# Patient Record
Sex: Female | Born: 1940 | Race: White | Hispanic: No | Smoking: Never smoker
Health system: Southern US, Community
[De-identification: ages and names within clinical notes are randomized; demographics above are authoritative.]

## PROBLEM LIST (undated history)

## (undated) DIAGNOSIS — J189 Pneumonia, unspecified organism: Secondary | ICD-10-CM

## (undated) DIAGNOSIS — C801 Malignant (primary) neoplasm, unspecified: Secondary | ICD-10-CM

## (undated) DIAGNOSIS — E041 Nontoxic single thyroid nodule: Secondary | ICD-10-CM

## (undated) DIAGNOSIS — I509 Heart failure, unspecified: Secondary | ICD-10-CM

## (undated) DIAGNOSIS — M1990 Unspecified osteoarthritis, unspecified site: Secondary | ICD-10-CM

## (undated) DIAGNOSIS — K625 Hemorrhage of anus and rectum: Secondary | ICD-10-CM

## (undated) DIAGNOSIS — F419 Anxiety disorder, unspecified: Secondary | ICD-10-CM

## (undated) DIAGNOSIS — I2721 Secondary pulmonary arterial hypertension: Secondary | ICD-10-CM

## (undated) DIAGNOSIS — J449 Chronic obstructive pulmonary disease, unspecified: Secondary | ICD-10-CM

## (undated) DIAGNOSIS — G4730 Sleep apnea, unspecified: Secondary | ICD-10-CM

## (undated) DIAGNOSIS — D649 Anemia, unspecified: Secondary | ICD-10-CM

## (undated) HISTORY — DX: Malignant (primary) neoplasm, unspecified: C80.1

## (undated) HISTORY — DX: Anemia, unspecified: D64.9

## (undated) HISTORY — DX: Heart failure, unspecified: I50.9

## (undated) HISTORY — DX: Hemorrhage of anus and rectum: K62.5

## (undated) HISTORY — DX: Nontoxic single thyroid nodule: E04.1

## (undated) HISTORY — PX: TUBAL LIGATION: SHX77

## (undated) HISTORY — PX: BASAL CELL CARCINOMA EXCISION: SHX1214

## (undated) HISTORY — DX: Secondary pulmonary arterial hypertension: I27.21

---
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
## 1961-06-05 HISTORY — PX: TONSILLECTOMY: SUR1361

## 1990-06-05 HISTORY — PX: PAROTID GLAND TUMOR EXCISION: SHX5221

## 1996-06-05 HISTORY — PX: CATARACT EXTRACTION: SUR2

## 2002-06-05 HISTORY — PX: REPLACEMENT TOTAL KNEE: SUR1224

## 2005-05-16 ENCOUNTER — Ambulatory Visit: Payer: Self-pay | Admitting: Internal Medicine

## 2006-01-27 ENCOUNTER — Ambulatory Visit: Payer: Self-pay | Admitting: Internal Medicine

## 2006-05-04 ENCOUNTER — Encounter: Payer: Self-pay | Admitting: Internal Medicine

## 2006-05-05 ENCOUNTER — Encounter: Payer: Self-pay | Admitting: Internal Medicine

## 2006-08-22 ENCOUNTER — Ambulatory Visit: Payer: Self-pay | Admitting: Internal Medicine

## 2006-11-12 ENCOUNTER — Ambulatory Visit: Payer: Self-pay | Admitting: Gastroenterology

## 2007-06-06 DIAGNOSIS — I2721 Secondary pulmonary arterial hypertension: Secondary | ICD-10-CM

## 2007-06-06 DIAGNOSIS — I509 Heart failure, unspecified: Secondary | ICD-10-CM

## 2007-06-06 HISTORY — DX: Secondary pulmonary arterial hypertension: I27.21

## 2007-06-06 HISTORY — DX: Heart failure, unspecified: I50.9

## 2007-08-29 ENCOUNTER — Ambulatory Visit: Payer: Self-pay | Admitting: Internal Medicine

## 2007-09-09 ENCOUNTER — Ambulatory Visit: Payer: Self-pay | Admitting: Internal Medicine

## 2007-10-07 ENCOUNTER — Ambulatory Visit: Payer: Self-pay

## 2007-11-19 ENCOUNTER — Ambulatory Visit: Payer: Self-pay | Admitting: General Practice

## 2008-01-10 ENCOUNTER — Other Ambulatory Visit: Payer: Self-pay

## 2008-01-10 ENCOUNTER — Inpatient Hospital Stay: Payer: Self-pay | Admitting: Internal Medicine

## 2008-03-01 ENCOUNTER — Emergency Department: Payer: Self-pay | Admitting: Emergency Medicine

## 2008-03-01 ENCOUNTER — Other Ambulatory Visit: Payer: Self-pay

## 2008-04-05 ENCOUNTER — Ambulatory Visit: Payer: Self-pay | Admitting: Internal Medicine

## 2008-04-07 ENCOUNTER — Ambulatory Visit: Payer: Self-pay | Admitting: Internal Medicine

## 2008-05-05 ENCOUNTER — Ambulatory Visit: Payer: Self-pay | Admitting: Internal Medicine

## 2008-06-05 ENCOUNTER — Ambulatory Visit: Payer: Self-pay | Admitting: Internal Medicine

## 2008-07-06 ENCOUNTER — Ambulatory Visit: Payer: Self-pay | Admitting: Internal Medicine

## 2008-08-03 ENCOUNTER — Ambulatory Visit: Payer: Self-pay | Admitting: Internal Medicine

## 2008-09-03 ENCOUNTER — Ambulatory Visit: Payer: Self-pay | Admitting: Internal Medicine

## 2008-10-03 ENCOUNTER — Ambulatory Visit: Payer: Self-pay | Admitting: Internal Medicine

## 2008-10-05 ENCOUNTER — Ambulatory Visit: Payer: Self-pay | Admitting: Internal Medicine

## 2008-11-03 ENCOUNTER — Ambulatory Visit: Payer: Self-pay | Admitting: Internal Medicine

## 2008-11-17 ENCOUNTER — Ambulatory Visit: Payer: Self-pay | Admitting: Internal Medicine

## 2008-12-03 ENCOUNTER — Ambulatory Visit: Payer: Self-pay | Admitting: Internal Medicine

## 2009-01-03 ENCOUNTER — Ambulatory Visit: Payer: Self-pay | Admitting: Internal Medicine

## 2009-01-12 ENCOUNTER — Ambulatory Visit: Payer: Self-pay | Admitting: Internal Medicine

## 2009-02-03 ENCOUNTER — Ambulatory Visit: Payer: Self-pay | Admitting: Internal Medicine

## 2009-04-05 ENCOUNTER — Ambulatory Visit: Payer: Self-pay | Admitting: Internal Medicine

## 2009-04-05 ENCOUNTER — Ambulatory Visit: Payer: Self-pay | Admitting: Unknown Physician Specialty

## 2009-04-20 ENCOUNTER — Ambulatory Visit: Payer: Self-pay | Admitting: Internal Medicine

## 2009-04-26 ENCOUNTER — Ambulatory Visit: Payer: Self-pay | Admitting: Unknown Physician Specialty

## 2009-04-26 ENCOUNTER — Ambulatory Visit: Payer: Self-pay | Admitting: Internal Medicine

## 2009-05-05 ENCOUNTER — Ambulatory Visit: Payer: Self-pay | Admitting: Internal Medicine

## 2009-05-11 ENCOUNTER — Ambulatory Visit: Payer: Self-pay | Admitting: Internal Medicine

## 2009-06-18 ENCOUNTER — Emergency Department: Payer: Self-pay

## 2009-07-06 ENCOUNTER — Ambulatory Visit: Payer: Self-pay | Admitting: Internal Medicine

## 2009-07-27 ENCOUNTER — Ambulatory Visit: Payer: Self-pay | Admitting: Internal Medicine

## 2009-08-03 ENCOUNTER — Ambulatory Visit: Payer: Self-pay | Admitting: Internal Medicine

## 2009-10-03 ENCOUNTER — Ambulatory Visit: Payer: Self-pay | Admitting: Internal Medicine

## 2009-10-25 ENCOUNTER — Ambulatory Visit: Payer: Self-pay | Admitting: Internal Medicine

## 2009-11-03 ENCOUNTER — Ambulatory Visit: Payer: Self-pay | Admitting: Internal Medicine

## 2009-11-04 ENCOUNTER — Ambulatory Visit: Payer: Self-pay | Admitting: General Practice

## 2009-11-17 ENCOUNTER — Inpatient Hospital Stay: Payer: Self-pay | Admitting: General Practice

## 2009-11-23 ENCOUNTER — Encounter: Payer: Self-pay | Admitting: Internal Medicine

## 2009-12-03 ENCOUNTER — Encounter: Payer: Self-pay | Admitting: Internal Medicine

## 2009-12-04 ENCOUNTER — Emergency Department: Payer: Self-pay | Admitting: Internal Medicine

## 2010-01-04 ENCOUNTER — Ambulatory Visit: Payer: Self-pay | Admitting: General Practice

## 2010-01-13 ENCOUNTER — Other Ambulatory Visit: Payer: Self-pay | Admitting: Specialist

## 2010-01-14 ENCOUNTER — Ambulatory Visit: Payer: Self-pay | Admitting: General Practice

## 2010-01-17 ENCOUNTER — Inpatient Hospital Stay: Payer: Self-pay | Admitting: General Practice

## 2010-01-18 LAB — PATHOLOGY REPORT

## 2010-05-06 ENCOUNTER — Ambulatory Visit: Payer: Self-pay | Admitting: Internal Medicine

## 2010-06-05 ENCOUNTER — Ambulatory Visit: Payer: Self-pay | Admitting: Internal Medicine

## 2010-06-05 HISTORY — PX: CATARACT EXTRACTION: SUR2

## 2010-07-06 ENCOUNTER — Ambulatory Visit: Payer: Self-pay | Admitting: Internal Medicine

## 2010-08-04 ENCOUNTER — Ambulatory Visit: Payer: Self-pay | Admitting: Internal Medicine

## 2010-09-04 ENCOUNTER — Ambulatory Visit: Payer: Self-pay | Admitting: Internal Medicine

## 2010-10-04 ENCOUNTER — Ambulatory Visit: Payer: Self-pay | Admitting: Internal Medicine

## 2010-10-11 ENCOUNTER — Ambulatory Visit: Payer: Self-pay | Admitting: Internal Medicine

## 2010-11-04 ENCOUNTER — Ambulatory Visit: Payer: Self-pay | Admitting: Internal Medicine

## 2010-12-04 ENCOUNTER — Ambulatory Visit: Payer: Self-pay | Admitting: Internal Medicine

## 2010-12-19 ENCOUNTER — Inpatient Hospital Stay: Payer: Self-pay | Admitting: Internal Medicine

## 2011-01-02 ENCOUNTER — Ambulatory Visit: Payer: Self-pay | Admitting: Ophthalmology

## 2011-01-16 ENCOUNTER — Ambulatory Visit: Payer: Self-pay | Admitting: Ophthalmology

## 2011-02-03 ENCOUNTER — Ambulatory Visit: Payer: Self-pay | Admitting: Unknown Physician Specialty

## 2011-02-08 LAB — PATHOLOGY REPORT

## 2011-06-06 DIAGNOSIS — K625 Hemorrhage of anus and rectum: Secondary | ICD-10-CM

## 2011-06-06 HISTORY — PX: LEG AMPUTATION: SHX1105

## 2011-06-06 HISTORY — DX: Hemorrhage of anus and rectum: K62.5

## 2011-10-18 ENCOUNTER — Ambulatory Visit: Payer: Self-pay | Admitting: Internal Medicine

## 2011-11-01 DIAGNOSIS — I890 Lymphedema, not elsewhere classified: Secondary | ICD-10-CM | POA: Insufficient documentation

## 2011-11-28 ENCOUNTER — Encounter: Payer: Self-pay | Admitting: Cardiothoracic Surgery

## 2011-12-04 ENCOUNTER — Encounter: Payer: Self-pay | Admitting: Cardiothoracic Surgery

## 2012-06-05 DIAGNOSIS — Z923 Personal history of irradiation: Secondary | ICD-10-CM

## 2012-06-05 HISTORY — PX: BREAST MAMMOSITE: SHX5264

## 2012-06-05 HISTORY — PX: BREAST SURGERY: SHX581

## 2012-06-05 HISTORY — DX: Personal history of irradiation: Z92.3

## 2012-11-01 ENCOUNTER — Emergency Department: Payer: Self-pay | Admitting: Unknown Physician Specialty

## 2012-11-01 LAB — COMPREHENSIVE METABOLIC PANEL
Anion Gap: 5 — ABNORMAL LOW (ref 7–16)
BUN: 20 mg/dL — ABNORMAL HIGH (ref 7–18)
Bilirubin,Total: 0.3 mg/dL (ref 0.2–1.0)
Co2: 31 mmol/L (ref 21–32)
EGFR (African American): >60
Glucose: 87 mg/dL (ref 65–99)
Potassium: 3.8 mmol/L (ref 3.5–5.1)
SGOT(AST): 18 U/L (ref 15–37)
SGPT (ALT): 14 U/L (ref 12–78)

## 2012-11-01 LAB — CBC
HCT: 26.8 % — ABNORMAL LOW (ref 35.0–47.0)
MCHC: 34.1 g/dL (ref 32.0–36.0)
WBC: 7.2 10*3/uL (ref 3.6–11.0)

## 2012-11-01 LAB — URINALYSIS, COMPLETE
Bilirubin,UR: NEGATIVE
Blood: NEGATIVE
Leukocyte Esterase: NEGATIVE
Nitrite: NEGATIVE
Protein: NEGATIVE
RBC,UR: <1 /HPF (ref 0–5)
Squamous Epithelial: 1

## 2012-11-06 ENCOUNTER — Emergency Department: Payer: Self-pay | Admitting: Emergency Medicine

## 2012-11-06 LAB — COMPREHENSIVE METABOLIC PANEL
BUN: 25 mg/dL — ABNORMAL HIGH (ref 7–18)
Bilirubin,Total: 0.3 mg/dL (ref 0.2–1.0)
Calcium, Total: 8.9 mg/dL (ref 8.5–10.1)
Chloride: 105 mmol/L (ref 98–107)
Co2: 31 mmol/L (ref 21–32)
Creatinine: 0.97 mg/dL (ref 0.60–1.30)
EGFR (African American): >60
Potassium: 4 mmol/L (ref 3.5–5.1)
SGOT(AST): 29 U/L (ref 15–37)
Sodium: 139 mmol/L (ref 136–145)

## 2012-11-06 LAB — CK TOTAL AND CKMB (NOT AT ARMC)
CK, Total: 40 U/L (ref 21–215)
CK-MB: 0.8 ng/mL (ref 0.5–3.6)

## 2012-11-06 LAB — CBC
HCT: 29.6 % — ABNORMAL LOW (ref 35.0–47.0)
MCV: 83 fL (ref 80–100)
Platelet: 290 10*3/uL (ref 150–440)
RBC: 3.57 10*6/uL — ABNORMAL LOW (ref 3.80–5.20)
RDW: 16.3 % — ABNORMAL HIGH (ref 11.5–14.5)
WBC: 9.3 10*3/uL (ref 3.6–11.0)

## 2012-11-06 LAB — PRO B NATRIURETIC PEPTIDE: B-Type Natriuretic Peptide: 465 pg/mL — ABNORMAL HIGH (ref 0–125)

## 2012-12-24 ENCOUNTER — Ambulatory Visit: Payer: Self-pay | Admitting: Internal Medicine

## 2012-12-30 ENCOUNTER — Ambulatory Visit: Payer: Self-pay | Admitting: Internal Medicine

## 2013-01-08 ENCOUNTER — Ambulatory Visit (INDEPENDENT_AMBULATORY_CARE_PROVIDER_SITE_OTHER): Payer: Medicare Other | Admitting: General Surgery

## 2013-01-08 ENCOUNTER — Encounter: Payer: Self-pay | Admitting: General Surgery

## 2013-01-08 VITALS — BP 144/82 | HR 86 | Resp 18 | Ht 60.0 in | Wt 204.0 lb

## 2013-01-08 DIAGNOSIS — N63 Unspecified lump in unspecified breast: Secondary | ICD-10-CM

## 2013-01-08 DIAGNOSIS — R928 Other abnormal and inconclusive findings on diagnostic imaging of breast: Secondary | ICD-10-CM

## 2013-01-08 DIAGNOSIS — C50919 Malignant neoplasm of unspecified site of unspecified female breast: Secondary | ICD-10-CM | POA: Insufficient documentation
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
## 2013-01-08 NOTE — Patient Instructions (Addendum)

## 2013-01-09 ENCOUNTER — Encounter: Payer: Self-pay | Admitting: General Surgery

## 2013-01-09 DIAGNOSIS — R928 Other abnormal and inconclusive findings on diagnostic imaging of breast: Secondary | ICD-10-CM | POA: Insufficient documentation

## 2013-01-10 ENCOUNTER — Telehealth: Payer: Self-pay | Admitting: General Surgery

## 2013-01-10 DIAGNOSIS — C50912 Malignant neoplasm of unspecified site of left female breast: Secondary | ICD-10-CM

## 2013-01-10 NOTE — Telephone Encounter (Signed)
Patient notified that the biopsy of the left breast completed August 6th identified a small breast cancer.  Arrangements made for sit down review of options on Wednesday, August 13th.

## 2013-01-13 ENCOUNTER — Encounter: Payer: Self-pay | Admitting: General Surgery

## 2013-01-14 LAB — HER-2 / NEU, FISH
HER-2/CEP17 Ratio: 1.11
Number of Observers:: 2

## 2013-01-14 LAB — ER/PR,IMMUNOHISTOCHEM,PARAFFIN: Estrogen Receptor IHC: >90 %

## 2013-01-15 ENCOUNTER — Encounter: Payer: Self-pay | Admitting: General Surgery

## 2013-01-15 ENCOUNTER — Ambulatory Visit (INDEPENDENT_AMBULATORY_CARE_PROVIDER_SITE_OTHER): Payer: Medicare Other | Admitting: General Surgery

## 2013-01-15 VITALS — BP 130/80 | HR 82 | Resp 20 | Ht 60.0 in | Wt 204.0 lb

## 2013-01-15 DIAGNOSIS — C50919 Malignant neoplasm of unspecified site of unspecified female breast: Secondary | ICD-10-CM | POA: Insufficient documentation

## 2013-01-15 LAB — PATHOLOGY

## 2013-01-15 NOTE — Patient Instructions (Signed)
Patient to have labs drawn at [facility].
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
## 2013-01-15 NOTE — Progress Notes (Signed)
Patient ID: Melissa Mcdonald, female   DOB: [birth year], 72 y.o.   MRN: [id number]  Patient to have the following labs drawn at [facility]: CEA and CA 27-29.

## 2013-01-16 ENCOUNTER — Telehealth: Payer: Self-pay | Admitting: *Deleted

## 2013-01-16 NOTE — Telephone Encounter (Signed)
Message copied by Currie Paris on Thu Jan 16, 2013  3:54 PM ------      Message from: [place], [place]      Created: Thu Jan 16, 2013  9:11 AM         Please notify the patient that labs are fine.  She should let us know how she would like to proceed.       ----- Message -----         From: [facility] Lab Results In Interface         Sent: 01/16/2013   5:48 AM           To: [name], MD                   ------

## 2013-01-16 NOTE — Telephone Encounter (Signed)
Notified patient as instructed, patient pleased. Patient has decided to have a left lumpectomy. States that the 25th is not good day but otherwise any day is good.

## 2013-01-20 ENCOUNTER — Telehealth: Payer: Self-pay | Admitting: *Deleted

## 2013-01-20 ENCOUNTER — Other Ambulatory Visit: Payer: Self-pay | Admitting: General Surgery

## 2013-01-20 DIAGNOSIS — C50912 Malignant neoplasm of unspecified site of left female breast: Secondary | ICD-10-CM

## 2013-01-20 NOTE — Telephone Encounter (Signed)
Patient's surgery has been scheduled for 01-30-13 at [facility]. She is aware of all instructions and will call the office if she has further questions.

## 2013-01-23 ENCOUNTER — Ambulatory Visit: Payer: Self-pay | Admitting: General Surgery

## 2013-01-30 ENCOUNTER — Ambulatory Visit: Payer: Self-pay | Admitting: General Surgery

## 2013-01-30 DIAGNOSIS — C50419 Malignant neoplasm of upper-outer quadrant of unspecified female breast: Secondary | ICD-10-CM

## 2013-01-30 DIAGNOSIS — C801 Malignant (primary) neoplasm, unspecified: Secondary | ICD-10-CM

## 2013-01-30 HISTORY — DX: Malignant (primary) neoplasm, unspecified: C80.1

## 2013-01-30 HISTORY — PX: BREAST EXCISIONAL BIOPSY: SUR124

## 2013-01-31 ENCOUNTER — Encounter: Payer: Self-pay | Admitting: General Surgery

## 2013-02-05 ENCOUNTER — Ambulatory Visit (INDEPENDENT_AMBULATORY_CARE_PROVIDER_SITE_OTHER): Payer: Medicare Other | Admitting: General Surgery

## 2013-02-05 ENCOUNTER — Encounter: Payer: Self-pay | Admitting: General Surgery

## 2013-02-05 ENCOUNTER — Ambulatory Visit: Payer: Self-pay | Admitting: Radiation Oncology

## 2013-02-05 VITALS — BP 140/84 | HR 80 | Resp 20 | Ht 61.0 in | Wt 204.0 lb

## 2013-02-05 DIAGNOSIS — C50912 Malignant neoplasm of unspecified site of left female breast: Secondary | ICD-10-CM

## 2013-02-05 DIAGNOSIS — C50919 Malignant neoplasm of unspecified site of unspecified female breast: Secondary | ICD-10-CM
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
## 2013-02-05 NOTE — Patient Instructions (Addendum)
Patient to return in 2 weeks for a follow up.   This patient has been scheduled for an appointment with Dr. Rushie Chestnut at the [facility] for 02-06-13 at 2 pm. Patient's daughter is aware of date, time, and instructions.

## 2013-02-06 ENCOUNTER — Ambulatory Visit: Payer: Self-pay | Admitting: Radiation Oncology

## 2013-02-06 ENCOUNTER — Encounter: Payer: Self-pay | Admitting: General Surgery

## 2013-02-07 ENCOUNTER — Encounter: Payer: Self-pay | Admitting: General Surgery

## 2013-02-07 ENCOUNTER — Telehealth: Payer: Self-pay

## 2013-02-07 MED ORDER — CEFADROXIL 500 MG PO CAPS: 500.0000 mg | ORAL_CAPSULE | Freq: Two times a day (BID) | ORAL | Status: DC

## 2013-02-07 NOTE — Addendum Note (Signed)
Addended by: [name] on: 02/07/2013 10:25 AM   Modules accepted: Orders

## 2013-02-07 NOTE — Telephone Encounter (Signed)
Message copied by Sinda Du on Fri Feb 07, 2013  8:45 AM ------      Message from: [place], [place]      Created: Fri Feb 07, 2013  7:20 AM         Please notify the patient that the pathology report showed all margins clear and lymph nodes negative. Will review in detail at follow up appointment.       ----- Message -----         From: Jena Gauss, CMA         Sent: 02/06/2013  11:33 AM           To: [name], MD                   ------

## 2013-02-07 NOTE — Telephone Encounter (Signed)
Notified patient of results as instructed, patient pleased. Discussed follow-up appointments, patient agrees

## 2013-02-11 ENCOUNTER — Telehealth: Payer: Self-pay | Admitting: *Deleted

## 2013-02-11 NOTE — Telephone Encounter (Signed)
Mammosite schedule reviewed with the patient Placement  02-12-13  at [facility] at 10:00 Scan 02-14-13 Treat 02-17-13 to 02-21-13 Aware Toniann Fail has already called her for more details Aware of [facility] and directions reviewed. Aware no showers and to wear her bra while mammosite in place. Concerned about how she is going to get in the bed at night because she uses a board and slides, states she will think about it, will trouble shoot using recliner or asking family to come at night to help. Pt agrees.

## 2013-02-12 ENCOUNTER — Encounter: Payer: Self-pay | Admitting: General Surgery

## 2013-02-12 ENCOUNTER — Ambulatory Visit (INDEPENDENT_AMBULATORY_CARE_PROVIDER_SITE_OTHER): Payer: Medicare Other | Admitting: General Surgery

## 2013-02-12 VITALS — BP 164/96 | HR 78 | Resp 18 | Ht 60.0 in | Wt 204.0 lb

## 2013-02-12 DIAGNOSIS — C50919 Malignant neoplasm of unspecified site of unspecified female breast: Secondary | ICD-10-CM

## 2013-02-12 NOTE — Patient Instructions (Addendum)
Patient care kit given to patient.  Instructed no showers, sponge bath while mammosite in place, take antibiotic. Follow up with [facility] as arranged. Discussed wearing your bra for support at all times.  Follow up at [facility] as scheduled

## 2013-02-12 NOTE — Progress Notes (Signed)
Patient here today for left breast mammosite placement.   The patient has met with the radiation oncologist and is felt to be a candidate for partial breast radiation. The procedure was reviewed. She had some concerns due to mobility issues, but I think that these will be easily managed during her treatment.   Physical examination:  Area of telangectasia between nipple and wide excision site in the upper outer quadrant 3x5 cm of the left breast. No evidence of inflammation or infection.  The procedure was reviewed. She was amenable to proceed. 10 cc of 0.5% Xylocaine with 0.25% Marcaine with 1-200,000 of epinephrine was utilized well tolerated. ChloraPrep was applied to the skin. The seroma cavity was drained of clear serous fluid without odor. The 4-5 cm trial balloon was inflated showing a good buffer. The treatment balloon was placed and inflated with 40 cc of saline/Omnipaque. The final distance between the skin and the balloon was 1.78 cm. Bacitracin was applied to the exit site followed by dry gauze dressing. The procedure was very well tolerated.  The patient will present to the radiation oncology Center for stimulation on Friday, September 12 as previously scheduled.

## 2013-02-14 ENCOUNTER — Encounter: Payer: Self-pay | Admitting: General Surgery

## 2013-02-18 ENCOUNTER — Ambulatory Visit: Payer: Medicare Other | Admitting: General Surgery

## 2013-02-19 ENCOUNTER — Encounter: Payer: Self-pay | Admitting: General Surgery

## 2013-03-05 ENCOUNTER — Ambulatory Visit: Payer: Self-pay | Admitting: Radiation Oncology

## 2013-03-18 ENCOUNTER — Ambulatory Visit (INDEPENDENT_AMBULATORY_CARE_PROVIDER_SITE_OTHER): Payer: Medicare Other | Admitting: General Surgery

## 2013-03-18 ENCOUNTER — Encounter: Payer: Self-pay | Admitting: General Surgery

## 2013-03-18 VITALS — BP 142/76 | HR 76 | Resp 18 | Ht 61.0 in | Wt 201.0 lb

## 2013-03-18 DIAGNOSIS — C50919 Malignant neoplasm of unspecified site of unspecified female breast: Secondary | ICD-10-CM

## 2013-03-18 DIAGNOSIS — C50912 Malignant neoplasm of unspecified site of left female breast: Secondary | ICD-10-CM

## 2013-03-18 MED ORDER — LETROZOLE 2.5 MG PO TABS: 2.5000 mg | ORAL_TABLET | Freq: Every day | ORAL | Status: DC

## 2013-03-18 NOTE — Patient Instructions (Addendum)
Patient to return in March 2015 left mammogram. RX sent .
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
## 2013-03-19 ENCOUNTER — Encounter: Payer: Self-pay | Admitting: General Surgery

## 2013-08-19 ENCOUNTER — Ambulatory Visit: Payer: Self-pay | Admitting: General Surgery

## 2013-08-20 ENCOUNTER — Encounter: Payer: Self-pay | Admitting: General Surgery

## 2013-08-20 ENCOUNTER — Ambulatory Visit: Payer: Self-pay | Admitting: Radiation Oncology

## 2013-08-27 ENCOUNTER — Ambulatory Visit: Payer: Medicare Other | Admitting: General Surgery

## 2013-09-02 ENCOUNTER — Encounter: Payer: Self-pay | Admitting: General Surgery

## 2013-09-02 ENCOUNTER — Ambulatory Visit (INDEPENDENT_AMBULATORY_CARE_PROVIDER_SITE_OTHER): Payer: Medicare Other | Admitting: General Surgery

## 2013-09-02 VITALS — BP 146/78 | HR 76 | Resp 16 | Ht 61.0 in | Wt 201.0 lb

## 2013-09-02 DIAGNOSIS — C50919 Malignant neoplasm of unspecified site of unspecified female breast: Secondary | ICD-10-CM

## 2013-09-02 DIAGNOSIS — C50912 Malignant neoplasm of unspecified site of left female breast: Secondary | ICD-10-CM
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
## 2013-09-02 NOTE — Patient Instructions (Signed)
Patient to return four months bilateral diagnotic mammogram.

## 2013-09-03 ENCOUNTER — Ambulatory Visit: Payer: Self-pay | Admitting: Radiation Oncology

## 2013-10-06 DIAGNOSIS — M1990 Unspecified osteoarthritis, unspecified site: Secondary | ICD-10-CM | POA: Insufficient documentation

## 2013-11-25 DIAGNOSIS — I340 Nonrheumatic mitral (valve) insufficiency: Secondary | ICD-10-CM | POA: Insufficient documentation

## 2013-11-25 DIAGNOSIS — K5730 Diverticulosis of large intestine without perforation or abscess without bleeding: Secondary | ICD-10-CM | POA: Insufficient documentation

## 2013-11-25 DIAGNOSIS — I10 Essential (primary) hypertension: Secondary | ICD-10-CM | POA: Insufficient documentation

## 2013-11-25 DIAGNOSIS — Z96659 Presence of unspecified artificial knee joint: Secondary | ICD-10-CM | POA: Insufficient documentation

## 2013-11-25 DIAGNOSIS — I2789 Other specified pulmonary heart diseases: Secondary | ICD-10-CM | POA: Insufficient documentation

## 2013-11-25 DIAGNOSIS — G589 Mononeuropathy, unspecified: Secondary | ICD-10-CM | POA: Insufficient documentation

## 2013-11-25 DIAGNOSIS — G43909 Migraine, unspecified, not intractable, without status migrainosus: Secondary | ICD-10-CM | POA: Insufficient documentation

## 2013-12-02 ENCOUNTER — Observation Stay: Payer: Self-pay | Admitting: Student

## 2013-12-02 LAB — COMPREHENSIVE METABOLIC PANEL
ALT: 16 U/L (ref 12–78)
AST: 18 U/L (ref 15–37)
Albumin: 3.4 g/dL (ref 3.4–5.0)
Alkaline Phosphatase: 80 U/L
Anion Gap: 3 — ABNORMAL LOW (ref 7–16)
BUN: 19 mg/dL — ABNORMAL HIGH (ref 7–18)
Bilirubin,Total: 0.4 mg/dL (ref 0.2–1.0)
Calcium, Total: 9 mg/dL (ref 8.5–10.1)
Chloride: 102 mmol/L (ref 98–107)
Co2: 33 mmol/L — ABNORMAL HIGH (ref 21–32)
Creatinine: 0.98 mg/dL (ref 0.60–1.30)
EGFR (Non-African Amer.): 58 — ABNORMAL LOW
Glucose: 91 mg/dL (ref 65–99)
Osmolality: 278 (ref 275–301)
Potassium: 3.5 mmol/L (ref 3.5–5.1)
SODIUM: 138 mmol/L (ref 136–145)
Total Protein: 7.1 g/dL (ref 6.4–8.2)

## 2013-12-02 LAB — CBC WITH DIFFERENTIAL/PLATELET
BASOS ABS: 0.1 10*3/uL (ref 0.0–0.1)
Basophil %: 0.8 %
Eosinophil #: 0.7 10*3/uL (ref 0.0–0.7)
Eosinophil %: 9.2 %
HCT: 28.5 % — AB (ref 35.0–47.0)
HGB: 9 g/dL — ABNORMAL LOW (ref 12.0–16.0)
LYMPHS ABS: 1.5 10*3/uL (ref 1.0–3.6)
Lymphocyte %: 19.8 %
MCH: 25.4 pg — AB (ref 26.0–34.0)
MCHC: 31.6 g/dL — AB (ref 32.0–36.0)
MCV: 81 fL (ref 80–100)
MONO ABS: 0.6 x10 3/mm (ref 0.2–0.9)
MONOS PCT: 7.5 %
NEUTROS ABS: 4.9 10*3/uL (ref 1.4–6.5)
NEUTROS PCT: 62.7 %
Platelet: 271 10*3/uL (ref 150–440)
RBC: 3.55 10*6/uL — ABNORMAL LOW (ref 3.80–5.20)
RDW: 17 % — ABNORMAL HIGH (ref 11.5–14.5)
WBC: 7.7 10*3/uL (ref 3.6–11.0)

## 2013-12-02 LAB — SEDIMENTATION RATE: Erythrocyte Sed Rate: 66 mm/hr — ABNORMAL HIGH (ref 0–30)

## 2013-12-03 ENCOUNTER — Encounter: Payer: Self-pay | Admitting: Internal Medicine

## 2013-12-03 HISTORY — PX: JOINT REPLACEMENT: SHX530

## 2013-12-03 LAB — BASIC METABOLIC PANEL
ANION GAP: 6 — AB (ref 7–16)
BUN: 20 mg/dL — ABNORMAL HIGH (ref 7–18)
Calcium, Total: 8.7 mg/dL (ref 8.5–10.1)
Chloride: 102 mmol/L (ref 98–107)
Co2: 33 mmol/L — ABNORMAL HIGH (ref 21–32)
Creatinine: 1.03 mg/dL (ref 0.60–1.30)
EGFR (Non-African Amer.): 54 — ABNORMAL LOW
GLUCOSE: 109 mg/dL — AB (ref 65–99)
Osmolality: 284 (ref 275–301)
Potassium: 3.6 mmol/L (ref 3.5–5.1)
SODIUM: 141 mmol/L (ref 136–145)

## 2013-12-03 LAB — CBC WITH DIFFERENTIAL/PLATELET
Basophil #: 0.1 10*3/uL (ref 0.0–0.1)
Basophil %: 0.8 %
EOS PCT: 10.7 %
Eosinophil #: 0.8 10*3/uL — ABNORMAL HIGH (ref 0.0–0.7)
HCT: 26.4 % — AB (ref 35.0–47.0)
HGB: 8.3 g/dL — ABNORMAL LOW (ref 12.0–16.0)
LYMPHS PCT: 20.9 %
Lymphocyte #: 1.5 10*3/uL (ref 1.0–3.6)
MCH: 25.7 pg — AB (ref 26.0–34.0)
MCHC: 31.6 g/dL — ABNORMAL LOW (ref 32.0–36.0)
MCV: 81 fL (ref 80–100)
MONOS PCT: 7.5 %
Monocyte #: 0.5 x10 3/mm (ref 0.2–0.9)
NEUTROS ABS: 4.4 10*3/uL (ref 1.4–6.5)
Neutrophil %: 60.1 %
PLATELETS: 239 10*3/uL (ref 150–440)
RBC: 3.25 10*6/uL — AB (ref 3.80–5.20)
RDW: 16.4 % — ABNORMAL HIGH (ref 11.5–14.5)
WBC: 7.4 10*3/uL (ref 3.6–11.0)

## 2013-12-03 LAB — FERRITIN: Ferritin (ARMC): 19 ng/mL (ref 8–388)

## 2013-12-03 LAB — IRON AND TIBC
IRON SATURATION: 11 %
Iron Bind.Cap.(Total): 306 ug/dL (ref 250–450)
Iron: 35 ug/dL — ABNORMAL LOW (ref 50–170)
Unbound Iron-Bind.Cap.: 271 ug/dL

## 2013-12-03 LAB — HEMOGLOBIN: HGB: 8.7 g/dL — ABNORMAL LOW (ref 12.0–16.0)

## 2013-12-04 LAB — HEMATOCRIT: HCT: 26.2 % — ABNORMAL LOW (ref 35.0–47.0)

## 2013-12-05 DIAGNOSIS — D509 Iron deficiency anemia, unspecified: Secondary | ICD-10-CM | POA: Insufficient documentation

## 2013-12-05 LAB — CBC WITH DIFFERENTIAL/PLATELET
BASOS ABS: 0 10*3/uL (ref 0.0–0.1)
BASOS PCT: 0.6 %
EOS ABS: 0.1 10*3/uL (ref 0.0–0.7)
EOS PCT: 1.1 %
HCT: 25.3 % — ABNORMAL LOW (ref 35.0–47.0)
HGB: 8.4 g/dL — ABNORMAL LOW (ref 12.0–16.0)
LYMPHS ABS: 1.6 10*3/uL (ref 1.0–3.6)
Lymphocyte %: 19.5 %
MCH: 26.3 pg (ref 26.0–34.0)
MCHC: 33.3 g/dL (ref 32.0–36.0)
MCV: 79 fL — AB (ref 80–100)
Monocyte #: 0.5 x10 3/mm (ref 0.2–0.9)
Monocyte %: 6.7 %
NEUTROS PCT: 72.1 %
Neutrophil #: 5.8 10*3/uL (ref 1.4–6.5)
Platelet: 236 10*3/uL (ref 150–440)
RBC: 3.2 10*6/uL — AB (ref 3.80–5.20)
RDW: 16.7 % — AB (ref 11.5–14.5)
WBC: 8.1 10*3/uL (ref 3.6–11.0)

## 2013-12-05 LAB — HEMOGLOBIN: HGB: 9.2 g/dL — ABNORMAL LOW (ref 12.0–16.0)

## 2013-12-07 ENCOUNTER — Ambulatory Visit (HOSPITAL_COMMUNITY)
Admission: AD | Admit: 2013-12-07 | Discharge: 2013-12-07 | Disposition: A | Discharge status: Home / Self Care | Payer: Self-pay | Source: Other Acute Inpatient Hospital | Attending: Internal Medicine | Admitting: Internal Medicine

## 2013-12-07 DIAGNOSIS — S82899A Other fracture of unspecified lower leg, initial encounter for closed fracture: Secondary | ICD-10-CM | POA: Insufficient documentation

## 2013-12-07 DIAGNOSIS — X58XXXA Exposure to other specified factors, initial encounter: Secondary | ICD-10-CM | POA: Insufficient documentation

## 2013-12-19 DIAGNOSIS — Z9889 Other specified postprocedural states: Secondary | ICD-10-CM | POA: Insufficient documentation

## 2014-01-20 ENCOUNTER — Ambulatory Visit: Payer: Medicare Other | Admitting: General Surgery

## 2014-03-30 ENCOUNTER — Encounter: Payer: Self-pay | Admitting: General Surgery

## 2014-03-30 ENCOUNTER — Ambulatory Visit: Payer: Self-pay | Admitting: General Surgery

## 2014-04-01 ENCOUNTER — Ambulatory Visit (INDEPENDENT_AMBULATORY_CARE_PROVIDER_SITE_OTHER): Payer: Medicare Other | Admitting: General Surgery

## 2014-04-01 ENCOUNTER — Encounter: Payer: Self-pay | Admitting: General Surgery

## 2014-04-01 VITALS — BP 132/92 | HR 82 | Resp 16

## 2014-04-01 DIAGNOSIS — C50912 Malignant neoplasm of unspecified site of left female breast: Secondary | ICD-10-CM
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
## 2014-04-01 NOTE — Patient Instructions (Addendum)
The patient is aware to call back for any questions or concerns. Continue self breast exams. Call office for any new breast issues or concerns. Follow up in one year with bilateral diagnostic mammograms and office visit.

## 2014-04-03 ENCOUNTER — Encounter: Payer: Self-pay | Admitting: General Surgery

## 2014-04-06 ENCOUNTER — Encounter: Payer: Self-pay | Admitting: General Surgery

## 2014-08-28 ENCOUNTER — Ambulatory Visit
Admit: 2014-08-28 | Disposition: A | Discharge status: Home / Self Care | Payer: Self-pay | Attending: Radiation Oncology | Admitting: Radiation Oncology

## 2014-09-04 ENCOUNTER — Ambulatory Visit
Admit: 2014-09-04 | Disposition: A | Discharge status: Home / Self Care | Payer: Self-pay | Attending: Radiation Oncology | Admitting: Radiation Oncology

## 2014-09-04 ENCOUNTER — Ambulatory Visit
Admit: 2014-09-04 | Disposition: A | Discharge status: Home / Self Care | Payer: Self-pay | Attending: Hematology and Oncology | Admitting: Hematology and Oncology

## 2014-09-17 LAB — IRON AND TIBC
Iron Bind.Cap.(Total): 349 (ref 250–450)
Iron Saturation: 8
Iron: 28 ug/dL
Unbound Iron-Bind.Cap.: 320.7

## 2014-09-17 LAB — COMPREHENSIVE METABOLIC PANEL
Albumin: 3.7 g/dL
Alkaline Phosphatase: 72 U/L
Anion Gap: 7 (ref 7–16)
BUN: 20 mg/dL
Bilirubin,Total: 0.6 mg/dL
Calcium, Total: 8.9 mg/dL
Chloride: 102 mmol/L
Co2: 29 mmol/L
Creatinine: 0.84 mg/dL
EGFR (African American): >60
EGFR (Non-African Amer.): >60
Glucose: 92 mg/dL
Potassium: 3.9 mmol/L
SGOT(AST): 16 U/L
SGPT (ALT): 12 U/L — ABNORMAL LOW
Sodium: 138 mmol/L
Total Protein: 6.9 g/dL

## 2014-09-17 LAB — CBC CANCER CENTER
Bands: 2 %
Eosinophil: 1 %
HCT: 33.4 % — ABNORMAL LOW (ref 35.0–47.0)
HGB: 11 g/dL — ABNORMAL LOW (ref 12.0–16.0)
Lymphocytes: 27 %
MCH: 28.2 pg (ref 26.0–34.0)
MCHC: 33 g/dL (ref 32.0–36.0)
MCV: 85 fL (ref 80–100)
Monocytes: 7 %
Platelet: 229 x10 3/mm (ref 150–440)
RBC: 3.91 10*6/uL (ref 3.80–5.20)
RDW: 15.3 % — ABNORMAL HIGH (ref 11.5–14.5)
Segmented Neutrophils: 63 %
WBC: 8.2 x10 3/mm (ref 3.6–11.0)

## 2014-09-17 LAB — SEDIMENTATION RATE: Erythrocyte Sed Rate: 56 mm/h — ABNORMAL HIGH

## 2014-09-17 LAB — RETICULOCYTES
Absolute Retic Count: 0.0579 10*6/uL (ref 0.019–0.186)
Reticulocyte: 1.48 % (ref 0.4–3.1)

## 2014-09-17 LAB — FERRITIN: Ferritin (ARMC): 15 ng/mL

## 2014-09-25 NOTE — Op Note (Signed)
PATIENT NAME:  Melissa Mcdonald, Melissa Mcdonald MR#:  [id number] DATE OF BIRTH:  02-19-1941  DATE OF PROCEDURE:  01/30/2013  PREOPERATIVE DIAGNOSIS: Left breast cancer.   POSTOPERATIVE DIAGNOSIS: Left breast cancer.    OPERATIVE PROCEDURE: Wide local excision, mastoplasty, sentinel node biopsy.   SURGEON: Robert Bellow, M.D.  ANESTHESIA: General endotracheal under Dr. Carolin Sicks, Marcaine 0.5% with 1: 200,000 units epinephrine 30 mL local infiltration.   CLINICAL NOTE:  This 74 year old woman was noticed to have a small nodular area in the left breast. 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 Given options for management, the patient desired breast conservation. She underwent injection with technetium sulfur colloid prior to the procedure. She is brought to the operating this time for planned wide excision and sentinel node biopsy.   OPERATIVE NOTE: With the patient under adequate general anesthesia, a total of 4 mL of a dilute mixture of methylene blue diluted 1:2 with normal saline was injected in the subareolar plexus. Ultrasound was used to confirm the location of the biopsy site in the one o'clock position of the left breast after prep with ChloraPrep and draping. Attention was turned to the axilla. The gamma finder showed increased uptake in the lower-third of the axilla. A transverse incision was made after instillation of local anesthesia. The skin and generous layer of adipose tissue was divided. Hemostasis achieved with electrocautery and 3-0 Vicryl ties. Two hot, non-blue lymph nodes were identified. Frozen section report showed no evidence of  macro metastatic disease as reported by Delorse Lek, M.D. While frozen section report was pending, attention was turned to the breast. The lesion was noted to be within 1 cm of the skin and it was elected to excise a 2 x 7 cm crescent of skin overlying the mass to allow the possibility of partial breast radiation and found appropriate. The skin  was incised sharply after the instillation of local anesthetic. Hemostasis was with electrocautery. The tissue was excised down to, but not including the underlying fascia. Specimen was orientated and specimen radiograph confirmed the previously placed clip in the center of the specimen. Gross examination by Dr. Luana Shu suggested tumor on ink at the inferior margin. An additional 1 cm of adipose tissue that appeared grossly normal was excised, orientated and sent fresh for formal examination. The adipose layer was divided approximately 1.5 cm below the skin and mobilized to allow easy approximation. The wound was closed in multiple layers with 2-0 Vicryl figure-of-eight sutures. The skin was then closed with a running 4-0 Vicryl subcuticular suture. A figure-of-eight suture was used in the axillary wound at the deep fascial level and then a running 4-0 Vicryl subcuticular suture for the skin. Dermabond was applied. Due to the patient's generous body habitus, a fluff gauze dressing followed by the application of her own bra was completed.   The patient tolerated the procedure well and was taken to the recovery room in stable condition.     ____________________________ Robert Bellow, MD jwb:nts D: 01/30/2013 18:27:02 ET T: 01/31/2013 03:12:28 ET JOB#: [id number]  cc: Robert Bellow, MD, <Dictator> Adin Hector, MD Matina Rodier Amedeo Kinsman MD ELECTRONICALLY SIGNED 01/31/2013 22:25

## 2014-09-25 NOTE — Consult Note (Signed)
Reason for Visit: This 74 year old Female patient presents to the clinic for initial evaluation of  breast cancer .   Referred by Dr. Hervey Ard.  Diagnosis:  Chief Complaint/Diagnosis   74 year old female with stage IA (T1 C. 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 Hervey Ard underwent needle localization positive for invasive mammary carcinoma strongly ER/PR positive HER-2/neu not overexpressed. A mammogram showing abnormality was performed on December 30, 2012. She underwent a wide local excision and sentinel node biopsy. 1.7 cm grade 2 overall invasive mammary carcinoma was seen. Margins were clear 2.5 cm for invasive component 0.5 mm for DCIS component. There were 5 lymph nodes examined. One had a micrometastatic focus. She is done well postoperatively. She has significant comorbidities including wheelchair-bound oxygen dependency. 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 She also has the significant history for congestive heart failure and pulmonary arterial hypertension. She seen today and for opinion regarding radiation therapy.  Past Hx:    PULMONARY HTN:    Sleep Apnea:    HTN:    Osteoarthritis:    lymphedema:    Diverticulitis:    Anemia:    CHF:    Migraines:    removal of antibiotic spacer, left knee revision: 17-Jan-2010   benign lump removed from neck:    basilcell removed:    Cataract  Extraction:    Tonsillectomy:    Appendectomy:    Knee Replacement - Bilat:   Past, Family and Social History:  Past Medical History positive   Cardiovascular congestive heart failure; hypertension   Respiratory pulmonary hypertension sleep apnea   Gastrointestinal diverticulitis   Neurological/Psychiatric migraine   Past Surgical History appendectomy; basal cell carcinoma removed, cataract extraction, tonsillectomy, 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   Past Medical History Comments osteoarthritis, lymphedema, anemia   Family History positive   Family History Comments mother with stroke and hypertension, father with chronic heart failure and lung disease, sister with ovarian cancer   Social History noncontributory   Allergies:   Aleve: Itching, Swelling  Iron (Ferrous Sulfate): N/V/Diarrhea  [place] Season[place]: N/V/Diarrhea  Protonix: Other  Home Meds:  Home Medications: Medication Instructions Status  acetaminophen-HYDROcodone 325 mg-5 mg oral tablet 1 tab(s) orally every 4 to 6 hours, As needed, pain Active  Lutein 10 mg oral capsule 1 cap(s) orally once a day (in the morning) Active  calcium carbonate 600 mg oral tablet 1 tab(s) orally 2 times a day Active  citalopram 40 mg oral tablet 1 tab(s) orally once a day (in the morning) Active  Fish Oil 1000 mg oral capsule 1 cap(s) orally once a day at lunchtime Active  folic acid 0.4 mg oral tablet 1 tab(s) orally once a day (in the morning) Active  gabapentin 300 mg oral capsule 1 cap(s) orally 3 times a day Active  Glucosamine Chondroitin MSM Complex 1 tab(s) orally once a day (in the morning) Active  ibuprofen 200 mg oral tablet 2 tab(s) orally 3 times a day Active  potassium  chloride 20 mEq oral tablet, extended release 1 tab(s) orally once a day (in the morning) Active  Vitamin B12 500 mcg oral tablet 1 tab(s) orally once a day (in the morning) Active  Vitamin C 500 mg oral tablet 1 tab(s) orally  once a day (in the morning) Active  vitamin E 200 intl units oral capsule 1 cap(s) orally once a day (in the morning) Active  Vitamin D3 1000 intl units oral tablet 1 tab(s) orally once a day (in the morning) Active  Oxygen @ 2L / M continuously  Active  Norco 325 mg-5 mg oral tablet 1-2 tablets by mouth every 4 hours as needed for pain  Active   Review of Systems:  General negative   Performance Status (ECOG) 1   Skin negative   Breast see HPI   Ophthalmologic negative   ENMT negative   Respiratory and Thorax see HPI   Cardiovascular see HPI   Gastrointestinal negative   Genitourinary negative   Musculoskeletal negative   Neurological negative   Psychiatric negative   Hematology/Lymphatics negative   Endocrine negative   Allergic/Immunologic negative   Physical Exam:  General/Skin/HEENT:  Skin normal   Eyes normal   ENMT normal   Head and Neck normal   Additional PE well-developed obese female wheelchair dependent on nasal oxygen. She status post wide local excision left breast which is healing well. Sentinel node incision is also healing well. No dominant mass or nodularity is noted in either breast into position examined. Lungs are clear to A&P cardiac examination shows regular rate and rhythm. She does have significant 2+ edema in her lower extremities bilaterally.   Breasts/Resp/CV/GI/GU:  Respiratory and Thorax normal   Cardiovascular normal   Gastrointestinal normal   Genitourinary normal   MS/Neuro/Psych/Lymph:  Musculoskeletal normal   Neurological normal   Lymphatics normal   Other Results:  Radiology Results: [place]:    28-Jul-14 15:45, US Breast Left  US Breast Left   REASON FOR EXAM:    AV LT NODULAR DENSITY  COMMENTS:       PROCEDURE: [place]  - US BREAST LEFT  - Dec 30 2012  3:45PM     RESULT: Ultrasound left breast in region of mammographic concern reveals   an ill-defined 39m lesion at 7:00 and a ill-defined 9 mm lesion at 8:00    in the left breast. Intramammary lymph nodes also noted in this region.   The 2 ill-defined solid lesions could represent malignancies and surgical   evaluation suggested.    IMPRESSION:  2 small subcentimeter ill-defined lesions upper outer aspect   of the left breast . These are solid and irregular and could represent   malignancy. There is adjacent benign intramammary lymph node. Surgical   evaluation these 2 lesions suggested.  BI-RADS: Category 4 - Suspicious Abnormality - Biopsy Should Be Considered      Thank you for the oppurtunity to contribute to the care of your patient. .        Verified By: TOsa Craver M.D., MD  LabUnknown:    28-Jul-14 14:44, Digital Additional Views Lt Breast ([facility]  PACS Image     28-Jul-14 15:45, [place]  P[place]    28-Jul-14 14:44, Digital Additional Views Lt Breast ([facility])  Digital Additional Views Lt Breast (SCR)   REASON FOR EXAM:    AV LT NODULAR DENSITY  COMMENTS:       PROCEDURE: MAM - MAM DGTL ADD VW LT  SCR  - Dec 30 2012  2:44PM     RESULT: Additional views left breast reveal a persistent area of   ill-defined densities in the upper outer aspect of the left breast   adjacent to what most likely represents a benign intramammary lymph node.   Ultrasound obtained reveals 2 subcentimeter irregular solid lesions in   this region. Surgical evaluation of these 2 lesions is suggested as they   are worrisome for malignancy.    IMPRESSION:  2 ill-defined subcentimeter lesions in the upper outer   aspect left breast which are solid and irregular by ultrasound. Surgical   evaluation suggested of these 2 lesions suggested as they're suspicious     for malignant.    BI-RADS: Category 4 - Suspicious Abnormality - Biopsy Should Be Considered      Thank you for the oppurtunity to contribute to the care of your patient.     BREAST COMPOSITION: The breast composition is SCATTERED FIBROGLANDULAR   TISSUE  (glandular tissue is 25-50%)        Verified By: Osa Craver, M.D., MD   Relevent Results:   Relevant Scans and Labs mammogram and ultrasound reviewed   Assessment and Plan: Impression:   stage IA invasive mammary carcinoma the left breast status post wide local excision and sentinel node biopsy in 74 year old female with significant comorbidities Plan:   even though there is a micrometastatic focus in one of the sentinel lymph nodes believe she is still stage IA disease based on classic staging. I also believe he would be significantly difficult for patient transportation and positioning to treat this patient with standard whole breast radiation. I have recommended and discussed both whole breast radiation and accelerated partial breast radiation. Patient is leaning towards partial breast irradiation based on the convenience and access to care. Would plan on delivering 3400 cGy in 10 fractions at 340 cGy twice a day using high-dose rate remote afterloading with a radium 192. Risks and benefits of accelerated partial breast irradiation were discussed with the patient and she seems to comprehend my treatm plan well. I have contacted Dr. Dellis Filbert Byrnett's office for placement of the MammoSite catheter and will set up treatment planning shortly thereafter.patient will also be a candidate for aromatase inhibitor therapy after completion of radiation.  I would like to take this opportunity to thank you for allowing me to continue to participate in this patient's care.  CC Referral:  cc: Dr. Hervey Ard, Dr. Ramonita Lab   Electronic Signatures: Baruch Gouty, Roda Shutters (MD)  (Signed 04-Sep-14 14:58)  Authored: HPI, Diagnosis, Past Hx, PFSH, Allergies, Home Meds, ROS, Physical Exam, Other Results, Relevent Results, Encounter Assessment and Plan, CC Referring Physician   Last Updated: 04-Sep-14 14:58 by Armstead Peaks (MD)

## 2014-09-26 NOTE — H&P (Signed)
PATIENT NAME:  Melissa Mcdonald, CUMPTON MR#:  [id number] DATE OF BIRTH:  Jul 23, 1940  DATE OF ADMISSION:  12/02/2013  PRIMARY CARE PHYSICIAN: Dr. Ramonita Lab at [facility].   PRIMARY ORTHOPEDIST: Dr. Lyndel Pleasure at [facility].   REFERRING PHYSICIAN: Dr. Conni Slipper.   CHIEF COMPLAINT: Knee pain.   HISTORY OF PRESENT ILLNESS: The patient is a pleasant 74 year old female with a history of bilateral knee replacements.  Of note, she is wheelchair bound. She, of note, also has had a left  above-knee amputation for bone infection per patient and daughter, who is in the room. Of note, the patient also has had increased knee issues on the right knee and was told by Dr. Marry Guan, apparently, that she is unable to bear weight as she is unstable on that knee. She had gone to see Dr. Lyndel Pleasure at [facility], who said the same. She was in a wheelchair mopping. She is on oxygen because of her pulmonary hypertension and chronic respiratory failure, and she states that the wheel from the wheelchair got stuck in her oxygen hosing and that she pushed strongly. She heard a popping sensation in her right knee and felt immediate pain. Came into the hospital where she was found to have dislocated right knee arthroplasty. Here, she was seen by Dr. Rudene Christians who recommended her to follow what her [facility]. [facility] was called and they stated, apparently, that the patient should be placed on a larger immobilizer and discharged home for outpatient follow up with them. She has required increased pain medications, and she is unable to take care of herself at home as she lives by herself.  We were asked to bring her in for observation and pain control.   PAST MEDICAL HISTORY:  1.  Hypertension. 2.  Chronic anemia, saw Dr. Inez Pilgrim in the past. 3.  Obstructive sleep apnea on CPAP. 4.  History of chronic respiratory failure due to pulmonary hypertension per patient. 5.  History of migraines. 6.  Osteoarthritis. 7.  History  of lymphedema of the lower extremities, status post AKA on the left; diverticulosis. 8.  History of moderate to severe mitral regurgitation. 9.  Pulmonary hypertension and probable diastolic congestive heart failure.  10.  History of right breast cancer.   PAST SURGICAL HISTORY:  1.  Bilateral knee replacement.  2.  Left AKA. 3.  Appendectomy. 4.  Tonsillectomy. 5.   Lump resection for parathyroid gland. 6.   Right cataract surgery   ALLERGIES: ALEVE, IRON, PROTONIX, OLD BAY SEASONING.   OUTPATIENT MEDICATIONS:  1.  Acetaminophen/hydrocodone 325/5 mg 1 tablet every 6 hours as needed for pain. 2.  Calcium carbonate 600 mg 2 times a day. 3.  Citalopram 40 mg daily. 4.  Echinacea oral tablet 350 mg once a day. 5.  Fish oil 1000 mg once a day. 6.  Folic acid 1 mg daily. 7.  Furosemide 80 mg once a day. 8.  Gabapentin 300 mg 3 times a day. 9.  Glucosamine chondroitin 1 tablet once a day. 10.  Ibuprofen 200 mg 2 tabs 3 times a day. 11.  Letrozole 2.5 mg once daily. 12.  Lutein 10 mg once a day.  13.  Magnesium oxide 250 mg once a day. 14.  Potassium chloride 20 mEq extended-release once a day. 15.  Vitamin B12 500 mcg daily. 16.  Vitamin C 500 mg daily. 17.  Vitamin D 3000 units 2 tablets once a day. 18.  Vitamin D 200 international units once a day.  SOCIAL HISTORY:  Lives by herself.  She is wheelchair bound. Divorced, retired. No smoking tobacco, alcohol or drug use.   FAMILY HISTORY: Dad had heart disease and was an alcoholic. Mom had stroke. Two brothers with diabetes. Sister with ovarian cancer.   REVIEW OF SYSTEMS:  CONSTITUTIONAL: Weight is steady. No fevers, has chills perhaps once a month. EYES:  No blurry vision or double vision.  ENT: No tinnitus or hearing loss.  RESPIRATORY: Has chronic respiratory failure, oxygen dependent. CARDIOVASCULAR: No chest pain or orthopnea. No swelling in the legs prior.  GASTROINTESTINAL: No nausea, vomiting, diarrhea, bloody stools,  or dark stools.  GENITOURINARY: Denies dysuria, hematuria. HEMATOLOGIC: Has chronic anemia.  SKIN: No rashes.  MUSCULOSKELETAL: Denies knee pain on the right. PSYCHOLOGIC: Denies anxiety or insomnia.  NEUROLOGIC: No focal weakness or numbness.   PHYSICAL EXAMINATION:  VITAL SIGNS: Temperature on arrival 98.1, pulse rate 57, respiratory rate 20, blood pressure 167/88, oxygen saturation 99% on room air, but actually, she was supposed to be on oxygen.  GENERAL:  Patient is an obese female lying in bed in no obvious distress.  HEENT: Normocephalic, atraumatic. Pupils are equal and reactive. Anicteric sclerae. Moist mucous membranes.  NECK: Supple. No thyroid tenderness. No cervical lymphadenopathy.  CARDIOVASCULAR: S1, S2 regular. No significant murmurs.  LUNGS: Clear anteriorly without wheezing or rhonchi.  ABDOMEN: Soft, nontender, nondistended. Positive bowel sounds in all quadrants.  EXTREMITIES: Left AKA and right lower extremities and immobilizer.  NEUROLOGIC: Moves upper extremities independently.  Face is symmetrical. Tongue protrudes midline.  PSYCHIATRIC: Awake, alert, oriented x 3. Pleasant and cooperative.  LABORATORIES AND IMAGING: Femoral x-ray on the right shows the above dislocated right knee arthroplasties discussed. Position and alignment are difficult to assess due to nonstandard projection and rotation.  Polyethylene spacer loose posterior to the distal femur, marked ostial lysis of the distal femur. Differential consideration includes chronic indolent infection or particles disease. Tib-fib on the right. Distal tibia and fibula appears intact. Please refer to the right femoral radiograph for description of complicated right knee arthroplasty.   White count is 7.7, ESR 60 segments, hemoglobin 9, and platelets 271,000. LFTs within normal limits. BUN 19, creatinine 0.98, sodium 138, potassium 3.5.   ASSESSMENT AND PLAN: We have a 74 year old obese, wheelchair dependent female  with chronic respiratory failure on chronic oxygen with right knee complicated dislocation, who we will be admitting for observation and pain management as she was unable to take care of herself due to above. At this point, Dr. Rudene Christians has evaluated the x-rays, and the recommendation was with [facility] transfer, but they have recommended discharge home with an immobilizer and they will see her as an outpatient. It looks like, at this point, she does have an appointment on Thursday at 2:00 per patient who just got a phone call for the above appointments. Will be admitting her for pain control. We will start her on morphine p.r.n., continue her Percocet, obtain a social work consult. The pain, at this time, appears to be controlled with the IV morphine. I would continue her Lasix, oxygen, and the CPAP. Blood pressure is a little up, but I suspect that is secondary to pain. She has chronic anemia, which appears to be at baseline. I would start her on heparin for deep vein thrombosis prophylaxis. The patient is FULL CODE.  TOTAL TIME SPENT: About 40 minutes.     ____________________________ Vivien Presto, MD sa:ts D: 12/02/2013 13:12:41 ET T: 12/02/2013 13:39:53 ET JOB#: [id number]  cc:  Vivien Presto, MD, <Dictator> Adin Hector, MD Vivien Presto MD ELECTRONICALLY SIGNED 12/03/2013 14:10

## 2014-09-26 NOTE — Discharge Summary (Signed)
PATIENT NAME:  Melissa Mcdonald, Melissa Mcdonald MR#:  [id number] DATE OF BIRTH:  21-May-1941  DATE OF ADMISSION:  12/03/2013 DATE OF TRANSFER:  12/07/2013  HISTORY OF PRESENT ILLNESS: The patient is a 74 year old white lady with a history of bilateral knee replacements. She is wheelchair bound as she has had a previous left AKA. The patient was mopping the floor from her wheelchair and felt something pop in her right knee. X-ray showed dislocation of her appliance. She was seen by Dr. Rudene Christians who felt that she should follow-up with her [facility] orthopedics; however, the patient was requiring increased pain medication and was unable to take care of herself at home. She did live alone. She was admitted, therefore, for further observation and pain control.   PAST MEDICAL HISTORY: Notable for: 1.  Hypertension.  2.  Chronic iron deficiency anemia. 3.  Obstructive sleep apnea on CPAP.  4.  History of pulmonary hypertension. 5.  History of migraines.  6.  Osteoarthritis.  7.  History of lymphedema of the lower extremities.  8.  Diverticulosis.  9.  History of severe mitral regurgitation.  10.  History of diastolic congestive heart failure. 11.  History of right breast cancer.   PAST SURGICAL HISTORY: Included: 1.  Bilateral knee replacements. 2.  Left AKA. 3.  Appendectomy.  4.  Tonsillectomy.  5.  Right cataract surgery.  6.  Lump resection of her parathyroid gland.   ALLERGIES: IRON ORALLY, ALEVE, PROTONIX, AND OLD BAY SEASONING.   MEDICATIONS:  At the time of admission: Vicodin 325/5 mg 1 tablet every 6 hours as needed, calcium carbonate 600 mg b.i.d., citalopram 40 mg daily, fish oil 5277 mg daily, folic acid 1 mg daily, furosemide 80 mg daily, gabapentin 300 mg t.i.d., glucosamine/chondroitin 1 tablet daily, ibuprofen 200 mg 2 tablets t.i.d., letrozole 2.5 mg daily, lutein 10 mg daily, Mag-Ox 250 mg daily, potassium chloride 20 mEq once daily, vitamin B12 500 mcg daily, vitamin C 500 mg daily, vitamin D 3000  units 2 tablets daily and Echinacea oral tablets 350 mg daily.   ADMISSION PHYSICAL EXAMINATION:  VITAL SIGNS:  As described by the admitting physician, revealed a temperature 98.1, a pulse rate 57, respiratory rate of 20, blood pressure 167/80, and a pulse oximetry of 99% on room air.  GENERAL: The patient was obese and lying in bed, but in no acute distress. CARDIOVASCULAR: Despite her cardiac history, she had no significant murmurs on exam.  LUNGS: Clear.  ABDOMEN: Soft and nontender.  EXTREMITIES: She was status post left AKA. The right leg was in an immobilizer.  NEUROLOGIC: Felt to be physiological.  PSYCHIATRIC: Grossly normal.   LABORATORY DATA:  The patient's CBC showed hemoglobin of 9 with a hematocrit of 28.5. White count was 7700. Platelet count was 271,000. Admission comprehensive metabolic panel was notable for a BUN of 19 with a creatinine of 0.98. CO2 was 33. Estimated GFR was 58. The remainder of the panel was unremarkable. Sedimentation rate on admission was 66. C-reactive protein was 12.9.   HOSPITAL COURSE: The patient was admitted to the regular medical floor where she was treated for her pain. She was seen in consultation by Dr. Inez Pilgrim because of her anemia. She did drop her hematocrit initially during her hospitalization, but did receive an iron transfusion. At the time of transfer, her hemoglobin was up to 9.2. Arrangements were made by the patient's PCP Dr. Caryl Comes to have the patient transferred to the service of Dr. Elder Negus on Sunday, July 5 for  proposed surgery on Monday July 6. At the time of transfer, the patient was felt to be medically stable.   TRANSFER DIAGNOSIS:  Dislocation of right knee appliance.   ____________________________ Hewitt Blade. Sarina Ser, MD jbw:ts D: 12/06/2013 18:36:45 ET T: 12/06/2013 19:34:20 ET JOB#: [id number]  cc: Jenny Reichmann B. Sarina Ser, MD, <Dictator> Lottie Mussel III MD ELECTRONICALLY SIGNED 12/11/2013 9:15

## 2014-09-26 NOTE — Discharge Summary (Signed)
PATIENT NAME:  Melissa Mcdonald, Melissa Mcdonald MR#:  [id number] DATE OF BIRTH:  1940-06-10  DATE OF ADMISSION:  12/03/2013 DATE OF TRANSFER:  12/07/2013  HISTORY OF PRESENT ILLNESS: The patient is a 74 year old white lady with a history of bilateral knee replacements. She is wheelchair bound as she has had a previous left AKA. The patient was mopping the floor from her wheelchair and felt something pop in her right knee. X-ray showed dislocation of her appliance. She was seen by Dr. Rudene Christians who felt that she should follow-up with her [facility] orthopedics; however, the patient was requiring increased pain medication and was unable to take care of herself at home. She did live alone. She was admitted, therefore, for further observation and pain control.   PAST MEDICAL HISTORY: Notable for: 1.  Hypertension.  2.  Chronic iron deficiency anemia. 3.  Obstructive sleep apnea on CPAP.  4.  History of pulmonary hypertension. 5.  History of migraines.  6.  Osteoarthritis.  7.  History of lymphedema of the lower extremities.  8.  Diverticulosis.  9.  History of severe mitral regurgitation.  10.  History of diastolic congestive heart failure. 11.  History of right breast cancer.   PAST SURGICAL HISTORY: Included: 1.  Bilateral knee replacements. 2.  Left AKA. 3.  Appendectomy.  4.  Tonsillectomy.  5.  Right cataract surgery.  6.  Lump resection of her parathyroid gland.   ALLERGIES: IRON ORALLY, ALEVE, PROTONIX, AND OLD BAY SEASONING.   MEDICATIONS:  At the time of admission: Vicodin 325/5 mg 1 tablet every 6 hours as needed, calcium carbonate 600 mg b.i.d., citalopram 40 mg daily, fish oil 5093 mg daily, folic acid 1 mg daily, furosemide 80 mg daily, gabapentin 300 mg t.i.d., glucosamine/chondroitin 1 tablet daily, ibuprofen 200 mg 2 tablets t.i.d., letrozole 2.5 mg daily, lutein 10 mg daily, Mag-Ox 250 mg daily, potassium chloride 20 mEq once daily, vitamin B12 500 mcg daily, vitamin C 500 mg daily, vitamin D 3000  units 2 tablets daily and Echinacea oral tablets 350 mg daily.   ADMISSION PHYSICAL EXAMINATION:  VITAL SIGNS:  As described by the admitting physician, revealed a temperature 98.1, a pulse rate 57, respiratory rate of 20, blood pressure 167/80, and a pulse oximetry of 99% on room air.  GENERAL: The patient was obese and lying in bed, but in no acute distress. CARDIOVASCULAR: Despite her cardiac history, she had no significant murmurs on exam.  LUNGS: Clear.  ABDOMEN: Soft and nontender.  EXTREMITIES: She was status post left AKA. The right leg was in an immobilizer.  NEUROLOGIC: Felt to be physiological.  PSYCHIATRIC: Grossly normal.   LABORATORY DATA:  The patient's CBC showed hemoglobin of 9 with a hematocrit of 28.5. White count was 7700. Platelet count was 271,000. Admission comprehensive metabolic panel was notable for a BUN of 19 with a creatinine of 0.98. CO2 was 33. Estimated GFR was 58. The remainder of the panel was unremarkable. Sedimentation rate on admission was 66. C-reactive protein was 12.9.   HOSPITAL COURSE: The patient was admitted to the regular medical floor where she was treated for her pain. She was seen in consultation by Dr. Inez Pilgrim because of her anemia. She did drop her hematocrit initially during her hospitalization, but did receive an iron transfusion. At the time of transfer, her hemoglobin was up to 9.2. Arrangements were made by the patient's PCP Dr. Caryl Comes to have the patient transferred to the service of Dr. Elder Negus on Sunday, July 5 for  proposed surgery on Monday July 6. At the time of transfer, the patient was felt to be medically stable.   TRANSFER DIAGNOSIS:  Dislocation of right knee appliance.   ____________________________ Hewitt Blade. Sarina Ser, MD jbw:ts D: 12/06/2013 18:36:45 ET T: 12/06/2013 19:34:20 ET JOB#: [id number]  cc: Jenny Reichmann B. Sarina Ser, MD, <Dictator> Lottie Mussel III MD ELECTRONICALLY SIGNED 12/11/2013 9:15

## 2014-09-26 NOTE — Consult Note (Signed)
Brief Consult Note: Diagnosis: periprosthetic fracture, kne dislocation.   Patient was seen by consultant.   Comments: Discussed with Dr Marry Guan, he recommended transfer to [facility] as there is nothing other than above knee amputation that can be done here for her.  Electronic Signatures: Laurene Footman (MD)  (Signed 30-Jun-15 16:08)  Authored: Brief Consult Note   Last Updated: 30-Jun-15 16:08 by Laurene Footman (MD)

## 2014-09-26 NOTE — Consult Note (Signed)
Brief Consult Note: Diagnosis: DISLOCATED KNEE, CHRONIC IRON DEFICIENCY.   Patient was seen by consultant.   Discussed with Attending MD.   Comments: PATIENT SEEN ON JULY 1, AGAIN TODAY.  DISCUSSED WITH DR Caryl Comes AND DR HOOTEN TODAY, HX CHRONIC IDA, PRIOR IRON REPLACEMENT, PRIOR LOCAL FLARE RX TO IV VENOFER. ALSO EARLY STAGE BREAST CANCER ON AROMATASE INHIBITOR. NO SUSPICION OF BREAST CANCER INVOLVEMENT. HGB SLIGHTLY BELOW PRIOR BASELINE. ASKED TO CONSIDER IV IRON. DISCUSSED WITH PATIENT. POTENTIAL RISK OF VEIN IRRITATION OR ALLEGIC OR SYSTEMIC RX, INCLUDING ALSO BUT NOT LIMITED TO ABDO PAIN, CRAMPING, BACK OR EXT PAIN, HEADACHE NAUSEA , VOMITING, AND MORE RARE BUT POSSIBLE ANAPHYLAXIS. BENEFIT OF BOOSTING HGB  WITH EARLIEST EFFECT IN A FEW DAY, SIGNIFICANT EFFECT WOULD TAKE AT LEAST A WEEK, POSSIBLE REDUCING TRANSFUSION REQUIREMENT IN PERI/POST OP TIME, POSSIBLE IMPROVE STRENGTH, PERFORMANCE, FACILITATE REHAB.Marland KitchenPATIENT WANTS IRON INFUSION.  WILL GIVE FERAHEME WITH PRE MEDS.   RE BREAST CANCER, PATIENT FOLLOWS WITH DR Bary Castilla AND DR Caryl Comes. RECOMMEND YEARLY BONE DENSITY DUE TO EFFECT OF AROMATASE INHIBITOR. IN THE FUTURE PATIENT CAN ALSO FRETURN TO CANCER CENTER PRN IF SHE WANTS TO F/U, AND GIVE MAINTAINANCE FERAHEME.... DICTATED NOTE TO FOLLOW.  Electronic Signatures: Dallas Schimke (MD)  (Signed 02-Jul-15 18:24)  Authored: Brief Consult Note   Last Updated: 02-Jul-15 18:24 by Dallas Schimke (MD)

## 2015-01-12 ENCOUNTER — Other Ambulatory Visit: Payer: Self-pay

## 2015-01-12 DIAGNOSIS — C50912 Malignant neoplasm of unspecified site of left female breast: Secondary | ICD-10-CM

## 2015-03-16 ENCOUNTER — Other Ambulatory Visit: Payer: Self-pay

## 2015-03-16 DIAGNOSIS — C50912 Malignant neoplasm of unspecified site of left female breast: Secondary | ICD-10-CM

## 2015-03-19 ENCOUNTER — Inpatient Hospital Stay: Payer: Medicare Other

## 2015-03-19 ENCOUNTER — Inpatient Hospital Stay: Payer: Medicare Other | Attending: Hematology and Oncology | Admitting: Hematology and Oncology

## 2015-03-19 VITALS — BP 172/85 | HR 64 | Temp 96.9°F | Resp 18

## 2015-03-19 DIAGNOSIS — C50912 Malignant neoplasm of unspecified site of left female breast: Secondary | ICD-10-CM | POA: Insufficient documentation

## 2015-03-19 DIAGNOSIS — Z170 Estrogen receptor positive status [ER+]: Secondary | ICD-10-CM | POA: Diagnosis not present

## 2015-03-19 DIAGNOSIS — I272 Other secondary pulmonary hypertension: Secondary | ICD-10-CM

## 2015-03-19 DIAGNOSIS — Z923 Personal history of irradiation: Secondary | ICD-10-CM | POA: Insufficient documentation

## 2015-03-19 DIAGNOSIS — Z79811 Long term (current) use of aromatase inhibitors: Secondary | ICD-10-CM

## 2015-03-19 DIAGNOSIS — Z8041 Family history of malignant neoplasm of ovary: Secondary | ICD-10-CM

## 2015-03-19 DIAGNOSIS — Z8719 Personal history of other diseases of the digestive system: Secondary | ICD-10-CM

## 2015-03-19 DIAGNOSIS — D509 Iron deficiency anemia, unspecified: Secondary | ICD-10-CM | POA: Diagnosis not present

## 2015-03-19 DIAGNOSIS — Z79899 Other long term (current) drug therapy: Secondary | ICD-10-CM | POA: Diagnosis not present

## 2015-03-19 DIAGNOSIS — I509 Heart failure, unspecified: Secondary | ICD-10-CM | POA: Diagnosis not present

## 2015-03-19 DIAGNOSIS — Z89612 Acquired absence of left leg above knee: Secondary | ICD-10-CM | POA: Insufficient documentation

## 2015-03-19 LAB — COMPREHENSIVE METABOLIC PANEL
ALT: 20 U/L (ref 14–54)
AST: 20 U/L (ref 15–41)
Albumin: 3.6 g/dL (ref 3.5–5.0)
Alkaline Phosphatase: 73 U/L (ref 38–126)
Anion gap: 5 (ref 5–15)
BUN: 19 mg/dL (ref 6–20)
CO2: 29 mmol/L (ref 22–32)
Calcium: 8.5 mg/dL — ABNORMAL LOW (ref 8.9–10.3)
Chloride: 102 mmol/L (ref 101–111)
Creatinine, Ser: 0.96 mg/dL (ref 0.44–1.00)
GFR calc Af Amer: >60 mL/min (ref >60)
GFR calc non Af Amer: 57 mL/min — ABNORMAL LOW (ref >60)
Glucose, Bld: 129 mg/dL — ABNORMAL HIGH (ref 65–99)
Potassium: 3.5 mmol/L (ref 3.5–5.1)
Sodium: 136 mmol/L (ref 135–145)
Total Bilirubin: 0.4 mg/dL (ref 0.3–1.2)
Total Protein: 6.5 g/dL (ref 6.5–8.1)

## 2015-03-19 LAB — IRON AND TIBC
Iron: 56 ug/dL (ref 28–170)
Saturation Ratios: 18 % (ref 10.4–31.8)
TIBC: 321 ug/dL (ref 250–450)
UIBC: 265 ug/dL

## 2015-03-19 LAB — RETICULOCYTES
RBC.: 3.97 MIL/uL (ref 3.80–5.20)
Retic Count, Absolute: 67.5 10*3/uL (ref 19.0–183.0)
Retic Ct Pct: 1.7 % (ref 0.4–3.1)

## 2015-03-19 LAB — SEDIMENTATION RATE: Sed Rate: 56 mm/hr — ABNORMAL HIGH (ref 0–30)

## 2015-03-19 LAB — CBC WITH DIFFERENTIAL/PLATELET
Basophils Absolute: 0.1 10*3/uL (ref 0–0.1)
Basophils Relative: 1 %
Eosinophils Absolute: 0.1 10*3/uL (ref 0–0.7)
Eosinophils Relative: 2 %
HCT: 35.1 % (ref 35.0–47.0)
Hemoglobin: 11.6 g/dL — ABNORMAL LOW (ref 12.0–16.0)
Lymphocytes Relative: 23 %
Lymphs Abs: 1.5 10*3/uL (ref 1.0–3.6)
MCH: 29.1 pg (ref 26.0–34.0)
MCHC: 33 g/dL (ref 32.0–36.0)
MCV: 88.3 fL (ref 80.0–100.0)
Monocytes Absolute: 0.5 10*3/uL (ref 0.2–0.9)
Monocytes Relative: 8 %
Neutro Abs: 4.5 10*3/uL (ref 1.4–6.5)
Neutrophils Relative %: 66 %
Platelets: 209 10*3/uL (ref 150–440)
RBC: 3.97 MIL/uL (ref 3.80–5.20)
RDW: 15 % — ABNORMAL HIGH (ref 11.5–14.5)
WBC: 6.7 10*3/uL (ref 3.6–11.0)

## 2015-03-19 LAB — FERRITIN: Ferritin: 15 ng/mL (ref 11–307)

## 2015-03-19 MED ORDER — ARIPIPRAZOLE 5 MG PO TABS: 5.0000 mg | ORAL_TABLET | Freq: Every day | ORAL | Status: DC

## 2015-03-19 MED ORDER — HYDROCODONE-ACETAMINOPHEN 5-325 MG PO TABS: 2.0000 | ORAL_TABLET | Freq: Four times a day (QID) | ORAL | Status: DC | PRN

## 2015-03-20 LAB — CANCER ANTIGEN 27.29: CA 27.29: 30.4 U/mL (ref 0.0–38.6)

## 2015-04-01 ENCOUNTER — Ambulatory Visit
Admission: RE | Admit: 2015-04-01 | Discharge: 2015-04-01 | Disposition: A | Discharge status: Home / Self Care | Payer: Medicare Other | Source: Ambulatory Visit | Attending: General Surgery | Admitting: General Surgery

## 2015-04-01 ENCOUNTER — Other Ambulatory Visit: Payer: Self-pay | Admitting: General Surgery

## 2015-04-01 ENCOUNTER — Ambulatory Visit
Admission: RE | Admit: 2015-04-01 | Discharge: 2015-04-01 | Disposition: A | Discharge status: Home / Self Care | Payer: Medicare Other | Source: Ambulatory Visit | Attending: Hematology and Oncology | Admitting: Hematology and Oncology

## 2015-04-01 DIAGNOSIS — Z853 Personal history of malignant neoplasm of breast: Secondary | ICD-10-CM | POA: Insufficient documentation

## 2015-04-01 DIAGNOSIS — C50912 Malignant neoplasm of unspecified site of left female breast: Secondary | ICD-10-CM

## 2015-04-13 ENCOUNTER — Ambulatory Visit: Payer: Medicaid Other | Admitting: General Surgery

## 2015-04-21 ENCOUNTER — Encounter: Payer: Self-pay | Admitting: General Surgery

## 2015-04-21 ENCOUNTER — Ambulatory Visit (INDEPENDENT_AMBULATORY_CARE_PROVIDER_SITE_OTHER): Payer: Medicare Other | Admitting: General Surgery

## 2015-04-21 VITALS — BP 134/72 | HR 72 | Resp 14 | Ht 61.0 in | Wt 242.0 lb

## 2015-04-21 DIAGNOSIS — C50912 Malignant neoplasm of unspecified site of left female breast: Secondary | ICD-10-CM | POA: Diagnosis not present
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
## 2015-04-21 NOTE — Patient Instructions (Signed)
The patient has been asked to return to the office in one year with a bilateral diagnostic mammogram. 

## 2015-06-16 ENCOUNTER — Other Ambulatory Visit: Payer: Self-pay

## 2015-06-16 DIAGNOSIS — C50912 Malignant neoplasm of unspecified site of left female breast: Secondary | ICD-10-CM

## 2015-06-16 DIAGNOSIS — D509 Iron deficiency anemia, unspecified: Secondary | ICD-10-CM

## 2015-06-18 ENCOUNTER — Other Ambulatory Visit: Payer: Self-pay

## 2015-06-18 ENCOUNTER — Inpatient Hospital Stay (HOSPITAL_BASED_OUTPATIENT_CLINIC_OR_DEPARTMENT_OTHER): Payer: Medicare Other | Admitting: Hematology and Oncology

## 2015-06-18 ENCOUNTER — Inpatient Hospital Stay: Payer: Medicare Other | Attending: Hematology and Oncology

## 2015-06-18 VITALS — BP 147/81 | HR 64 | Temp 98.6°F | Resp 18 | Ht 61.0 in

## 2015-06-18 DIAGNOSIS — C50912 Malignant neoplasm of unspecified site of left female breast: Secondary | ICD-10-CM

## 2015-06-18 DIAGNOSIS — Z85828 Personal history of other malignant neoplasm of skin: Secondary | ICD-10-CM | POA: Insufficient documentation

## 2015-06-18 DIAGNOSIS — Z79811 Long term (current) use of aromatase inhibitors: Secondary | ICD-10-CM

## 2015-06-18 DIAGNOSIS — Z89612 Acquired absence of left leg above knee: Secondary | ICD-10-CM | POA: Diagnosis not present

## 2015-06-18 DIAGNOSIS — I272 Other secondary pulmonary hypertension: Secondary | ICD-10-CM

## 2015-06-18 DIAGNOSIS — Z8719 Personal history of other diseases of the digestive system: Secondary | ICD-10-CM | POA: Diagnosis not present

## 2015-06-18 DIAGNOSIS — Z170 Estrogen receptor positive status [ER+]: Secondary | ICD-10-CM | POA: Diagnosis not present

## 2015-06-18 DIAGNOSIS — D509 Iron deficiency anemia, unspecified: Secondary | ICD-10-CM | POA: Diagnosis not present

## 2015-06-18 DIAGNOSIS — Z79899 Other long term (current) drug therapy: Secondary | ICD-10-CM

## 2015-06-18 DIAGNOSIS — Z8041 Family history of malignant neoplasm of ovary: Secondary | ICD-10-CM | POA: Insufficient documentation

## 2015-06-18 DIAGNOSIS — I509 Heart failure, unspecified: Secondary | ICD-10-CM | POA: Diagnosis not present

## 2015-06-18 LAB — CBC WITH DIFFERENTIAL/PLATELET
Basophils Absolute: 0 10*3/uL (ref 0–0.1)
Basophils Relative: 1 %
Eosinophils Absolute: 0.2 10*3/uL (ref 0–0.7)
Eosinophils Relative: 2 %
HCT: 33 % — ABNORMAL LOW (ref 35.0–47.0)
Hemoglobin: 10.9 g/dL — ABNORMAL LOW (ref 12.0–16.0)
Lymphocytes Relative: 24 %
Lymphs Abs: 1.9 10*3/uL (ref 1.0–3.6)
MCH: 29.2 pg (ref 26.0–34.0)
MCHC: 33.1 g/dL (ref 32.0–36.0)
MCV: 88.3 fL (ref 80.0–100.0)
Monocytes Absolute: 0.6 10*3/uL (ref 0.2–0.9)
Monocytes Relative: 8 %
Neutro Abs: 5.1 10*3/uL (ref 1.4–6.5)
Neutrophils Relative %: 65 %
Platelets: 212 10*3/uL (ref 150–440)
RBC: 3.73 MIL/uL — ABNORMAL LOW (ref 3.80–5.20)
RDW: 15.3 % — ABNORMAL HIGH (ref 11.5–14.5)
WBC: 7.8 10*3/uL (ref 3.6–11.0)

## 2015-06-18 LAB — IRON AND TIBC
Iron: 33 ug/dL (ref 28–170)
Saturation Ratios: 11 % (ref 10.4–31.8)
TIBC: 305 ug/dL (ref 250–450)
UIBC: 272 ug/dL

## 2015-06-18 LAB — FERRITIN: Ferritin: 17 ng/mL (ref 11–307)
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
## 2015-07-13 ENCOUNTER — Encounter: Payer: Self-pay | Admitting: Hematology and Oncology

## 2015-09-16 ENCOUNTER — Inpatient Hospital Stay: Payer: Medicare Other | Attending: Hematology and Oncology

## 2015-09-16 ENCOUNTER — Inpatient Hospital Stay (HOSPITAL_BASED_OUTPATIENT_CLINIC_OR_DEPARTMENT_OTHER): Payer: Medicare Other | Admitting: Hematology and Oncology

## 2015-09-16 VITALS — BP 129/80 | HR 58 | Temp 96.0°F | Resp 18

## 2015-09-16 DIAGNOSIS — D509 Iron deficiency anemia, unspecified: Secondary | ICD-10-CM

## 2015-09-16 DIAGNOSIS — C50412 Malignant neoplasm of upper-outer quadrant of left female breast: Secondary | ICD-10-CM | POA: Diagnosis not present

## 2015-09-16 DIAGNOSIS — I509 Heart failure, unspecified: Secondary | ICD-10-CM | POA: Diagnosis not present

## 2015-09-16 DIAGNOSIS — Z85828 Personal history of other malignant neoplasm of skin: Secondary | ICD-10-CM | POA: Insufficient documentation

## 2015-09-16 DIAGNOSIS — Z170 Estrogen receptor positive status [ER+]: Secondary | ICD-10-CM | POA: Diagnosis not present

## 2015-09-16 DIAGNOSIS — C50912 Malignant neoplasm of unspecified site of left female breast: Secondary | ICD-10-CM

## 2015-09-16 DIAGNOSIS — I272 Other secondary pulmonary hypertension: Secondary | ICD-10-CM | POA: Diagnosis not present

## 2015-09-16 DIAGNOSIS — Z79811 Long term (current) use of aromatase inhibitors: Secondary | ICD-10-CM | POA: Diagnosis not present

## 2015-09-16 DIAGNOSIS — Z79899 Other long term (current) drug therapy: Secondary | ICD-10-CM | POA: Insufficient documentation

## 2015-09-16 LAB — COMPREHENSIVE METABOLIC PANEL
ALT: 22 U/L (ref 14–54)
AST: 18 U/L (ref 15–41)
Albumin: 3.5 g/dL (ref 3.5–5.0)
Alkaline Phosphatase: 62 U/L (ref 38–126)
Anion gap: 4 — ABNORMAL LOW (ref 5–15)
BUN: 25 mg/dL — ABNORMAL HIGH (ref 6–20)
CO2: 31 mmol/L (ref 22–32)
Calcium: 8.6 mg/dL — ABNORMAL LOW (ref 8.9–10.3)
Chloride: 101 mmol/L (ref 101–111)
Creatinine, Ser: 1.04 mg/dL — ABNORMAL HIGH (ref 0.44–1.00)
GFR calc Af Amer: 60 mL/min — ABNORMAL LOW (ref >60)
GFR calc non Af Amer: 52 mL/min — ABNORMAL LOW (ref >60)
Glucose, Bld: 131 mg/dL — ABNORMAL HIGH (ref 65–99)
Potassium: 3.8 mmol/L (ref 3.5–5.1)
Sodium: 136 mmol/L (ref 135–145)
Total Bilirubin: 0.5 mg/dL (ref 0.3–1.2)
Total Protein: 6.5 g/dL (ref 6.5–8.1)

## 2015-09-16 LAB — CBC WITH DIFFERENTIAL/PLATELET
Basophils Absolute: 0.1 10*3/uL (ref 0–0.1)
Basophils Relative: 1 %
Eosinophils Absolute: 0.2 10*3/uL (ref 0–0.7)
Eosinophils Relative: 3 %
HCT: 33.2 % — ABNORMAL LOW (ref 35.0–47.0)
Hemoglobin: 11.3 g/dL — ABNORMAL LOW (ref 12.0–16.0)
Lymphocytes Relative: 24 %
Lymphs Abs: 1.7 10*3/uL (ref 1.0–3.6)
MCH: 29.9 pg (ref 26.0–34.0)
MCHC: 34.2 g/dL (ref 32.0–36.0)
MCV: 87.6 fL (ref 80.0–100.0)
Monocytes Absolute: 0.6 10*3/uL (ref 0.2–0.9)
Monocytes Relative: 8 %
Neutro Abs: 4.5 10*3/uL (ref 1.4–6.5)
Neutrophils Relative %: 64 %
Platelets: 232 10*3/uL (ref 150–440)
RBC: 3.79 MIL/uL — ABNORMAL LOW (ref 3.80–5.20)
RDW: 16.6 % — ABNORMAL HIGH (ref 11.5–14.5)
WBC: 7 10*3/uL (ref 3.6–11.0)

## 2015-09-16 LAB — FOLATE: Folate: 45 ng/mL (ref >5.9)

## 2015-09-16 LAB — FERRITIN: Ferritin: 21 ng/mL (ref 11–307)

## 2015-09-16 LAB — VITAMIN B12: Vitamin B-12: 613 pg/mL (ref 180–914)

## 2015-09-16 NOTE — Progress Notes (Signed)
States is feeling well. Offers no complaints. 
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
## 2015-09-17 LAB — CANCER ANTIGEN 27.29: CA 27.29: 44.9 U/mL — ABNORMAL HIGH (ref 0.0–38.6)

## 2015-09-19 ENCOUNTER — Encounter: Payer: Self-pay | Admitting: Hematology and Oncology

## 2015-09-20 ENCOUNTER — Telehealth: Payer: Self-pay

## 2015-09-20 NOTE — Telephone Encounter (Signed)
Called Pt per MD to report Ca 27.29 elevated slightly.  Pt coming in on 5/15 for a redraw.  No other concerns noted, pt verbalized an understanding

## 2015-10-18 ENCOUNTER — Inpatient Hospital Stay: Payer: Medicare Other | Attending: Hematology and Oncology

## 2015-10-18 DIAGNOSIS — Z170 Estrogen receptor positive status [ER+]: Secondary | ICD-10-CM | POA: Diagnosis not present

## 2015-10-18 DIAGNOSIS — C50912 Malignant neoplasm of unspecified site of left female breast: Secondary | ICD-10-CM | POA: Diagnosis present

## 2015-10-19 LAB — CANCER ANTIGEN 27.29: CA 27.29: 47.1 U/mL — ABNORMAL HIGH (ref 0.0–38.6)

## 2015-10-21 ENCOUNTER — Telehealth: Payer: Self-pay

## 2015-10-21 ENCOUNTER — Other Ambulatory Visit: Payer: Self-pay | Admitting: *Deleted

## 2015-10-21 DIAGNOSIS — C50912 Malignant neoplasm of unspecified site of left female breast: Secondary | ICD-10-CM

## 2015-10-21 DIAGNOSIS — R978 Other abnormal tumor markers: Secondary | ICD-10-CM

## 2015-10-21 NOTE — Telephone Encounter (Signed)
Called pt per MD.  CA 27.29 still elevated per MD she would like to know if she is in agreement with having a PET scan done.  Pt agrees. I informed pt scheduling would call to set up appt. No other concerns noted.

## 2015-10-28 ENCOUNTER — Ambulatory Visit: Payer: Medicare Other

## 2015-11-03 ENCOUNTER — Encounter
Admission: RE | Admit: 2015-11-03 | Discharge: 2015-11-03 | Disposition: A | Discharge status: Home / Self Care | Payer: Medicare Other | Source: Ambulatory Visit | Attending: Hematology and Oncology | Admitting: Hematology and Oncology

## 2015-11-03 DIAGNOSIS — K118 Other diseases of salivary glands: Secondary | ICD-10-CM | POA: Insufficient documentation

## 2015-11-03 DIAGNOSIS — C50912 Malignant neoplasm of unspecified site of left female breast: Secondary | ICD-10-CM | POA: Insufficient documentation

## 2015-11-03 DIAGNOSIS — R948 Abnormal results of function studies of other organs and systems: Secondary | ICD-10-CM | POA: Insufficient documentation

## 2015-11-03 DIAGNOSIS — R978 Other abnormal tumor markers: Secondary | ICD-10-CM | POA: Diagnosis present

## 2015-11-03 LAB — GLUCOSE, CAPILLARY: Glucose-Capillary: 97 mg/dL (ref 65–99)

## 2015-11-03 MED ORDER — FLUDEOXYGLUCOSE F - 18 (FDG) INJECTION
12.8400 | Freq: Once | INTRAVENOUS | Status: AC | PRN
  Administered 2015-11-03: 12.84 via INTRAVENOUS

## 2015-11-08 ENCOUNTER — Inpatient Hospital Stay: Payer: Medicare Other | Attending: Hematology and Oncology | Admitting: Hematology and Oncology

## 2015-11-08 ENCOUNTER — Telehealth: Payer: Self-pay | Admitting: *Deleted

## 2015-11-08 VITALS — BP 120/66 | HR 61 | Temp 97.7°F | Resp 18 | Wt 250.0 lb

## 2015-11-08 DIAGNOSIS — D509 Iron deficiency anemia, unspecified: Secondary | ICD-10-CM | POA: Diagnosis not present

## 2015-11-08 DIAGNOSIS — R948 Abnormal results of function studies of other organs and systems: Secondary | ICD-10-CM

## 2015-11-08 DIAGNOSIS — Z170 Estrogen receptor positive status [ER+]: Secondary | ICD-10-CM | POA: Diagnosis not present

## 2015-11-08 DIAGNOSIS — I272 Other secondary pulmonary hypertension: Secondary | ICD-10-CM | POA: Insufficient documentation

## 2015-11-08 DIAGNOSIS — Z79899 Other long term (current) drug therapy: Secondary | ICD-10-CM | POA: Insufficient documentation

## 2015-11-08 DIAGNOSIS — R220 Localized swelling, mass and lump, head: Secondary | ICD-10-CM | POA: Insufficient documentation

## 2015-11-08 DIAGNOSIS — Z89612 Acquired absence of left leg above knee: Secondary | ICD-10-CM | POA: Diagnosis not present

## 2015-11-08 DIAGNOSIS — R490 Dysphonia: Secondary | ICD-10-CM | POA: Diagnosis not present

## 2015-11-08 DIAGNOSIS — Z85828 Personal history of other malignant neoplasm of skin: Secondary | ICD-10-CM | POA: Diagnosis not present

## 2015-11-08 DIAGNOSIS — R590 Localized enlarged lymph nodes: Secondary | ICD-10-CM

## 2015-11-08 DIAGNOSIS — C50412 Malignant neoplasm of upper-outer quadrant of left female breast: Secondary | ICD-10-CM | POA: Diagnosis not present

## 2015-11-08 DIAGNOSIS — E041 Nontoxic single thyroid nodule: Secondary | ICD-10-CM | POA: Insufficient documentation

## 2015-11-08 DIAGNOSIS — Z8041 Family history of malignant neoplasm of ovary: Secondary | ICD-10-CM | POA: Insufficient documentation

## 2015-11-08 DIAGNOSIS — I509 Heart failure, unspecified: Secondary | ICD-10-CM | POA: Insufficient documentation

## 2015-11-08 DIAGNOSIS — Z79811 Long term (current) use of aromatase inhibitors: Secondary | ICD-10-CM | POA: Diagnosis not present

## 2015-11-08 DIAGNOSIS — C50912 Malignant neoplasm of unspecified site of left female breast: Secondary | ICD-10-CM

## 2015-11-08 DIAGNOSIS — Z8719 Personal history of other diseases of the digestive system: Secondary | ICD-10-CM | POA: Diagnosis not present

## 2015-11-08 DIAGNOSIS — K118 Other diseases of salivary glands: Secondary | ICD-10-CM

## 2015-11-08 NOTE — Telephone Encounter (Signed)
Called daughter [name] and told her that pt can have u/s on both parotid and a thyroid then proceed with bx of parotid.  The patient will [facility] 6[facility] 10 am arrival and 10:30 u/s and 11 am bx.  She will need to be NPO after midnight. She also needs blood work and I wil lcal [place] to see if they can collect it at [facility] and work out transportation per daughter she has to be transported by [facility]. I will cal lin am
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
## 2015-11-08 NOTE — Progress Notes (Signed)
States is feeling well today. Anxious about receiving results.

## 2015-11-09 ENCOUNTER — Encounter: Payer: Self-pay | Admitting: Hematology and Oncology

## 2015-11-11 ENCOUNTER — Telehealth: Payer: Self-pay

## 2015-11-11 NOTE — Telephone Encounter (Signed)
[facility] and spoke with Whitney about labs needed for pt to be able to have biopsy tomorrow.  Per [employer] she could not see them drawn today.  She was going to look in book and call me back.  At 1510 I called facility back and was placed on hold for 20 minutes no answer.  Hung up called back.  Spoke with Loree Fee again she stated she had to place the labs STAT because they were not done. She hopes they will be in before 5pm.

## 2015-11-12 ENCOUNTER — Ambulatory Visit
Admission: RE | Admit: 2015-11-12 | Discharge: 2015-11-12 | Disposition: A | Discharge status: Home / Self Care | Payer: Medicare Other | Source: Ambulatory Visit | Attending: Hematology and Oncology | Admitting: Hematology and Oncology

## 2015-11-12 DIAGNOSIS — K8020 Calculus of gallbladder without cholecystitis without obstruction: Secondary | ICD-10-CM | POA: Insufficient documentation

## 2015-11-12 DIAGNOSIS — C50912 Malignant neoplasm of unspecified site of left female breast: Secondary | ICD-10-CM | POA: Insufficient documentation

## 2015-11-12 DIAGNOSIS — E042 Nontoxic multinodular goiter: Secondary | ICD-10-CM | POA: Diagnosis not present

## 2015-11-12 DIAGNOSIS — R220 Localized swelling, mass and lump, head: Secondary | ICD-10-CM | POA: Diagnosis not present

## 2015-11-12 DIAGNOSIS — K4090 Unilateral inguinal hernia, without obstruction or gangrene, not specified as recurrent: Secondary | ICD-10-CM | POA: Diagnosis not present

## 2015-11-12 DIAGNOSIS — E041 Nontoxic single thyroid nodule: Secondary | ICD-10-CM | POA: Insufficient documentation

## 2015-11-12 MED ORDER — SODIUM CHLORIDE 0.9 % IV SOLN
INTRAVENOUS | Status: DC
  Administered 2015-11-12: 11:00:00 via INTRAVENOUS

## 2015-11-12 MED ORDER — FENTANYL CITRATE (PF) 100 MCG/2ML IJ SOLN
INTRAMUSCULAR | Status: AC | PRN
  Administered 2015-11-12: 50 ug via INTRAVENOUS

## 2015-11-12 MED ORDER — MIDAZOLAM HCL 2 MG/2ML IJ SOLN
INTRAMUSCULAR | Status: AC | PRN
  Administered 2015-11-12: 1 mg via INTRAVENOUS

## 2015-11-12 NOTE — Procedures (Signed)
L parotid mass Bx 18 g times two No comp/EBL

## 2015-11-12 NOTE — Discharge Instructions (Signed)
Needle Biopsy, Care After °Refer to this sheet in the next few weeks. These instructions provide you with information about caring for yourself after your procedure. Your health care provider may also give you more specific instructions. Your treatment has been planned according to current medical practices, but problems sometimes occur. Call your health care provider if you have any problems or questions after your procedure. °WHAT TO EXPECT AFTER THE PROCEDURE °After your procedure, it is common to have soreness, bruising, or mild pain at the biopsy site. This should go away in a few days. °HOME CARE INSTRUCTIONS °· Rest as directed by your health care provider. °· Take medicines only as directed by your health care provider. °· There are many different ways to close and cover the biopsy site, including stitches (sutures), skin glue, and adhesive strips. Follow your health care provider's instructions about: °¨ Biopsy site care. °¨ Bandage (dressing) changes and removal. °¨ Biopsy site closure removal. °· Check your biopsy site every day for signs of infection. Watch for: °¨ Redness, swelling, or pain. °¨ Fluid, blood, or pus. °SEEK MEDICAL CARE IF: °· You have a fever. °· You have redness, swelling, or pain at the biopsy site that lasts longer than a few days. °· You have fluid, blood, or pus coming from the biopsy site. °· You feel nauseous. °· You vomit. °SEEK IMMEDIATE MEDICAL CARE IF: °· You have shortness of breath. °· You have trouble breathing. °· You have chest pain.   °· You feel dizzy or you faint. °· You have bleeding that does not stop with pressure or a bandage. °· You cough up blood. °· You have pain in your abdomen. °  °This information is not intended to replace advice given to you by your health care provider. Make sure you discuss any questions you have with your health care provider. °  °Document Released: 10/06/2014 Document Reviewed: 10/06/2014 °Elsevier Interactive Patient Education ©2016  Elsevier Inc. ° °
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
## 2015-11-15 LAB — SURGICAL PATHOLOGY

## 2015-11-22 ENCOUNTER — Inpatient Hospital Stay (HOSPITAL_BASED_OUTPATIENT_CLINIC_OR_DEPARTMENT_OTHER): Payer: Medicare Other | Admitting: Hematology and Oncology

## 2015-11-22 ENCOUNTER — Encounter: Payer: Self-pay | Admitting: Hematology and Oncology

## 2015-11-22 VITALS — BP 114/71 | HR 72 | Temp 97.4°F | Resp 16 | Ht 60.0 in | Wt 254.0 lb

## 2015-11-22 DIAGNOSIS — D509 Iron deficiency anemia, unspecified: Secondary | ICD-10-CM

## 2015-11-22 DIAGNOSIS — Z8041 Family history of malignant neoplasm of ovary: Secondary | ICD-10-CM

## 2015-11-22 DIAGNOSIS — R220 Localized swelling, mass and lump, head: Secondary | ICD-10-CM

## 2015-11-22 DIAGNOSIS — Z79811 Long term (current) use of aromatase inhibitors: Secondary | ICD-10-CM | POA: Diagnosis not present

## 2015-11-22 DIAGNOSIS — C50412 Malignant neoplasm of upper-outer quadrant of left female breast: Secondary | ICD-10-CM | POA: Diagnosis not present

## 2015-11-22 DIAGNOSIS — I272 Other secondary pulmonary hypertension: Secondary | ICD-10-CM

## 2015-11-22 DIAGNOSIS — Z89612 Acquired absence of left leg above knee: Secondary | ICD-10-CM

## 2015-11-22 DIAGNOSIS — Z170 Estrogen receptor positive status [ER+]: Secondary | ICD-10-CM

## 2015-11-22 DIAGNOSIS — Z85828 Personal history of other malignant neoplasm of skin: Secondary | ICD-10-CM

## 2015-11-22 DIAGNOSIS — Z79899 Other long term (current) drug therapy: Secondary | ICD-10-CM

## 2015-11-22 DIAGNOSIS — R490 Dysphonia: Secondary | ICD-10-CM

## 2015-11-22 DIAGNOSIS — I509 Heart failure, unspecified: Secondary | ICD-10-CM

## 2015-11-22 DIAGNOSIS — Z8719 Personal history of other diseases of the digestive system: Secondary | ICD-10-CM

## 2015-11-22 DIAGNOSIS — K118 Other diseases of salivary glands: Secondary | ICD-10-CM

## 2015-11-22 DIAGNOSIS — E041 Nontoxic single thyroid nodule: Secondary | ICD-10-CM
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
## 2015-11-22 NOTE — Progress Notes (Signed)
Pt reports having a cough that sometimes she coughs up a small amount of yellow to clear thick mucous. SOB with cough. Reports she has a upper respiratory related to cold rooms at [facility].

## 2015-11-24 ENCOUNTER — Other Ambulatory Visit: Payer: Self-pay | Admitting: *Deleted

## 2015-11-24 DIAGNOSIS — R9389 Abnormal findings on diagnostic imaging of other specified body structures: Secondary | ICD-10-CM

## 2015-11-24 DIAGNOSIS — C50912 Malignant neoplasm of unspecified site of left female breast: Secondary | ICD-10-CM

## 2015-11-25 ENCOUNTER — Telehealth: Payer: Self-pay | Admitting: *Deleted

## 2015-11-25 NOTE — Telephone Encounter (Signed)
I called 6/21 at 5:15 and let Melissa Mcdonald , the pt's daughter to let her know that the date of bx 6[facility] for proc. At 11 am and daughter was agreeable with he date.  She wanted to know if she was going to get sedation or not and will she have a big cut.  i spoke to [place] in specials and she states with thyroid bx. She will get local numbing to the area and small fine gauged needle to get sample.  It has hardly any bleeding. Simple procedure and she will not see the spot that they stick.  Called daughter back and went over it with her and she will let her mom know.  I also made her an appt to come back to cancer center on 7/7 at 10:30.    I then called [facility] and spoke to [place] and gave her the info of bx date 6/30 10:30 to be at [facility] and she does not have to be NPO either.  Then her f/u appt will be 7/7 at 10:30 at cancer center.

## 2015-11-30 ENCOUNTER — Telehealth: Payer: Self-pay | Admitting: *Deleted

## 2015-11-30 NOTE — Telephone Encounter (Signed)
Called to report that she is scheduled for a thyroid bx Friday, but wanted you to be aware that she has pneumonia and is on abx

## 2015-11-30 NOTE — Telephone Encounter (Signed)
  Can we reschedule her thyroid biopsy until she is feeling better?  M

## 2015-12-03 ENCOUNTER — Ambulatory Visit
Admission: RE | Admit: 2015-12-03 | Discharge: 2015-12-03 | Disposition: A | Discharge status: Home / Self Care | Payer: Medicare Other | Source: Ambulatory Visit | Attending: Hematology and Oncology | Admitting: Hematology and Oncology

## 2015-12-03 DIAGNOSIS — E041 Nontoxic single thyroid nodule: Secondary | ICD-10-CM

## 2015-12-03 DIAGNOSIS — R946 Abnormal results of thyroid function studies: Secondary | ICD-10-CM | POA: Insufficient documentation

## 2015-12-03 DIAGNOSIS — C50912 Malignant neoplasm of unspecified site of left female breast: Secondary | ICD-10-CM | POA: Insufficient documentation

## 2015-12-03 DIAGNOSIS — R9389 Abnormal findings on diagnostic imaging of other specified body structures: Secondary | ICD-10-CM

## 2015-12-03 HISTORY — DX: Anxiety disorder, unspecified: F41.9

## 2015-12-03 HISTORY — DX: Pneumonia, unspecified organism: J18.9

## 2015-12-03 HISTORY — DX: Unspecified osteoarthritis, unspecified site: M19.90

## 2015-12-03 HISTORY — DX: Sleep apnea, unspecified: G47.30

## 2015-12-03 HISTORY — DX: Chronic obstructive pulmonary disease, unspecified: J44.9

## 2015-12-03 HISTORY — DX: Nontoxic single thyroid nodule: E04.1

## 2015-12-10 ENCOUNTER — Inpatient Hospital Stay: Payer: Medicare Other | Attending: Hematology and Oncology | Admitting: Hematology and Oncology

## 2015-12-10 ENCOUNTER — Ambulatory Visit: Payer: Medicare Other

## 2015-12-10 ENCOUNTER — Encounter: Payer: Self-pay | Admitting: Hematology and Oncology

## 2015-12-10 VITALS — BP 138/82 | HR 65 | Temp 98.6°F | Ht 60.0 in | Wt 250.0 lb

## 2015-12-10 DIAGNOSIS — J449 Chronic obstructive pulmonary disease, unspecified: Secondary | ICD-10-CM | POA: Diagnosis not present

## 2015-12-10 DIAGNOSIS — E041 Nontoxic single thyroid nodule: Secondary | ICD-10-CM | POA: Insufficient documentation

## 2015-12-10 DIAGNOSIS — Z170 Estrogen receptor positive status [ER+]: Secondary | ICD-10-CM | POA: Diagnosis not present

## 2015-12-10 DIAGNOSIS — Z923 Personal history of irradiation: Secondary | ICD-10-CM | POA: Diagnosis not present

## 2015-12-10 DIAGNOSIS — G4730 Sleep apnea, unspecified: Secondary | ICD-10-CM | POA: Diagnosis not present

## 2015-12-10 DIAGNOSIS — D509 Iron deficiency anemia, unspecified: Secondary | ICD-10-CM | POA: Diagnosis not present

## 2015-12-10 DIAGNOSIS — C50912 Malignant neoplasm of unspecified site of left female breast: Secondary | ICD-10-CM

## 2015-12-10 DIAGNOSIS — M1990 Unspecified osteoarthritis, unspecified site: Secondary | ICD-10-CM

## 2015-12-10 DIAGNOSIS — Z89612 Acquired absence of left leg above knee: Secondary | ICD-10-CM | POA: Diagnosis not present

## 2015-12-10 DIAGNOSIS — Z8041 Family history of malignant neoplasm of ovary: Secondary | ICD-10-CM | POA: Diagnosis not present

## 2015-12-10 DIAGNOSIS — K118 Other diseases of salivary glands: Secondary | ICD-10-CM

## 2015-12-10 DIAGNOSIS — Z79811 Long term (current) use of aromatase inhibitors: Secondary | ICD-10-CM | POA: Insufficient documentation

## 2015-12-10 DIAGNOSIS — C50412 Malignant neoplasm of upper-outer quadrant of left female breast: Secondary | ICD-10-CM | POA: Diagnosis not present

## 2015-12-10 DIAGNOSIS — I509 Heart failure, unspecified: Secondary | ICD-10-CM | POA: Diagnosis not present

## 2015-12-10 DIAGNOSIS — Z79899 Other long term (current) drug therapy: Secondary | ICD-10-CM | POA: Diagnosis not present

## 2015-12-10 DIAGNOSIS — R221 Localized swelling, mass and lump, neck: Secondary | ICD-10-CM | POA: Insufficient documentation

## 2015-12-10 DIAGNOSIS — I272 Other secondary pulmonary hypertension: Secondary | ICD-10-CM | POA: Insufficient documentation

## 2015-12-10 DIAGNOSIS — F419 Anxiety disorder, unspecified: Secondary | ICD-10-CM | POA: Insufficient documentation

## 2015-12-10 LAB — CBC WITH DIFFERENTIAL/PLATELET
Basophils Absolute: 0 10*3/uL (ref 0–0.1)
Basophils Relative: 1 %
Eosinophils Absolute: 0.2 10*3/uL (ref 0–0.7)
Eosinophils Relative: 2 %
HCT: 34.7 % — ABNORMAL LOW (ref 35.0–47.0)
Hemoglobin: 11.8 g/dL — ABNORMAL LOW (ref 12.0–16.0)
Lymphocytes Relative: 22 %
Lymphs Abs: 1.8 10*3/uL (ref 1.0–3.6)
MCH: 30.9 pg (ref 26.0–34.0)
MCHC: 34.1 g/dL (ref 32.0–36.0)
MCV: 90.7 fL (ref 80.0–100.0)
Monocytes Absolute: 0.7 10*3/uL (ref 0.2–0.9)
Monocytes Relative: 9 %
Neutro Abs: 5.5 10*3/uL (ref 1.4–6.5)
Neutrophils Relative %: 66 %
Platelets: 208 10*3/uL (ref 150–440)
RBC: 3.83 MIL/uL (ref 3.80–5.20)
RDW: 15.4 % — ABNORMAL HIGH (ref 11.5–14.5)
WBC: 8.2 10*3/uL (ref 3.6–11.0)

## 2015-12-10 LAB — IRON AND TIBC
Iron: 49 ug/dL (ref 28–170)
Saturation Ratios: 18 % (ref 10.4–31.8)
TIBC: 276 ug/dL (ref 250–450)
UIBC: 227 ug/dL

## 2015-12-10 LAB — FERRITIN: Ferritin: 41 ng/mL (ref 11–307)

## 2015-12-10 NOTE — Progress Notes (Signed)
Patient here for biopsy results. Patient was diagnosed with pneumonia approx. 10 days ago she has completed her abt.
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
## 2015-12-16 ENCOUNTER — Ambulatory Visit: Payer: Medicare Other

## 2015-12-16 ENCOUNTER — Ambulatory Visit: Payer: Medicare Other | Admitting: Hematology and Oncology

## 2015-12-16 ENCOUNTER — Other Ambulatory Visit: Payer: Medicare Other

## 2015-12-31 ENCOUNTER — Encounter: Payer: Self-pay | Admitting: Interventional Radiology

## 2015-12-31 LAB — CYTOLOGY - NON PAP

## 2016-01-03 ENCOUNTER — Other Ambulatory Visit: Payer: Self-pay | Admitting: Otolaryngology

## 2016-01-03 DIAGNOSIS — J3800 Paralysis of vocal cords and larynx, unspecified: Secondary | ICD-10-CM

## 2016-01-07 ENCOUNTER — Ambulatory Visit: Admission: RE | Admit: 2016-01-07 | Payer: Medicare Other | Source: Ambulatory Visit

## 2016-01-07 ENCOUNTER — Ambulatory Visit
Admission: RE | Admit: 2016-01-07 | Discharge: 2016-01-07 | Disposition: A | Discharge status: Home / Self Care | Payer: Medicare Other | Source: Ambulatory Visit | Attending: Otolaryngology | Admitting: Otolaryngology

## 2016-01-07 DIAGNOSIS — J3800 Paralysis of vocal cords and larynx, unspecified: Secondary | ICD-10-CM | POA: Diagnosis not present

## 2016-01-07 DIAGNOSIS — E079 Disorder of thyroid, unspecified: Secondary | ICD-10-CM | POA: Diagnosis present

## 2016-01-07 LAB — POCT I-STAT CREATININE: CREATININE: 1 mg/dL (ref 0.44–1.00)

## 2016-01-07 MED ORDER — IOPAMIDOL (ISOVUE-300) INJECTION 61%
75.0000 mL | Freq: Once | INTRAVENOUS | Status: AC | PRN
  Administered 2016-01-07: 75 mL via INTRAVENOUS

## 2016-01-17 ENCOUNTER — Other Ambulatory Visit: Payer: Self-pay | Admitting: Cardiology

## 2016-01-17 DIAGNOSIS — R0789 Other chest pain: Secondary | ICD-10-CM

## 2016-01-19 ENCOUNTER — Ambulatory Visit
Admission: RE | Admit: 2016-01-19 | Discharge: 2016-01-19 | Disposition: A | Discharge status: Home / Self Care | Payer: Medicare Other | Source: Ambulatory Visit | Attending: Cardiology | Admitting: Cardiology

## 2016-01-19 DIAGNOSIS — R0789 Other chest pain: Secondary | ICD-10-CM | POA: Insufficient documentation

## 2016-01-19 MED ORDER — REGADENOSON 0.4 MG/5ML IV SOLN
0.4000 mg | Freq: Once | INTRAVENOUS | Status: AC
  Administered 2016-01-19: 0.4 mg via INTRAVENOUS

## 2016-01-19 MED ORDER — TECHNETIUM TC 99M TETROFOSMIN IV KIT
13.6700 | PACK | Freq: Once | INTRAVENOUS | Status: AC | PRN
  Administered 2016-01-19: 13.67 via INTRAVENOUS

## 2016-01-19 MED ORDER — TECHNETIUM TC 99M TETROFOSMIN IV KIT
30.0000 | PACK | Freq: Once | INTRAVENOUS | Status: AC | PRN
  Administered 2016-01-19: 29.69 via INTRAVENOUS

## 2016-01-21 LAB — NM MYOCAR MULTI W/SPECT W/WALL MOTION / EF
CHL CUP NUCLEAR SRS: 3
CHL CUP STRESS STAGE 1 SPEED: 0 mph
CHL CUP STRESS STAGE 2 GRADE: 0 %
CHL CUP STRESS STAGE 2 HR: 66 {beats}/min
CHL CUP STRESS STAGE 2 SPEED: 0 mph
CHL CUP STRESS STAGE 3 GRADE: 0 %
CHL CUP STRESS STAGE 4 HR: 87 {beats}/min
CHL CUP STRESS STAGE 4 SPEED: 0 mph
CHL CUP STRESS STAGE 5 DBP: 60 mmHg
CHL CUP STRESS STAGE 5 GRADE: 0 %
CSEPEDS: 0 s
CSEPHR: 61 %
CSEPPMHR: 60 %
Estimated workload: 1 METS
Exercise duration (min): 1 min
LVDIAVOL: 108 mL (ref 46–106)
LVSYSVOL: 41 mL
MPHR: 146 {beats}/min
NUC STRESS TID: 0.94
Peak HR: 89 {beats}/min
Rest HR: 67 {beats}/min
SDS: 4
SSS: 5
Stage 1 Grade: 0 %
Stage 1 HR: 66 {beats}/min
Stage 3 HR: 89 {beats}/min
Stage 3 Speed: 0 mph
Stage 4 Grade: 0 %
Stage 5 HR: 83 {beats}/min
Stage 5 SBP: 165 mmHg
Stage 5 Speed: 0 mph

## 2016-02-02 ENCOUNTER — Other Ambulatory Visit: Payer: Self-pay | Admitting: General Surgery

## 2016-02-02 DIAGNOSIS — C50912 Malignant neoplasm of unspecified site of left female breast: Secondary | ICD-10-CM

## 2016-02-16 ENCOUNTER — Encounter: Payer: Self-pay | Admitting: *Deleted

## 2016-03-17 ENCOUNTER — Inpatient Hospital Stay (HOSPITAL_BASED_OUTPATIENT_CLINIC_OR_DEPARTMENT_OTHER): Payer: Medicare Other | Admitting: Hematology and Oncology

## 2016-03-17 ENCOUNTER — Other Ambulatory Visit: Payer: Self-pay | Admitting: *Deleted

## 2016-03-17 ENCOUNTER — Inpatient Hospital Stay: Payer: Medicare Other | Attending: Hematology and Oncology

## 2016-03-17 VITALS — BP 152/69 | HR 61 | Temp 97.2°F | Resp 18 | Wt 246.0 lb

## 2016-03-17 DIAGNOSIS — I509 Heart failure, unspecified: Secondary | ICD-10-CM

## 2016-03-17 DIAGNOSIS — E041 Nontoxic single thyroid nodule: Secondary | ICD-10-CM

## 2016-03-17 DIAGNOSIS — Z89512 Acquired absence of left leg below knee: Secondary | ICD-10-CM | POA: Diagnosis not present

## 2016-03-17 DIAGNOSIS — D509 Iron deficiency anemia, unspecified: Secondary | ICD-10-CM | POA: Diagnosis not present

## 2016-03-17 DIAGNOSIS — Z170 Estrogen receptor positive status [ER+]: Secondary | ICD-10-CM

## 2016-03-17 DIAGNOSIS — F419 Anxiety disorder, unspecified: Secondary | ICD-10-CM | POA: Diagnosis not present

## 2016-03-17 DIAGNOSIS — I2721 Secondary pulmonary arterial hypertension: Secondary | ICD-10-CM

## 2016-03-17 DIAGNOSIS — Z8701 Personal history of pneumonia (recurrent): Secondary | ICD-10-CM

## 2016-03-17 DIAGNOSIS — K921 Melena: Secondary | ICD-10-CM | POA: Diagnosis not present

## 2016-03-17 DIAGNOSIS — C50412 Malignant neoplasm of upper-outer quadrant of left female breast: Secondary | ICD-10-CM | POA: Diagnosis not present

## 2016-03-17 DIAGNOSIS — Z8041 Family history of malignant neoplasm of ovary: Secondary | ICD-10-CM | POA: Insufficient documentation

## 2016-03-17 DIAGNOSIS — Z79811 Long term (current) use of aromatase inhibitors: Secondary | ICD-10-CM

## 2016-03-17 DIAGNOSIS — Z79899 Other long term (current) drug therapy: Secondary | ICD-10-CM | POA: Insufficient documentation

## 2016-03-17 DIAGNOSIS — J449 Chronic obstructive pulmonary disease, unspecified: Secondary | ICD-10-CM | POA: Diagnosis not present

## 2016-03-17 DIAGNOSIS — Z9012 Acquired absence of left breast and nipple: Secondary | ICD-10-CM | POA: Diagnosis not present

## 2016-03-17 DIAGNOSIS — Z923 Personal history of irradiation: Secondary | ICD-10-CM | POA: Insufficient documentation

## 2016-03-17 DIAGNOSIS — G4730 Sleep apnea, unspecified: Secondary | ICD-10-CM | POA: Insufficient documentation

## 2016-03-17 DIAGNOSIS — C50912 Malignant neoplasm of unspecified site of left female breast: Secondary | ICD-10-CM

## 2016-03-17 LAB — COMPREHENSIVE METABOLIC PANEL
ALT: 18 U/L (ref 14–54)
AST: 19 U/L (ref 15–41)
Albumin: 3.7 g/dL (ref 3.5–5.0)
Alkaline Phosphatase: 57 U/L (ref 38–126)
Anion gap: 9 (ref 5–15)
BUN: 22 mg/dL — ABNORMAL HIGH (ref 6–20)
CO2: 26 mmol/L (ref 22–32)
Calcium: 9.1 mg/dL (ref 8.9–10.3)
Chloride: 101 mmol/L (ref 101–111)
Creatinine, Ser: 0.89 mg/dL (ref 0.44–1.00)
GFR calc Af Amer: >60 mL/min (ref >60)
GFR calc non Af Amer: >60 mL/min (ref >60)
Glucose, Bld: 169 mg/dL — ABNORMAL HIGH (ref 65–99)
Potassium: 4 mmol/L (ref 3.5–5.1)
Sodium: 136 mmol/L (ref 135–145)
Total Bilirubin: 0.6 mg/dL (ref 0.3–1.2)
Total Protein: 6.4 g/dL — ABNORMAL LOW (ref 6.5–8.1)

## 2016-03-17 LAB — CBC WITH DIFFERENTIAL/PLATELET
Basophils Absolute: 0.1 10*3/uL (ref 0–0.1)
Basophils Relative: 1 %
Eosinophils Absolute: 0.2 10*3/uL (ref 0–0.7)
Eosinophils Relative: 2 %
HCT: 35.8 % (ref 35.0–47.0)
Hemoglobin: 12.3 g/dL (ref 12.0–16.0)
Lymphocytes Relative: 18 %
Lymphs Abs: 1.4 10*3/uL (ref 1.0–3.6)
MCH: 31.8 pg (ref 26.0–34.0)
MCHC: 34.3 g/dL (ref 32.0–36.0)
MCV: 92.8 fL (ref 80.0–100.0)
Monocytes Absolute: 0.4 10*3/uL (ref 0.2–0.9)
Monocytes Relative: 5 %
Neutro Abs: 5.8 10*3/uL (ref 1.4–6.5)
Neutrophils Relative %: 74 %
Platelets: 203 10*3/uL (ref 150–440)
RBC: 3.86 MIL/uL (ref 3.80–5.20)
RDW: 13.9 % (ref 11.5–14.5)
WBC: 7.8 10*3/uL (ref 3.6–11.0)
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
## 2016-03-18 ENCOUNTER — Encounter: Payer: Self-pay | Admitting: Hematology and Oncology

## 2016-03-18 LAB — CANCER ANTIGEN 27.29: CA 27.29: 35.2 U/mL (ref 0.0–38.6)

## 2016-03-20 DIAGNOSIS — Z515 Encounter for palliative care: Secondary | ICD-10-CM | POA: Diagnosis not present

## 2016-03-20 DIAGNOSIS — I509 Heart failure, unspecified: Secondary | ICD-10-CM | POA: Diagnosis not present

## 2016-03-20 DIAGNOSIS — Z170 Estrogen receptor positive status [ER+]: Secondary | ICD-10-CM | POA: Diagnosis not present

## 2016-03-20 DIAGNOSIS — D0590 Unspecified type of carcinoma in situ of unspecified breast: Secondary | ICD-10-CM | POA: Diagnosis not present

## 2016-03-20 DIAGNOSIS — Z79811 Long term (current) use of aromatase inhibitors: Secondary | ICD-10-CM | POA: Diagnosis not present

## 2016-03-20 DIAGNOSIS — Z66 Do not resuscitate: Secondary | ICD-10-CM | POA: Diagnosis not present

## 2016-04-03 ENCOUNTER — Other Ambulatory Visit: Payer: Self-pay | Admitting: General Surgery

## 2016-04-03 ENCOUNTER — Ambulatory Visit
Admission: RE | Admit: 2016-04-03 | Discharge: 2016-04-03 | Disposition: A | Discharge status: Home / Self Care | Payer: Medicare Other | Source: Ambulatory Visit | Attending: General Surgery | Admitting: General Surgery

## 2016-04-03 DIAGNOSIS — C50912 Malignant neoplasm of unspecified site of left female breast: Secondary | ICD-10-CM | POA: Diagnosis not present

## 2016-04-04 ENCOUNTER — Encounter: Payer: Self-pay | Admitting: *Deleted

## 2016-04-06 ENCOUNTER — Other Ambulatory Visit: Payer: Medicare Other

## 2016-04-06 ENCOUNTER — Ambulatory Visit: Payer: Medicare Other

## 2016-04-11 ENCOUNTER — Encounter: Payer: Self-pay | Admitting: General Surgery

## 2016-04-11 ENCOUNTER — Ambulatory Visit (INDEPENDENT_AMBULATORY_CARE_PROVIDER_SITE_OTHER): Payer: Medicare Other | Admitting: General Surgery

## 2016-04-11 VITALS — BP 148/70 | HR 70 | Resp 16 | Ht 60.0 in | Wt 245.0 lb

## 2016-04-11 DIAGNOSIS — C50412 Malignant neoplasm of upper-outer quadrant of left female breast: Secondary | ICD-10-CM | POA: Diagnosis not present

## 2016-04-11 DIAGNOSIS — Z170 Estrogen receptor positive status [ER+]: Secondary | ICD-10-CM | POA: Diagnosis not present
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
## 2016-04-11 NOTE — Patient Instructions (Signed)
Patient will be asked to return to the office in one year with a bilateral diagnotic  mammogram.
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
## 2016-07-18 ENCOUNTER — Inpatient Hospital Stay (HOSPITAL_BASED_OUTPATIENT_CLINIC_OR_DEPARTMENT_OTHER): Payer: Medicare Other | Admitting: Hematology and Oncology

## 2016-07-18 ENCOUNTER — Inpatient Hospital Stay: Payer: Medicare Other | Attending: Hematology and Oncology

## 2016-07-18 VITALS — BP 123/73 | HR 64 | Temp 96.2°F

## 2016-07-18 DIAGNOSIS — Z9012 Acquired absence of left breast and nipple: Secondary | ICD-10-CM | POA: Diagnosis not present

## 2016-07-18 DIAGNOSIS — F419 Anxiety disorder, unspecified: Secondary | ICD-10-CM

## 2016-07-18 DIAGNOSIS — M129 Arthropathy, unspecified: Secondary | ICD-10-CM | POA: Diagnosis not present

## 2016-07-18 DIAGNOSIS — Z79899 Other long term (current) drug therapy: Secondary | ICD-10-CM | POA: Diagnosis not present

## 2016-07-18 DIAGNOSIS — Z9049 Acquired absence of other specified parts of digestive tract: Secondary | ICD-10-CM

## 2016-07-18 DIAGNOSIS — G4730 Sleep apnea, unspecified: Secondary | ICD-10-CM | POA: Diagnosis not present

## 2016-07-18 DIAGNOSIS — R948 Abnormal results of function studies of other organs and systems: Secondary | ICD-10-CM

## 2016-07-18 DIAGNOSIS — D509 Iron deficiency anemia, unspecified: Secondary | ICD-10-CM | POA: Insufficient documentation

## 2016-07-18 DIAGNOSIS — J449 Chronic obstructive pulmonary disease, unspecified: Secondary | ICD-10-CM

## 2016-07-18 DIAGNOSIS — Z85828 Personal history of other malignant neoplasm of skin: Secondary | ICD-10-CM | POA: Diagnosis not present

## 2016-07-18 DIAGNOSIS — Z8041 Family history of malignant neoplasm of ovary: Secondary | ICD-10-CM | POA: Diagnosis not present

## 2016-07-18 DIAGNOSIS — E041 Nontoxic single thyroid nodule: Secondary | ICD-10-CM | POA: Insufficient documentation

## 2016-07-18 DIAGNOSIS — Z79811 Long term (current) use of aromatase inhibitors: Secondary | ICD-10-CM | POA: Diagnosis not present

## 2016-07-18 DIAGNOSIS — Z89612 Acquired absence of left leg above knee: Secondary | ICD-10-CM | POA: Insufficient documentation

## 2016-07-18 DIAGNOSIS — C50912 Malignant neoplasm of unspecified site of left female breast: Secondary | ICD-10-CM | POA: Insufficient documentation

## 2016-07-18 DIAGNOSIS — Z170 Estrogen receptor positive status [ER+]: Secondary | ICD-10-CM

## 2016-07-18 DIAGNOSIS — Z8701 Personal history of pneumonia (recurrent): Secondary | ICD-10-CM | POA: Insufficient documentation

## 2016-07-18 DIAGNOSIS — I509 Heart failure, unspecified: Secondary | ICD-10-CM | POA: Insufficient documentation

## 2016-07-18 DIAGNOSIS — I2721 Secondary pulmonary arterial hypertension: Secondary | ICD-10-CM | POA: Diagnosis not present

## 2016-07-18 LAB — COMPREHENSIVE METABOLIC PANEL
ALT: 21 U/L (ref 14–54)
AST: 20 U/L (ref 15–41)
Albumin: 3.6 g/dL (ref 3.5–5.0)
Alkaline Phosphatase: 53 U/L (ref 38–126)
Anion gap: 7 (ref 5–15)
BUN: 20 mg/dL (ref 6–20)
CO2: 31 mmol/L (ref 22–32)
Calcium: 9.1 mg/dL (ref 8.9–10.3)
Chloride: 97 mmol/L — ABNORMAL LOW (ref 101–111)
Creatinine, Ser: 0.9 mg/dL (ref 0.44–1.00)
GFR calc Af Amer: >60 mL/min (ref >60)
GFR calc non Af Amer: >60 mL/min (ref >60)
Glucose, Bld: 181 mg/dL — ABNORMAL HIGH (ref 65–99)
Potassium: 3.5 mmol/L (ref 3.5–5.1)
Sodium: 135 mmol/L (ref 135–145)
Total Bilirubin: 0.6 mg/dL (ref 0.3–1.2)
Total Protein: 6.8 g/dL (ref 6.5–8.1)

## 2016-07-18 LAB — CBC WITH DIFFERENTIAL/PLATELET
Basophils Absolute: 0.1 10*3/uL (ref 0–0.1)
Basophils Relative: 1 %
Eosinophils Absolute: 0.1 10*3/uL (ref 0–0.7)
Eosinophils Relative: 2 %
HCT: 36.6 % (ref 35.0–47.0)
Hemoglobin: 12.6 g/dL (ref 12.0–16.0)
Lymphocytes Relative: 16 %
Lymphs Abs: 1.4 10*3/uL (ref 1.0–3.6)
MCH: 31.9 pg (ref 26.0–34.0)
MCHC: 34.4 g/dL (ref 32.0–36.0)
MCV: 92.8 fL (ref 80.0–100.0)
Monocytes Absolute: 0.7 10*3/uL (ref 0.2–0.9)
Monocytes Relative: 8 %
Neutro Abs: 6.2 10*3/uL (ref 1.4–6.5)
Neutrophils Relative %: 73 %
Platelets: 220 10*3/uL (ref 150–440)
RBC: 3.94 MIL/uL (ref 3.80–5.20)
RDW: 14.3 % (ref 11.5–14.5)
WBC: 8.5 10*3/uL (ref 3.6–11.0)

## 2016-07-18 LAB — FERRITIN: Ferritin: 67 ng/mL (ref 11–307)
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
## 2016-07-18 NOTE — Progress Notes (Signed)
Patient offers no concerns today. 

## 2016-07-19 LAB — CANCER ANTIGEN 27.29: CA 27.29: 28.8 U/mL (ref 0.0–38.6)

## 2016-08-08 ENCOUNTER — Encounter: Payer: Self-pay | Admitting: Hematology and Oncology

## 2016-11-16 ENCOUNTER — Other Ambulatory Visit: Payer: Medicare Other

## 2016-11-16 ENCOUNTER — Ambulatory Visit: Payer: Medicare Other | Admitting: Hematology and Oncology

## 2016-12-15 ENCOUNTER — Encounter: Payer: Self-pay | Admitting: Hematology and Oncology

## 2016-12-15 ENCOUNTER — Inpatient Hospital Stay: Payer: Medicare Other | Attending: Hematology and Oncology | Admitting: Hematology and Oncology

## 2016-12-15 ENCOUNTER — Inpatient Hospital Stay: Payer: Medicare Other

## 2016-12-15 VITALS — BP 138/78 | HR 65 | Temp 98.6°F | Resp 18

## 2016-12-15 DIAGNOSIS — Z8041 Family history of malignant neoplasm of ovary: Secondary | ICD-10-CM

## 2016-12-15 DIAGNOSIS — J449 Chronic obstructive pulmonary disease, unspecified: Secondary | ICD-10-CM

## 2016-12-15 DIAGNOSIS — Z79811 Long term (current) use of aromatase inhibitors: Secondary | ICD-10-CM | POA: Insufficient documentation

## 2016-12-15 DIAGNOSIS — C50912 Malignant neoplasm of unspecified site of left female breast: Secondary | ICD-10-CM

## 2016-12-15 DIAGNOSIS — Z8701 Personal history of pneumonia (recurrent): Secondary | ICD-10-CM

## 2016-12-15 DIAGNOSIS — I509 Heart failure, unspecified: Secondary | ICD-10-CM | POA: Diagnosis not present

## 2016-12-15 DIAGNOSIS — Z170 Estrogen receptor positive status [ER+]: Secondary | ICD-10-CM | POA: Diagnosis not present

## 2016-12-15 DIAGNOSIS — I2721 Secondary pulmonary arterial hypertension: Secondary | ICD-10-CM

## 2016-12-15 DIAGNOSIS — E042 Nontoxic multinodular goiter: Secondary | ICD-10-CM | POA: Diagnosis not present

## 2016-12-15 DIAGNOSIS — Z79899 Other long term (current) drug therapy: Secondary | ICD-10-CM | POA: Diagnosis not present

## 2016-12-15 DIAGNOSIS — D509 Iron deficiency anemia, unspecified: Secondary | ICD-10-CM

## 2016-12-15 DIAGNOSIS — R198 Other specified symptoms and signs involving the digestive system and abdomen: Secondary | ICD-10-CM

## 2016-12-15 DIAGNOSIS — Z85828 Personal history of other malignant neoplasm of skin: Secondary | ICD-10-CM | POA: Diagnosis not present

## 2016-12-15 DIAGNOSIS — F419 Anxiety disorder, unspecified: Secondary | ICD-10-CM

## 2016-12-15 DIAGNOSIS — G4730 Sleep apnea, unspecified: Secondary | ICD-10-CM | POA: Diagnosis not present

## 2016-12-15 LAB — CBC WITH DIFFERENTIAL/PLATELET
Basophils Absolute: 0.1 10*3/uL (ref 0–0.1)
Basophils Relative: 1 %
Eosinophils Absolute: 0.2 10*3/uL (ref 0–0.7)
Eosinophils Relative: 2 %
HCT: 36.2 % (ref 35.0–47.0)
Hemoglobin: 12.7 g/dL (ref 12.0–16.0)
Lymphocytes Relative: 21 %
Lymphs Abs: 1.9 10*3/uL (ref 1.0–3.6)
MCH: 32.3 pg (ref 26.0–34.0)
MCHC: 35 g/dL (ref 32.0–36.0)
MCV: 92.1 fL (ref 80.0–100.0)
Monocytes Absolute: 0.7 10*3/uL (ref 0.2–0.9)
Monocytes Relative: 7 %
Neutro Abs: 6.3 10*3/uL (ref 1.4–6.5)
Neutrophils Relative %: 69 %
Platelets: 215 10*3/uL (ref 150–440)
RBC: 3.93 MIL/uL (ref 3.80–5.20)
RDW: 13.7 % (ref 11.5–14.5)
WBC: 9 10*3/uL (ref 3.6–11.0)

## 2016-12-15 LAB — COMPREHENSIVE METABOLIC PANEL
ALT: 29 U/L (ref 14–54)
AST: 27 U/L (ref 15–41)
Albumin: 3.6 g/dL (ref 3.5–5.0)
Alkaline Phosphatase: 61 U/L (ref 38–126)
Anion gap: 8 (ref 5–15)
BUN: 16 mg/dL (ref 6–20)
CO2: 32 mmol/L (ref 22–32)
Calcium: 8.9 mg/dL (ref 8.9–10.3)
Chloride: 98 mmol/L — ABNORMAL LOW (ref 101–111)
Creatinine, Ser: 0.97 mg/dL (ref 0.44–1.00)
GFR calc Af Amer: >60 mL/min (ref >60)
GFR calc non Af Amer: 56 mL/min — ABNORMAL LOW (ref >60)
Glucose, Bld: 203 mg/dL — ABNORMAL HIGH (ref 65–99)
Potassium: 3.7 mmol/L (ref 3.5–5.1)
Sodium: 138 mmol/L (ref 135–145)
Total Bilirubin: 0.6 mg/dL (ref 0.3–1.2)
Total Protein: 6.5 g/dL (ref 6.5–8.1)

## 2016-12-15 LAB — FERRITIN: Ferritin: 73 ng/mL (ref 11–307)

## 2016-12-15 NOTE — Progress Notes (Signed)
Patient offers no complaints today. 
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
## 2016-12-16 LAB — CANCER ANTIGEN 27.29: CA 27.29: 29.3 U/mL (ref 0.0–38.6)

## 2016-12-18 ENCOUNTER — Telehealth: Payer: Self-pay | Admitting: *Deleted

## 2016-12-18 ENCOUNTER — Ambulatory Visit
Admission: RE | Admit: 2016-12-18 | Discharge: 2016-12-18 | Disposition: A | Discharge status: Home / Self Care | Payer: Medicare Other | Source: Ambulatory Visit | Attending: Hematology and Oncology | Admitting: Hematology and Oncology

## 2016-12-18 DIAGNOSIS — K8020 Calculus of gallbladder without cholecystitis without obstruction: Secondary | ICD-10-CM | POA: Diagnosis not present

## 2016-12-18 DIAGNOSIS — R1012 Left upper quadrant pain: Secondary | ICD-10-CM | POA: Insufficient documentation

## 2016-12-18 DIAGNOSIS — R198 Other specified symptoms and signs involving the digestive system and abdomen: Secondary | ICD-10-CM

## 2016-12-18 NOTE — Telephone Encounter (Signed)
Called patient's daughter [name] to inform her that patient's CA27.29 is normal.

## 2016-12-18 NOTE — Telephone Encounter (Signed)
-----   Message from Lequita Asal, MD sent at 12/16/2016  6:17 AM EDT ----- Regarding: Please call patient  CA27.29 is normal.  ----- Message ----- From: Interface, Lab In [facility] Sent: 12/15/2016   2:51 PM To: Lequita Asal, MD

## 2016-12-25 ENCOUNTER — Ambulatory Visit: Payer: Medicare Other | Admitting: Hematology and Oncology

## 2017-02-22 ENCOUNTER — Other Ambulatory Visit: Payer: Self-pay

## 2017-04-04 ENCOUNTER — Ambulatory Visit
Admission: RE | Admit: 2017-04-04 | Discharge: 2017-04-04 | Disposition: A | Discharge status: Home / Self Care | Payer: Medicare Other | Source: Ambulatory Visit | Attending: Hematology and Oncology | Admitting: Hematology and Oncology

## 2017-04-04 DIAGNOSIS — Z9889 Other specified postprocedural states: Secondary | ICD-10-CM | POA: Diagnosis not present

## 2017-04-04 DIAGNOSIS — Z170 Estrogen receptor positive status [ER+]: Secondary | ICD-10-CM | POA: Diagnosis not present

## 2017-04-04 DIAGNOSIS — C50912 Malignant neoplasm of unspecified site of left female breast: Secondary | ICD-10-CM | POA: Diagnosis not present

## 2017-04-10 ENCOUNTER — Ambulatory Visit (INDEPENDENT_AMBULATORY_CARE_PROVIDER_SITE_OTHER): Payer: Medicare Other | Admitting: General Surgery

## 2017-04-10 ENCOUNTER — Encounter: Payer: Self-pay | Admitting: General Surgery

## 2017-04-10 VITALS — HR 81 | Resp 16 | Ht 60.0 in | Wt 253.0 lb

## 2017-04-10 DIAGNOSIS — C50412 Malignant neoplasm of upper-outer quadrant of left female breast: Secondary | ICD-10-CM

## 2017-04-10 DIAGNOSIS — Z170 Estrogen receptor positive status [ER+]: Secondary | ICD-10-CM | POA: Diagnosis not present
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
## 2017-04-10 NOTE — Patient Instructions (Signed)
The patient has been asked to return to the office in one year with a bilateral diagnostic mammogram. 

## 2017-04-11 ENCOUNTER — Encounter: Payer: Self-pay | Admitting: General Surgery

## 2017-05-24 ENCOUNTER — Other Ambulatory Visit: Payer: Self-pay | Admitting: Family Medicine

## 2017-05-24 ENCOUNTER — Other Ambulatory Visit: Payer: Self-pay | Admitting: Specialist

## 2017-05-24 DIAGNOSIS — L98499 Non-pressure chronic ulcer of skin of other sites with unspecified severity: Secondary | ICD-10-CM

## 2017-06-06 ENCOUNTER — Ambulatory Visit
Admission: RE | Admit: 2017-06-06 | Discharge: 2017-06-06 | Disposition: A | Discharge status: Home / Self Care | Payer: Medicare Other | Source: Ambulatory Visit | Attending: Family Medicine | Admitting: Family Medicine

## 2017-06-06 DIAGNOSIS — K118 Other diseases of salivary glands: Secondary | ICD-10-CM | POA: Insufficient documentation

## 2017-06-06 DIAGNOSIS — R220 Localized swelling, mass and lump, head: Secondary | ICD-10-CM | POA: Diagnosis present

## 2017-06-06 DIAGNOSIS — C50912 Malignant neoplasm of unspecified site of left female breast: Secondary | ICD-10-CM | POA: Diagnosis not present

## 2017-06-06 DIAGNOSIS — L98499 Non-pressure chronic ulcer of skin of other sites with unspecified severity: Secondary | ICD-10-CM

## 2017-06-08 ENCOUNTER — Inpatient Hospital Stay: Payer: Medicare Other

## 2017-06-08 ENCOUNTER — Inpatient Hospital Stay: Payer: Medicare Other | Attending: Hematology and Oncology | Admitting: Hematology and Oncology

## 2017-06-08 VITALS — BP 115/80 | HR 74 | Temp 98.1°F | Resp 20

## 2017-06-08 DIAGNOSIS — F419 Anxiety disorder, unspecified: Secondary | ICD-10-CM | POA: Diagnosis not present

## 2017-06-08 DIAGNOSIS — D509 Iron deficiency anemia, unspecified: Secondary | ICD-10-CM | POA: Diagnosis not present

## 2017-06-08 DIAGNOSIS — G4730 Sleep apnea, unspecified: Secondary | ICD-10-CM | POA: Diagnosis not present

## 2017-06-08 DIAGNOSIS — Z923 Personal history of irradiation: Secondary | ICD-10-CM | POA: Insufficient documentation

## 2017-06-08 DIAGNOSIS — Z87891 Personal history of nicotine dependence: Secondary | ICD-10-CM | POA: Diagnosis not present

## 2017-06-08 DIAGNOSIS — E041 Nontoxic single thyroid nodule: Secondary | ICD-10-CM | POA: Diagnosis not present

## 2017-06-08 DIAGNOSIS — R220 Localized swelling, mass and lump, head: Secondary | ICD-10-CM | POA: Diagnosis not present

## 2017-06-08 DIAGNOSIS — Z170 Estrogen receptor positive status [ER+]: Secondary | ICD-10-CM | POA: Diagnosis not present

## 2017-06-08 DIAGNOSIS — C50912 Malignant neoplasm of unspecified site of left female breast: Secondary | ICD-10-CM

## 2017-06-08 DIAGNOSIS — Z85828 Personal history of other malignant neoplasm of skin: Secondary | ICD-10-CM | POA: Diagnosis not present

## 2017-06-08 DIAGNOSIS — I509 Heart failure, unspecified: Secondary | ICD-10-CM | POA: Diagnosis not present

## 2017-06-08 DIAGNOSIS — Z79811 Long term (current) use of aromatase inhibitors: Secondary | ICD-10-CM | POA: Diagnosis not present

## 2017-06-08 DIAGNOSIS — M129 Arthropathy, unspecified: Secondary | ICD-10-CM | POA: Diagnosis not present

## 2017-06-08 DIAGNOSIS — Z8041 Family history of malignant neoplasm of ovary: Secondary | ICD-10-CM | POA: Insufficient documentation

## 2017-06-08 DIAGNOSIS — Z79899 Other long term (current) drug therapy: Secondary | ICD-10-CM | POA: Insufficient documentation

## 2017-06-08 DIAGNOSIS — R930 Abnormal findings on diagnostic imaging of skull and head, not elsewhere classified: Secondary | ICD-10-CM

## 2017-06-08 DIAGNOSIS — J449 Chronic obstructive pulmonary disease, unspecified: Secondary | ICD-10-CM | POA: Diagnosis not present

## 2017-06-08 DIAGNOSIS — K8020 Calculus of gallbladder without cholecystitis without obstruction: Secondary | ICD-10-CM | POA: Diagnosis not present

## 2017-06-08 DIAGNOSIS — I2721 Secondary pulmonary arterial hypertension: Secondary | ICD-10-CM | POA: Diagnosis not present

## 2017-06-08 DIAGNOSIS — C50412 Malignant neoplasm of upper-outer quadrant of left female breast: Secondary | ICD-10-CM

## 2017-06-08 LAB — CBC WITH DIFFERENTIAL/PLATELET
BASOS ABS: 0.1 10*3/uL (ref 0–0.1)
Basophils Relative: 1 %
EOS ABS: 0.1 10*3/uL (ref 0–0.7)
Eosinophils Relative: 1 %
HCT: 40.3 % (ref 35.0–47.0)
HEMOGLOBIN: 13.5 g/dL (ref 12.0–16.0)
LYMPHS ABS: 1.9 10*3/uL (ref 1.0–3.6)
Lymphocytes Relative: 16 %
MCH: 31.7 pg (ref 26.0–34.0)
MCHC: 33.4 g/dL (ref 32.0–36.0)
MCV: 94.9 fL (ref 80.0–100.0)
Monocytes Absolute: 0.8 10*3/uL (ref 0.2–0.9)
Monocytes Relative: 7 %
NEUTROS PCT: 75 %
Neutro Abs: 8.8 10*3/uL — ABNORMAL HIGH (ref 1.4–6.5)
Platelets: 207 10*3/uL (ref 150–440)
RBC: 4.25 MIL/uL (ref 3.80–5.20)
RDW: 14.1 % (ref 11.5–14.5)
WBC: 11.7 10*3/uL — AB (ref 3.6–11.0)

## 2017-06-08 LAB — COMPREHENSIVE METABOLIC PANEL
ALT: 19 U/L (ref 14–54)
AST: 20 U/L (ref 15–41)
Albumin: 3.6 g/dL (ref 3.5–5.0)
Alkaline Phosphatase: 65 U/L (ref 38–126)
Anion gap: 12 (ref 5–15)
BILIRUBIN TOTAL: 0.8 mg/dL (ref 0.3–1.2)
BUN: 19 mg/dL (ref 6–20)
CHLORIDE: 92 mmol/L — AB (ref 101–111)
CO2: 33 mmol/L — ABNORMAL HIGH (ref 22–32)
CREATININE: 1.09 mg/dL — AB (ref 0.44–1.00)
Calcium: 8.9 mg/dL (ref 8.9–10.3)
GFR, EST AFRICAN AMERICAN: 56 mL/min — AB (ref >60)
GFR, EST NON AFRICAN AMERICAN: 48 mL/min — AB (ref >60)
Glucose, Bld: 264 mg/dL — ABNORMAL HIGH (ref 65–99)
Potassium: 3.2 mmol/L — ABNORMAL LOW (ref 3.5–5.1)
Sodium: 137 mmol/L (ref 135–145)
TOTAL PROTEIN: 6.5 g/dL (ref 6.5–8.1)

## 2017-06-08 LAB — LACTATE DEHYDROGENASE: LDH: 124 U/L (ref 98–192)

## 2017-06-08 NOTE — Progress Notes (Signed)
Patient states she has just gotten over bronchitis.  She is here today because of a knot on her head that is believed to be cancer.  Patient presents today with her daughter.  Resident of [facility].
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
## 2017-06-09 LAB — CEA: CEA1: 4.5 ng/mL (ref 0.0–4.7)

## 2017-06-09 LAB — CANCER ANTIGEN 27.29: CAN 27.29: 39.4 U/mL — AB (ref 0.0–38.6)

## 2017-06-11 ENCOUNTER — Ambulatory Visit
Admission: RE | Admit: 2017-06-11 | Discharge: 2017-06-11 | Disposition: A | Discharge status: Home / Self Care | Payer: Medicare Other | Source: Ambulatory Visit | Attending: Urgent Care | Admitting: Urgent Care

## 2017-06-11 DIAGNOSIS — C50912 Malignant neoplasm of unspecified site of left female breast: Secondary | ICD-10-CM | POA: Diagnosis not present

## 2017-06-11 DIAGNOSIS — R930 Abnormal findings on diagnostic imaging of skull and head, not elsewhere classified: Secondary | ICD-10-CM | POA: Diagnosis present

## 2017-06-11 DIAGNOSIS — R221 Localized swelling, mass and lump, neck: Secondary | ICD-10-CM | POA: Insufficient documentation

## 2017-06-11 DIAGNOSIS — Z170 Estrogen receptor positive status [ER+]: Secondary | ICD-10-CM | POA: Insufficient documentation

## 2017-06-11 DIAGNOSIS — C7949 Secondary malignant neoplasm of other parts of nervous system: Secondary | ICD-10-CM | POA: Diagnosis not present

## 2017-06-11 LAB — PROTEIN ELECTROPHORESIS, SERUM
A/G RATIO SPE: 1.1 (ref 0.7–1.7)
Albumin ELP: 3.2 g/dL (ref 2.9–4.4)
Alpha-1-Globulin: 0.2 g/dL (ref 0.0–0.4)
Alpha-2-Globulin: 0.9 g/dL (ref 0.4–1.0)
BETA GLOBULIN: 1 g/dL (ref 0.7–1.3)
GAMMA GLOBULIN: 0.9 g/dL (ref 0.4–1.8)
Globulin, Total: 3 g/dL (ref 2.2–3.9)
Total Protein ELP: 6.2 g/dL (ref 6.0–8.5)

## 2017-06-11 LAB — KAPPA/LAMBDA LIGHT CHAINS
KAPPA, LAMDA LIGHT CHAIN RATIO: 0.75 (ref 0.26–1.65)
Kappa free light chain: 19.2 mg/L (ref 3.3–19.4)
Lambda free light chains: 25.5 mg/L (ref 5.7–26.3)

## 2017-06-11 MED ORDER — GADOBENATE DIMEGLUMINE 529 MG/ML IV SOLN
20.0000 mL | Freq: Once | INTRAVENOUS | Status: AC | PRN
  Administered 2017-06-11: 20 mL via INTRAVENOUS

## 2017-06-12 ENCOUNTER — Encounter: Payer: Self-pay | Admitting: Hematology and Oncology

## 2017-06-12 ENCOUNTER — Other Ambulatory Visit: Payer: Self-pay | Admitting: Hematology and Oncology

## 2017-06-12 ENCOUNTER — Telehealth: Payer: Self-pay | Admitting: Urgent Care

## 2017-06-12 ENCOUNTER — Other Ambulatory Visit: Payer: Self-pay | Admitting: Urgent Care

## 2017-06-12 ENCOUNTER — Telehealth: Payer: Self-pay | Admitting: *Deleted

## 2017-06-12 DIAGNOSIS — C50912 Malignant neoplasm of unspecified site of left female breast: Secondary | ICD-10-CM

## 2017-06-12 DIAGNOSIS — R930 Abnormal findings on diagnostic imaging of skull and head, not elsewhere classified: Secondary | ICD-10-CM

## 2017-06-12 DIAGNOSIS — Z170 Estrogen receptor positive status [ER+]: Principal | ICD-10-CM

## 2017-06-12 NOTE — Telephone Encounter (Signed)
Called patients daughter, [name] to inform her that MD recommends proceeding with PET scan.  That has been scheduled for Thursday, January 8 @ 10:30 am.  Patient will need to arrive @ 10:00 am.  Once the PET is completed we can get her an appointment to come back to discuss results.

## 2017-06-12 NOTE — Telephone Encounter (Signed)
Returned call to facility NP to discuss results from most recent MRI. NP aware that patient has discrete mass (4x2 cm) in the left temporalis muscle, in addition to dural thickness, both of much are thought to be tumoral. Patient is scheduled for a PET scan tomorrow. NP wishing to start patient on Metformin due to a HgbA1c > 9. She was asking for clearance. Given that the patient is undergoing CT and PET  Imaging, with a planned biopsy, I asked that she hold off until next week to start the Metformin. After receiving contrast media, Metformin is generally held for 48 hours anyway. Plans are to start the Metformin next week. Reviewed recent labs done here. Pointed out the slight degree of HYPOkalemia (3.2) and that patient was prescribed 3 days of Potassium replacement; acknowledged. She confirmed that labs would be drawn next week as per my orders. Advised NP to return call with any further questions, concerns, or discussions pertaining to the care of Melissa Mcdonald.

## 2017-06-14 ENCOUNTER — Encounter: Payer: Self-pay | Admitting: Urgent Care

## 2017-06-14 ENCOUNTER — Telehealth: Payer: Self-pay | Admitting: Urgent Care

## 2017-06-14 ENCOUNTER — Ambulatory Visit
Admission: RE | Admit: 2017-06-14 | Discharge: 2017-06-14 | Disposition: A | Discharge status: Home / Self Care | Payer: Medicare Other | Source: Ambulatory Visit | Attending: Urgent Care | Admitting: Urgent Care

## 2017-06-14 DIAGNOSIS — Z01812 Encounter for preprocedural laboratory examination: Secondary | ICD-10-CM | POA: Diagnosis present

## 2017-06-14 DIAGNOSIS — R930 Abnormal findings on diagnostic imaging of skull and head, not elsewhere classified: Secondary | ICD-10-CM | POA: Insufficient documentation

## 2017-06-14 DIAGNOSIS — Z170 Estrogen receptor positive status [ER+]: Secondary | ICD-10-CM | POA: Diagnosis present

## 2017-06-14 DIAGNOSIS — C50912 Malignant neoplasm of unspecified site of left female breast: Secondary | ICD-10-CM | POA: Diagnosis not present

## 2017-06-14 LAB — GLUCOSE, CAPILLARY
Glucose-Capillary: 273 mg/dL — ABNORMAL HIGH (ref 65–99)
Glucose-Capillary: 262 mg/dL — ABNORMAL HIGH (ref 65–99)

## 2017-06-14 NOTE — Telephone Encounter (Signed)
Call placed to Neldon Labella, NP with [employer] who covers patient at [facility] to discuss plan of care. NP made aware of the fact that PET scan had to be cancelled today due to issues with her blood sugar. NP aware, as patient has already spoke to her. Plan is to start patient on basal insulin (Lantus) insulin coverage starting tonight. She has elected to hold off on Metformin at this time. We will give the patient a few days for the medication to take effect. Plans are to repeat the scan on 06/18/2017 at 0830. I will call and speak with the patient's daughter to make her aware.

## 2017-06-14 NOTE — Telephone Encounter (Signed)
Call placed to patient's daughter [name]) to discuss plan of care following tumor board today. I discussed with her that ENT feels as if patient is not a surgical candidate at this point. The consensus from the group was to proceed with the PET scan ans discussed, followed by a core biopsy.   I advised [name] that I had spoken with Neldon Labella, NP and advised her of what was going on. Patient had already spoken with her prior to my call and they had decided to start basal insulin therapy. I will plan on checking in on patient over the weekend to assess how the Lantus is doing with regards to controlling her blood sugars. If CBGs are controlled, we will plan on rescheduling the PET scan for Monday morning at 0830. Daughter verbalizes understanding and consent to proceed with plan of care as dicussed with this NP.   Daughter very appreciative of communication from her oncology team. She notes she she and her mother both are feeling better knowing that they have someone in their corner looking out for their best interest. I encouraged [name] to return a call to the clinic with any questions or concerns.   Spoke with [facility] staff to make them aware of patient's appointment for the PET scan is now Monday 06/18/2017 at 0830. They are aware of the new insuline orders placed by [employer].  She needs to be NPO after midnight.  I also stressed the fact that patient would need to have a CBG < 200 on Monday morning.

## 2017-06-14 NOTE — Progress Notes (Signed)
Met with patient's daughter today to discuss plan of care. Patient had a recent MRI that was concerning for metastatic disease. Patient was subsequently scheduled for a PET scan to further assess. Patient arrived today for the ordered PET scan. Despite FSBS checks x 2, the PET scan could not be performed due to the fact that the patient was HYPERglycemic. Of note, patient was diagnosed with T2DM earlier this week. PCP anticipates starting patient on oral antidiabetic (Metformin) on Monday of next week. Delay was to allow for patient to get through all of the contrasted imaging required to diagnose and stage her disease.   Daughter notes that she has a fear of the unknown. I reviewed the MRI with her and discussed the plans going forward, which unfortunately includes the PET scan that was unable to be completed today. The PET scan requires an injection of a radioactive sugar, which will in turn, cause further elevation in patient's blood sugar levels. I reviewed with the daughter that we would work with her PCP in order to get things taken care of in the most expeditious manner possible. Daughter mentioned several times that she was concerned about the prognosis. I advised her that we are unsure of the severity and staging of the disease at this point. I encouraged her allow [place] to work with her and her mother in order to determine the best course of treatment. We discussed the next steps:  1. Discuss elevated CBG with PCP. Patient may need SSI coverage in order to be able to get the scan done. She cannot have insulin 4 hours prior to the scan. I will communicate with Neldon Labella, NP at patient's SNF and advise her of the needs for glycemic control prior to the PET scan. I will ask that NP proceed with starting the patient on the Metformin that we previously discussed. Additionally, I will see if we can perhaps have a low dose SSI scale put into place.  2. Once CBGs normalize, we can reschedule the PET  scan. I have personally spoken with Judson Roch in radiology. I was advised that they would get this patient worked back in within 1-2 business days after her CBGs are under control.   3.  Patient to be discussed at tumor board later today. Advised daughter that patient would be presented to a multidisciplinary group in order to determine the best ways to approach staging and managing her mother's disease. I will plan on following up with daughter later today to discuss the consensus from the tumor board meeting.   Patient's daughter very appreciate of the time taken to meet with her today. She expresses that she feels better knowing that things are being worked on. She was advised that there are several things going on in the background, and that her oncology team was diligently working to make things happen for her mother. Update provided to Dr. Mike Gip regarding the plan of care for this patient.   Honor Loh, MSN, APRN, FNP-C, CEN Oncology/Hematology Nurse Practitioner  [facility] 06/14/17, 12:53 PM

## 2017-06-18 ENCOUNTER — Encounter
Admission: RE | Admit: 2017-06-18 | Discharge: 2017-06-18 | Disposition: A | Discharge status: Home / Self Care | Payer: Medicare Other | Source: Ambulatory Visit | Attending: Urgent Care | Admitting: Urgent Care

## 2017-06-18 ENCOUNTER — Ambulatory Visit: Payer: Medicare Other | Admitting: Hematology and Oncology

## 2017-06-18 DIAGNOSIS — R930 Abnormal findings on diagnostic imaging of skull and head, not elsewhere classified: Secondary | ICD-10-CM | POA: Diagnosis present

## 2017-06-18 DIAGNOSIS — K118 Other diseases of salivary glands: Secondary | ICD-10-CM | POA: Diagnosis not present

## 2017-06-18 DIAGNOSIS — Z170 Estrogen receptor positive status [ER+]: Secondary | ICD-10-CM | POA: Diagnosis present

## 2017-06-18 DIAGNOSIS — C50912 Malignant neoplasm of unspecified site of left female breast: Secondary | ICD-10-CM | POA: Insufficient documentation

## 2017-06-18 LAB — GLUCOSE, CAPILLARY: GLUCOSE-CAPILLARY: 231 mg/dL — AB (ref 65–99)

## 2017-06-20 ENCOUNTER — Telehealth: Payer: Self-pay | Admitting: Urgent Care

## 2017-06-20 ENCOUNTER — Telehealth: Payer: Self-pay | Admitting: *Deleted

## 2017-06-20 NOTE — Telephone Encounter (Addendum)
I called daughter, she upset that patient is not getting what she needs from either [place] or the SNF. She report that none of the staff has an order in regards to the CBG needing to be <200 and that she had to be the one to tell her mother that she has cancer. She states she is the one who gave her the banana bread because she had not eaten and she only ate a little bit of it. She told staff not to put her on the Lucianne Lei if her Glu was more than 200, but they did so anyway. Can you please call MS Ebony Hail, NP and the daughter to clear up any miscommunication. She is concerned of what will happen if her mothers sugars are not able to get below the 200 mark.

## 2017-06-20 NOTE — Telephone Encounter (Signed)
The SNF is supposed to call us here at the clinic when her CBGs have been controlled for 48 hours. I called over the weekend and was told that her sugars were "much better" and "fine"; no specific reading were communicated. The SNF staff is aware that the patient's CBG has to be < 200. They started her on basal insulin coverage. I am assuming that some SSI coverage was also added, as they "gave her a shot" 40 minutes prior to her coming over for the scan per report from radiology. Of note, the facility was made aware that the patient cannot have insulin 4 hours prior to the scan. It is up to the facility and the covering provider Neldon Labella, NP) to get her under better glycemic control prior to me even attempting to schedule the exam again.

## 2017-06-20 NOTE — Telephone Encounter (Signed)
Reports that patient unable to get her PET scan due to high GLU and is asking what is to be done next. Please call her back [phone]

## 2017-06-20 NOTE — Telephone Encounter (Signed)
Call placed to patient's daughter to review with her the conversation that I had with Barnetta Chapel (ADON). I advised daughter that the plan is to touch base with the facility on Friday. If sugars are good, I will proceed with rescheduling the PET scan. Additionally, I will plan on sending over a written order for the CBG to be checked just before patient is transported. If CBG is not < 200, the patient will need to be rescheduled. Daughter very appreciative of communication for her oncology care team.

## 2017-06-20 NOTE — Telephone Encounter (Signed)
Returned a call to patient's daughter [name]) to discuss plan of care. Daughter upset because PET scan could not be done for the second time on Monday. She notes that she has expressed her concerns to SNF staff. Daughter reports that basal insulin has been changed to "fast acting" insulin. Despite SSI, CBGs have been running 260 -380. Dr. Tamala Julian is involved at the facility. Daughter notes that her mother has a multitude of questions. She (daughter) is upset because she had to tell her that she had cancer. Daughter reminded that we discussed the probability of malignancy during her office visit. I reviewed the plan. If the MRI was positive, we were going to proceed with the PET scan and biopsy. Daughter notes that the facility advised her that there were missing orders in the patient's chart pertaining to glycemic control needs. I advised her that all of this information had been discussed with Neldon Labella, NP. I ensured the patient's daughter that we would get her CBGs controlled and get her through the ordered PET imaging.   Will attempt to call facility and discuss concerns with DON and/or unit manager. After speaking with facility, I will determine what information and/or orders are needed in order to get this patient taken care of. I will plan on sending a detailed plan of care to the facility in order to ensure that care measures are met so that we can proceed with PET imaging and subsequent biopsy.   I provided a great deal of reassurance to the patient's daughter. She and her mother are having a hard time with the fear of the unknown. I advised her that I would follow up with her on the plans going forward. Daughter was very appreciative of the follow up communication from the cancer center. I asked her to allow me time to get to the bottom of her concerns. Despite her overall level of frustration, daughter verbalized understanding and agreement.

## 2017-06-20 NOTE — Telephone Encounter (Signed)
Call placed to facility. I spoke with Kathyrn Sheriff (ADON). I reviewed the daughter's concerns with her. She has previously met with the daughter this week and was aware of everything that I discussed with her. We discussed patient's current medications. Patient is on 10 units of Novolog TID with meals and 30 Lantus 30 units qhs. Patient's CBGs running "still over the 200 that you all want". ADON confirmed that she had instructions from the PET department. Patient was placed on the Novolog on Monday of this week. I will plan on touching base with SNF on Friday of this week to determine whether or not CBGs are under control. If patient has achieved better glycemic control, I will proceed with rescheduling her PET scan. My main contact person at [facility] with be Kathyrn Sheriff (ADON).

## 2017-06-20 NOTE — Telephone Encounter (Signed)
Call placed to the facility to discuss care concerns and plan of care. I asked to speak with the DON, however she was not available. I asked to speak with the unit manager, however she was not available either. LMOM asking for a return call to the cancer center to discuss patient.

## 2017-06-22 ENCOUNTER — Telehealth: Payer: Self-pay | Admitting: Urgent Care

## 2017-06-22 ENCOUNTER — Telehealth: Payer: Self-pay | Admitting: *Deleted

## 2017-06-22 NOTE — Telephone Encounter (Signed)
Communication sent to SNF with instructions for patient's reschedule PET scan. Instructions sent are as follows:  1.  PET scan rescheduled for 06/29/2017. Patient needs to be here by 7:30am.  2.  Please ensure that patient follows previous prep instructions. She has to be NPO after midnight.  3.  Please DO NOT give patient ANY FORM of insulin after 3:00am on 06/29/2017. She cannot have the procedure if she has had insulin within 4 hours of her PET scan.  4.  PLEASE check and document pre-prandial FSBS on patient prior to her being transported to [facility] for her PET scan. As previously communicated, it is imperative that her pre-prandial FSBS be < 200 or radiology WILL NOT do her scan.   Fax confirmation received and placed in medical records box to be scanned into EHR.

## 2017-06-22 NOTE — Telephone Encounter (Signed)
Called patients daughter Mrs.Truddie Crumble and made her aware of her mother's PET scan that was Rescheduled for 06/29/17 @ 7:30am She is aware of Location, date and time. Also a reminder letter will be sent out.

## 2017-06-22 NOTE — Telephone Encounter (Signed)
Call placed to [facility] to discuss patient's blood sugars. Per [employer] nurse, patient's blood sugars have been running 170-202 for the last 48 hours. I will try to reschedule her PET. Once date and time received, I will send facility additional orders to clarify procedure and requirements prior to scan.

## 2017-06-29 ENCOUNTER — Other Ambulatory Visit: Payer: Self-pay | Admitting: Urgent Care

## 2017-06-29 ENCOUNTER — Encounter
Admission: RE | Admit: 2017-06-29 | Discharge: 2017-06-29 | Disposition: A | Discharge status: Home / Self Care | Payer: Medicare Other | Source: Ambulatory Visit | Attending: Urgent Care | Admitting: Urgent Care

## 2017-06-29 DIAGNOSIS — C50912 Malignant neoplasm of unspecified site of left female breast: Secondary | ICD-10-CM | POA: Insufficient documentation

## 2017-06-29 DIAGNOSIS — Z170 Estrogen receptor positive status [ER+]: Secondary | ICD-10-CM | POA: Insufficient documentation

## 2017-06-29 DIAGNOSIS — R590 Localized enlarged lymph nodes: Secondary | ICD-10-CM

## 2017-06-29 DIAGNOSIS — R930 Abnormal findings on diagnostic imaging of skull and head, not elsewhere classified: Secondary | ICD-10-CM | POA: Diagnosis present

## 2017-06-29 DIAGNOSIS — C50012 Malignant neoplasm of nipple and areola, left female breast: Secondary | ICD-10-CM

## 2017-06-29 LAB — GLUCOSE, CAPILLARY: GLUCOSE-CAPILLARY: 93 mg/dL (ref 65–99)

## 2017-06-29 MED ORDER — FLUDEOXYGLUCOSE F - 18 (FDG) INJECTION
12.4400 | Freq: Once | INTRAVENOUS | Status: AC | PRN
  Administered 2017-06-29: 12.44 via INTRAVENOUS

## 2017-07-02 ENCOUNTER — Telehealth: Payer: Self-pay | Admitting: *Deleted

## 2017-07-02 NOTE — Telephone Encounter (Signed)
Called and Spoke with patients daughter. Made her aware of location, date and time of her  Mother's US Guided Biopsy. Also a letter was mailed out

## 2017-07-02 NOTE — Telephone Encounter (Addendum)
Melissa Mcdonald called asking for results of PET, she has no follow up appointment at this time and is scheduled for [place] Bx 07/09/17. Please return her call  [phone]  IMPRESSION: 1. 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 2. Increasing number of multiple hypermetabolic lymph nodes in the left neck, consistent with metastatic disease. 3. Known left temporal scalp and calvarial lesion is hypermetabolic, also consistent with metastatic disease. 4. If not recently performed, biopsy of the temporal scalp lesion or cervical adenopathy should be considered to differentiate metastatic breast from thyroid cancer. 5. No other suspicious findings in the chest, abdomen or pelvis. Hepatic steatosis and cholelithiasis noted.   Electronically Signed   By: Richardean Sale M.D.   On: 06/29/2017 10:38

## 2017-07-02 NOTE — Telephone Encounter (Signed)
Call returned to patient's daughter to discuss results from patient's recent PET scan. Discussed the findings of multiple hypermetabolic lymph nodes in the LEFT neck. Unsure if this represents a metastatic breast or thyroid cancer, or if we are dealing with a whole new primary all together. Multiple questions fielded by this NP. Daughter encouraged to return a call to the cancer center should she come up with any questions in the coming days.   Going forward, daughter was advised that patient is scheduled for a cervical lymph node biopsy on 07/09/2017 at 11:00 am. Patient will need to arrive at the medical mall by 10am. I will send this information over to SNF to ensure that they are aware of the appointment and time.

## 2017-07-05 ENCOUNTER — Other Ambulatory Visit: Payer: Self-pay | Admitting: Radiology

## 2017-07-09 ENCOUNTER — Ambulatory Visit
Admission: RE | Admit: 2017-07-09 | Discharge: 2017-07-09 | Disposition: A | Discharge status: Home / Self Care | Payer: Medicare Other | Source: Ambulatory Visit | Attending: Urgent Care | Admitting: Urgent Care

## 2017-07-09 DIAGNOSIS — F419 Anxiety disorder, unspecified: Secondary | ICD-10-CM | POA: Insufficient documentation

## 2017-07-09 DIAGNOSIS — R590 Localized enlarged lymph nodes: Secondary | ICD-10-CM

## 2017-07-09 DIAGNOSIS — J449 Chronic obstructive pulmonary disease, unspecified: Secondary | ICD-10-CM | POA: Diagnosis not present

## 2017-07-09 DIAGNOSIS — Z9841 Cataract extraction status, right eye: Secondary | ICD-10-CM | POA: Diagnosis not present

## 2017-07-09 DIAGNOSIS — Z853 Personal history of malignant neoplasm of breast: Secondary | ICD-10-CM | POA: Insufficient documentation

## 2017-07-09 DIAGNOSIS — Z886 Allergy status to analgesic agent status: Secondary | ICD-10-CM | POA: Insufficient documentation

## 2017-07-09 DIAGNOSIS — Z823 Family history of stroke: Secondary | ICD-10-CM | POA: Diagnosis not present

## 2017-07-09 DIAGNOSIS — Z8249 Family history of ischemic heart disease and other diseases of the circulatory system: Secondary | ICD-10-CM | POA: Diagnosis not present

## 2017-07-09 DIAGNOSIS — C8511 Unspecified B-cell lymphoma, lymph nodes of head, face, and neck: Secondary | ICD-10-CM | POA: Diagnosis not present

## 2017-07-09 DIAGNOSIS — Z836 Family history of other diseases of the respiratory system: Secondary | ICD-10-CM | POA: Diagnosis not present

## 2017-07-09 DIAGNOSIS — I509 Heart failure, unspecified: Secondary | ICD-10-CM | POA: Diagnosis not present

## 2017-07-09 DIAGNOSIS — Z7951 Long term (current) use of inhaled steroids: Secondary | ICD-10-CM | POA: Diagnosis not present

## 2017-07-09 DIAGNOSIS — D34 Benign neoplasm of thyroid gland: Secondary | ICD-10-CM | POA: Insufficient documentation

## 2017-07-09 DIAGNOSIS — Z9842 Cataract extraction status, left eye: Secondary | ICD-10-CM | POA: Diagnosis not present

## 2017-07-09 DIAGNOSIS — I2721 Secondary pulmonary arterial hypertension: Secondary | ICD-10-CM | POA: Diagnosis not present

## 2017-07-09 DIAGNOSIS — Z79899 Other long term (current) drug therapy: Secondary | ICD-10-CM | POA: Insufficient documentation

## 2017-07-09 DIAGNOSIS — G4730 Sleep apnea, unspecified: Secondary | ICD-10-CM | POA: Insufficient documentation

## 2017-07-09 DIAGNOSIS — Z888 Allergy status to other drugs, medicaments and biological substances status: Secondary | ICD-10-CM | POA: Diagnosis not present

## 2017-07-09 DIAGNOSIS — Z8041 Family history of malignant neoplasm of ovary: Secondary | ICD-10-CM | POA: Diagnosis not present

## 2017-07-09 DIAGNOSIS — Z85828 Personal history of other malignant neoplasm of skin: Secondary | ICD-10-CM | POA: Diagnosis not present

## 2017-07-09 DIAGNOSIS — Z170 Estrogen receptor positive status [ER+]: Secondary | ICD-10-CM

## 2017-07-09 DIAGNOSIS — C50012 Malignant neoplasm of nipple and areola, left female breast: Secondary | ICD-10-CM

## 2017-07-09 LAB — CBC
HCT: 37.8 % (ref 35.0–47.0)
Hemoglobin: 13.2 g/dL (ref 12.0–16.0)
MCH: 32.6 pg (ref 26.0–34.0)
MCHC: 34.8 g/dL (ref 32.0–36.0)
MCV: 93.7 fL (ref 80.0–100.0)
PLATELETS: 213 10*3/uL (ref 150–440)
RBC: 4.04 MIL/uL (ref 3.80–5.20)
RDW: 13.9 % (ref 11.5–14.5)
WBC: 7.8 10*3/uL (ref 3.6–11.0)

## 2017-07-09 LAB — APTT: aPTT: 31 seconds (ref 24–36)

## 2017-07-09 LAB — GLUCOSE, CAPILLARY: Glucose-Capillary: 126 mg/dL — ABNORMAL HIGH (ref 65–99)

## 2017-07-09 LAB — PROTIME-INR
INR: 0.98
PROTHROMBIN TIME: 12.9 s (ref 11.4–15.2)

## 2017-07-09 MED ORDER — MIDAZOLAM HCL 2 MG/2ML IJ SOLN
INTRAMUSCULAR | Status: AC | PRN
  Administered 2017-07-09: 1 mg via INTRAVENOUS

## 2017-07-09 MED ORDER — FENTANYL CITRATE (PF) 100 MCG/2ML IJ SOLN
INTRAMUSCULAR | Status: AC
  Filled 2017-07-09: qty 4

## 2017-07-09 MED ORDER — MIDAZOLAM HCL 5 MG/5ML IJ SOLN
INTRAMUSCULAR | Status: AC
  Filled 2017-07-09: qty 5

## 2017-07-09 MED ORDER — FENTANYL CITRATE (PF) 100 MCG/2ML IJ SOLN
INTRAMUSCULAR | Status: AC | PRN
  Administered 2017-07-09: 25 ug via INTRAVENOUS

## 2017-07-09 MED ORDER — SODIUM CHLORIDE 0.9 % IV SOLN
INTRAVENOUS | Status: DC
  Administered 2017-07-09: 10:00:00 via INTRAVENOUS
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
## 2017-07-09 NOTE — Procedures (Signed)
Interventional Radiology Procedure Note  Procedure: US guided core biopsy of left cervical lymph nodes  Complications: None  Estimated Blood Loss: < 10 mL  Findings: Two adjacent lymph nodes in left neck sampled with 18 G core needle device. Cores submitted in formalin (2) and on saline soaked Telfa (2).  Venetia Night. Kathlene Cote, M.D Pager:  [phone]

## 2017-07-11 ENCOUNTER — Telehealth: Payer: Self-pay | Admitting: *Deleted

## 2017-07-11 NOTE — Telephone Encounter (Signed)
Called patient and made her aware of her scheduled  Date and Time of her MD  appt. on 07/17/17

## 2017-07-17 ENCOUNTER — Encounter: Payer: Self-pay | Admitting: Hematology and Oncology

## 2017-07-17 ENCOUNTER — Encounter: Payer: Self-pay | Admitting: Urgent Care

## 2017-07-17 ENCOUNTER — Inpatient Hospital Stay: Payer: Medicare Other

## 2017-07-17 ENCOUNTER — Inpatient Hospital Stay: Payer: Medicare Other | Attending: Hematology and Oncology | Admitting: Hematology and Oncology

## 2017-07-17 VITALS — BP 142/81 | HR 65 | Temp 97.8°F | Resp 18 | Wt 248.0 lb

## 2017-07-17 DIAGNOSIS — E119 Type 2 diabetes mellitus without complications: Secondary | ICD-10-CM

## 2017-07-17 DIAGNOSIS — C50412 Malignant neoplasm of upper-outer quadrant of left female breast: Secondary | ICD-10-CM | POA: Insufficient documentation

## 2017-07-17 DIAGNOSIS — E790 Hyperuricemia without signs of inflammatory arthritis and tophaceous disease: Secondary | ICD-10-CM | POA: Insufficient documentation

## 2017-07-17 DIAGNOSIS — C8511 Unspecified B-cell lymphoma, lymph nodes of head, face, and neck: Secondary | ICD-10-CM | POA: Insufficient documentation

## 2017-07-17 DIAGNOSIS — R930 Abnormal findings on diagnostic imaging of skull and head, not elsewhere classified: Secondary | ICD-10-CM

## 2017-07-17 DIAGNOSIS — C8301 Small cell B-cell lymphoma, lymph nodes of head, face, and neck: Secondary | ICD-10-CM

## 2017-07-17 DIAGNOSIS — G939 Disorder of brain, unspecified: Secondary | ICD-10-CM | POA: Insufficient documentation

## 2017-07-17 LAB — BASIC METABOLIC PANEL
ANION GAP: 8 (ref 5–15)
BUN: 23 mg/dL — ABNORMAL HIGH (ref 6–20)
CHLORIDE: 99 mmol/L — AB (ref 101–111)
CO2: 29 mmol/L (ref 22–32)
Calcium: 8.9 mg/dL (ref 8.9–10.3)
Creatinine, Ser: 0.99 mg/dL (ref 0.44–1.00)
GFR calc non Af Amer: 54 mL/min — ABNORMAL LOW (ref >60)
GLUCOSE: 134 mg/dL — AB (ref 65–99)
Potassium: 3.4 mmol/L — ABNORMAL LOW (ref 3.5–5.1)
Sodium: 136 mmol/L (ref 135–145)

## 2017-07-17 LAB — URIC ACID: Uric Acid, Serum: 11.3 mg/dL — ABNORMAL HIGH (ref 2.3–6.6)

## 2017-07-17 MED ORDER — ALLOPURINOL 300 MG PO TABS: 300.0000 mg | ORAL_TABLET | Freq: Every day | ORAL | 1 refills | Status: DC

## 2017-07-17 NOTE — Progress Notes (Signed)
Patient here today to discuss treatment plan.
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
## 2017-07-18 LAB — HIV ANTIBODY (ROUTINE TESTING W REFLEX): HIV Screen 4th Generation wRfx: NONREACTIVE

## 2017-07-18 LAB — HEPATITIS B SURFACE ANTIGEN: Hepatitis B Surface Ag: NEGATIVE

## 2017-07-18 LAB — HEPATITIS B CORE ANTIBODY, TOTAL: Hep B Core Total Ab: NEGATIVE

## 2017-07-18 LAB — HEPATITIS C ANTIBODY

## 2017-07-24 ENCOUNTER — Other Ambulatory Visit (HOSPITAL_COMMUNITY)
Admission: RE | Admit: 2017-07-24 | Disposition: A | Discharge status: Home / Self Care | Payer: Medicare Other | Source: Ambulatory Visit | Attending: Hematology and Oncology | Admitting: Hematology and Oncology

## 2017-07-24 ENCOUNTER — Ambulatory Visit
Admission: RE | Admit: 2017-07-24 | Discharge: 2017-07-24 | Disposition: A | Discharge status: Home / Self Care | Payer: Medicare Other | Source: Ambulatory Visit | Attending: Urgent Care | Admitting: Urgent Care

## 2017-07-24 DIAGNOSIS — G4730 Sleep apnea, unspecified: Secondary | ICD-10-CM | POA: Insufficient documentation

## 2017-07-24 DIAGNOSIS — Z853 Personal history of malignant neoplasm of breast: Secondary | ICD-10-CM | POA: Diagnosis not present

## 2017-07-24 DIAGNOSIS — J449 Chronic obstructive pulmonary disease, unspecified: Secondary | ICD-10-CM | POA: Insufficient documentation

## 2017-07-24 DIAGNOSIS — Z85828 Personal history of other malignant neoplasm of skin: Secondary | ICD-10-CM | POA: Diagnosis not present

## 2017-07-24 DIAGNOSIS — C8330 Diffuse large B-cell lymphoma, unspecified site: Secondary | ICD-10-CM | POA: Insufficient documentation

## 2017-07-24 DIAGNOSIS — K429 Umbilical hernia without obstruction or gangrene: Secondary | ICD-10-CM | POA: Insufficient documentation

## 2017-07-24 DIAGNOSIS — Z79899 Other long term (current) drug therapy: Secondary | ICD-10-CM | POA: Diagnosis not present

## 2017-07-24 DIAGNOSIS — I700 Atherosclerosis of aorta: Secondary | ICD-10-CM | POA: Insufficient documentation

## 2017-07-24 DIAGNOSIS — K760 Fatty (change of) liver, not elsewhere classified: Secondary | ICD-10-CM | POA: Insufficient documentation

## 2017-07-24 DIAGNOSIS — Z794 Long term (current) use of insulin: Secondary | ICD-10-CM | POA: Diagnosis not present

## 2017-07-24 DIAGNOSIS — D34 Benign neoplasm of thyroid gland: Secondary | ICD-10-CM | POA: Diagnosis not present

## 2017-07-24 DIAGNOSIS — K8020 Calculus of gallbladder without cholecystitis without obstruction: Secondary | ICD-10-CM | POA: Diagnosis not present

## 2017-07-24 DIAGNOSIS — I509 Heart failure, unspecified: Secondary | ICD-10-CM | POA: Insufficient documentation

## 2017-07-24 DIAGNOSIS — C8301 Small cell B-cell lymphoma, lymph nodes of head, face, and neck: Secondary | ICD-10-CM

## 2017-07-24 DIAGNOSIS — F419 Anxiety disorder, unspecified: Secondary | ICD-10-CM | POA: Diagnosis not present

## 2017-07-24 DIAGNOSIS — I2510 Atherosclerotic heart disease of native coronary artery without angina pectoris: Secondary | ICD-10-CM | POA: Insufficient documentation

## 2017-07-24 LAB — CBC WITH DIFFERENTIAL/PLATELET
Basophils Absolute: 0.1 10*3/uL (ref 0–0.1)
Basophils Relative: 1 %
EOS ABS: 0.2 10*3/uL (ref 0–0.7)
EOS PCT: 3 %
HCT: 36.2 % (ref 35.0–47.0)
Hemoglobin: 12.4 g/dL (ref 12.0–16.0)
LYMPHS PCT: 19 %
Lymphs Abs: 1.2 10*3/uL (ref 1.0–3.6)
MCH: 32.3 pg (ref 26.0–34.0)
MCHC: 34.2 g/dL (ref 32.0–36.0)
MCV: 94.4 fL (ref 80.0–100.0)
MONO ABS: 0.5 10*3/uL (ref 0.2–0.9)
Monocytes Relative: 8 %
Neutro Abs: 4.3 10*3/uL (ref 1.4–6.5)
Neutrophils Relative %: 69 %
PLATELETS: 208 10*3/uL (ref 150–440)
RBC: 3.83 MIL/uL (ref 3.80–5.20)
RDW: 13.9 % (ref 11.5–14.5)
WBC: 6.3 10*3/uL (ref 3.6–11.0)

## 2017-07-24 LAB — GLUCOSE, CAPILLARY: GLUCOSE-CAPILLARY: 136 mg/dL — AB (ref 65–99)

## 2017-07-24 MED ORDER — MIDAZOLAM HCL 5 MG/5ML IJ SOLN
INTRAMUSCULAR | Status: AC | PRN
  Administered 2017-07-24: 0.5 mg via INTRAVENOUS

## 2017-07-24 MED ORDER — MIDAZOLAM HCL 5 MG/5ML IJ SOLN
INTRAMUSCULAR | Status: AC
  Filled 2017-07-24: qty 5

## 2017-07-24 MED ORDER — MIDAZOLAM HCL 5 MG/5ML IJ SOLN
INTRAMUSCULAR | Status: AC | PRN
  Administered 2017-07-24: 1 mg via INTRAVENOUS

## 2017-07-24 MED ORDER — HEPARIN SOD (PORK) LOCK FLUSH 100 UNIT/ML IV SOLN
INTRAVENOUS | Status: AC
  Filled 2017-07-24: qty 5

## 2017-07-24 MED ORDER — HYDROCODONE-ACETAMINOPHEN 5-325 MG PO TABS: 1.0000 | ORAL_TABLET | ORAL | Status: DC | PRN

## 2017-07-24 MED ORDER — FENTANYL CITRATE (PF) 100 MCG/2ML IJ SOLN
INTRAMUSCULAR | Status: AC | PRN
  Administered 2017-07-24: 12.5 ug via INTRAVENOUS
  Administered 2017-07-24: 50 ug via INTRAVENOUS
  Administered 2017-07-24: 12.5 ug via INTRAVENOUS
  Administered 2017-07-24: 25 ug via INTRAVENOUS

## 2017-07-24 MED ORDER — FENTANYL CITRATE (PF) 100 MCG/2ML IJ SOLN
INTRAMUSCULAR | Status: AC
  Filled 2017-07-24: qty 2

## 2017-07-24 NOTE — Progress Notes (Signed)
Patient clinically stable post bone marrow biospy per Dr Anselm Pancoast, daughter at bedside. Dressing dry/intact, denies complaints. Eating sandwich and taking po's without difficulty. Dr Anselm Pancoast out to speak with daughter and patient with questions answered. Report called to [facility] regarding procedure with questions answered.discharge instructions given with questions answered.

## 2017-07-24 NOTE — Procedures (Signed)
CT guided bone marrow biopsy.  2 aspirates and 2 cores performed.  1 adequate core obtained.  Minimal blood loss and no immediate complication.

## 2017-07-24 NOTE — Discharge Instructions (Signed)
Bone Marrow Aspiration and Bone Marrow Biopsy, Adult, Care After °This sheet gives you information about how to care for yourself after your procedure. Your health care provider may also give you more specific instructions. If you have problems or questions, contact your health care provider. °What can I expect after the procedure? °After the procedure, it is common to have: °· Mild pain and tenderness. °· Swelling. °· Bruising. ° °Follow these instructions at home: °· Take over-the-counter or prescription medicines only as told by your health care provider. °· Do not take baths, swim, or use a hot tub until your health care provider approves. Ask if you can take a shower or have a sponge bath. °· Follow instructions from your health care provider about how to take care of the puncture site. Make sure you: °? Wash your hands with soap and water before you change your bandage (dressing). If soap and water are not available, use hand sanitizer. °? Change your dressing as told by your health care provider. °· Check your puncture site every day for signs of infection. Check for: °? More redness, swelling, or pain. °? More fluid or blood. °? Warmth. °? Pus or a bad smell. °· Return to your normal activities as told by your health care provider. Ask your health care provider what activities are safe for you. °· Do not drive for 24 hours if you were given a medicine to help you relax (sedative). °· Keep all follow-up visits as told by your health care provider. This is important. °Contact a health care provider if: °· You have more redness, swelling, or pain around the puncture site. °· You have more fluid or blood coming from the puncture site. °· Your puncture site feels warm to the touch. °· You have pus or a bad smell coming from the puncture site. °· You have a fever. °· Your pain is not controlled with medicine. °This information is not intended to replace advice given to you by your health care provider. Make sure  you discuss any questions you have with your health care provider. °Document Released: 12/09/2004 Document Revised: 12/10/2015 Document Reviewed: 11/03/2015 °Elsevier Interactive Patient Education © 2018 [employer] ° °

## 2017-07-24 NOTE — Sedation Documentation (Signed)
Total sedation: Versed 2 mg IV, Fentanyl 200 mcg IV. Sedation time: 28 min. Total.
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
## 2017-07-28 DIAGNOSIS — C8511 Unspecified B-cell lymphoma, lymph nodes of head, face, and neck: Secondary | ICD-10-CM | POA: Insufficient documentation

## 2017-07-28 DIAGNOSIS — E790 Hyperuricemia without signs of inflammatory arthritis and tophaceous disease: Secondary | ICD-10-CM | POA: Insufficient documentation
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
## 2017-07-30 ENCOUNTER — Encounter: Payer: Self-pay | Admitting: Hematology and Oncology

## 2017-07-30 ENCOUNTER — Inpatient Hospital Stay
Admission: EM | Admit: 2017-07-30 | Discharge: 2017-08-01 | DRG: 871 | Disposition: A | Discharge status: Skilled Nursing Facility | Payer: Medicare Other | Attending: Internal Medicine | Admitting: Internal Medicine

## 2017-07-30 ENCOUNTER — Emergency Department: Payer: Medicare Other

## 2017-07-30 ENCOUNTER — Encounter: Payer: Self-pay | Admitting: Emergency Medicine

## 2017-07-30 ENCOUNTER — Inpatient Hospital Stay (HOSPITAL_BASED_OUTPATIENT_CLINIC_OR_DEPARTMENT_OTHER): Payer: Medicare Other | Admitting: Hematology and Oncology

## 2017-07-30 ENCOUNTER — Other Ambulatory Visit: Payer: Self-pay

## 2017-07-30 VITALS — BP 145/78 | HR 96 | Temp 100.8°F | Resp 20

## 2017-07-30 DIAGNOSIS — C8510 Unspecified B-cell lymphoma, unspecified site: Secondary | ICD-10-CM | POA: Diagnosis present

## 2017-07-30 DIAGNOSIS — Z89612 Acquired absence of left leg above knee: Secondary | ICD-10-CM

## 2017-07-30 DIAGNOSIS — C8511 Unspecified B-cell lymphoma, lymph nodes of head, face, and neck: Secondary | ICD-10-CM | POA: Diagnosis not present

## 2017-07-30 DIAGNOSIS — I5032 Chronic diastolic (congestive) heart failure: Secondary | ICD-10-CM | POA: Diagnosis present

## 2017-07-30 DIAGNOSIS — Z85850 Personal history of malignant neoplasm of thyroid: Secondary | ICD-10-CM | POA: Diagnosis not present

## 2017-07-30 DIAGNOSIS — E119 Type 2 diabetes mellitus without complications: Secondary | ICD-10-CM | POA: Diagnosis present

## 2017-07-30 DIAGNOSIS — A419 Sepsis, unspecified organism: Secondary | ICD-10-CM | POA: Diagnosis not present

## 2017-07-30 DIAGNOSIS — Z993 Dependence on wheelchair: Secondary | ICD-10-CM

## 2017-07-30 DIAGNOSIS — Z66 Do not resuscitate: Secondary | ICD-10-CM | POA: Diagnosis present

## 2017-07-30 DIAGNOSIS — R062 Wheezing: Secondary | ICD-10-CM | POA: Diagnosis not present

## 2017-07-30 DIAGNOSIS — J111 Influenza due to unidentified influenza virus with other respiratory manifestations: Secondary | ICD-10-CM | POA: Diagnosis not present

## 2017-07-30 DIAGNOSIS — C3411 Malignant neoplasm of upper lobe, right bronchus or lung: Secondary | ICD-10-CM | POA: Diagnosis not present

## 2017-07-30 DIAGNOSIS — I110 Hypertensive heart disease with heart failure: Secondary | ICD-10-CM | POA: Diagnosis present

## 2017-07-30 DIAGNOSIS — C773 Secondary and unspecified malignant neoplasm of axilla and upper limb lymph nodes: Secondary | ICD-10-CM | POA: Diagnosis not present

## 2017-07-30 DIAGNOSIS — R509 Fever, unspecified: Secondary | ICD-10-CM

## 2017-07-30 DIAGNOSIS — J441 Chronic obstructive pulmonary disease with (acute) exacerbation: Secondary | ICD-10-CM | POA: Diagnosis present

## 2017-07-30 DIAGNOSIS — Z853 Personal history of malignant neoplasm of breast: Secondary | ICD-10-CM

## 2017-07-30 DIAGNOSIS — R0902 Hypoxemia: Secondary | ICD-10-CM | POA: Diagnosis not present

## 2017-07-30 DIAGNOSIS — J09X2 Influenza due to identified novel influenza A virus with other respiratory manifestations: Secondary | ICD-10-CM | POA: Diagnosis not present

## 2017-07-30 DIAGNOSIS — E790 Hyperuricemia without signs of inflammatory arthritis and tophaceous disease: Secondary | ICD-10-CM

## 2017-07-30 DIAGNOSIS — J101 Influenza due to other identified influenza virus with other respiratory manifestations: Secondary | ICD-10-CM | POA: Diagnosis present

## 2017-07-30 DIAGNOSIS — Z888 Allergy status to other drugs, medicaments and biological substances status: Secondary | ICD-10-CM | POA: Diagnosis not present

## 2017-07-30 DIAGNOSIS — J9601 Acute respiratory failure with hypoxia: Secondary | ICD-10-CM

## 2017-07-30 DIAGNOSIS — Z96651 Presence of right artificial knee joint: Secondary | ICD-10-CM | POA: Diagnosis present

## 2017-07-30 DIAGNOSIS — Z7951 Long term (current) use of inhaled steroids: Secondary | ICD-10-CM

## 2017-07-30 DIAGNOSIS — M1990 Unspecified osteoarthritis, unspecified site: Secondary | ICD-10-CM | POA: Diagnosis present

## 2017-07-30 DIAGNOSIS — G4730 Sleep apnea, unspecified: Secondary | ICD-10-CM | POA: Diagnosis present

## 2017-07-30 DIAGNOSIS — F419 Anxiety disorder, unspecified: Secondary | ICD-10-CM | POA: Diagnosis present

## 2017-07-30 DIAGNOSIS — R05 Cough: Secondary | ICD-10-CM

## 2017-07-30 DIAGNOSIS — Z79899 Other long term (current) drug therapy: Secondary | ICD-10-CM | POA: Diagnosis not present

## 2017-07-30 DIAGNOSIS — Z794 Long term (current) use of insulin: Secondary | ICD-10-CM | POA: Diagnosis not present

## 2017-07-30 DIAGNOSIS — I2721 Secondary pulmonary arterial hypertension: Secondary | ICD-10-CM | POA: Diagnosis present

## 2017-07-30 DIAGNOSIS — R0602 Shortness of breath: Secondary | ICD-10-CM | POA: Diagnosis not present

## 2017-07-30 DIAGNOSIS — R4182 Altered mental status, unspecified: Secondary | ICD-10-CM

## 2017-07-30 DIAGNOSIS — R059 Cough, unspecified: Secondary | ICD-10-CM

## 2017-07-30 DIAGNOSIS — C50912 Malignant neoplasm of unspecified site of left female breast: Secondary | ICD-10-CM | POA: Diagnosis not present

## 2017-07-30 DIAGNOSIS — Z7189 Other specified counseling: Secondary | ICD-10-CM | POA: Diagnosis not present

## 2017-07-30 LAB — CBC WITH DIFFERENTIAL/PLATELET
Basophils Absolute: 0 10*3/uL (ref 0–0.1)
Basophils Relative: 1 %
EOS ABS: 0.2 10*3/uL (ref 0–0.7)
Eosinophils Relative: 4 %
HEMATOCRIT: 37.6 % (ref 35.0–47.0)
HEMOGLOBIN: 12.8 g/dL (ref 12.0–16.0)
LYMPHS ABS: 0.7 10*3/uL — AB (ref 1.0–3.6)
LYMPHS PCT: 13 %
MCH: 32 pg (ref 26.0–34.0)
MCHC: 34 g/dL (ref 32.0–36.0)
MCV: 94 fL (ref 80.0–100.0)
MONOS PCT: 13 %
Monocytes Absolute: 0.8 10*3/uL (ref 0.2–0.9)
NEUTROS ABS: 4 10*3/uL (ref 1.4–6.5)
NEUTROS PCT: 69 %
Platelets: 189 10*3/uL (ref 150–440)
RBC: 4 MIL/uL (ref 3.80–5.20)
RDW: 14.6 % — ABNORMAL HIGH (ref 11.5–14.5)
WBC: 5.8 10*3/uL (ref 3.6–11.0)

## 2017-07-30 LAB — COMPREHENSIVE METABOLIC PANEL
ALK PHOS: 61 U/L (ref 38–126)
ALT: 40 U/L (ref 14–54)
ANION GAP: 10 (ref 5–15)
AST: 34 U/L (ref 15–41)
Albumin: 3.9 g/dL (ref 3.5–5.0)
BILIRUBIN TOTAL: 0.6 mg/dL (ref 0.3–1.2)
BUN: 16 mg/dL (ref 6–20)
CALCIUM: 9.2 mg/dL (ref 8.9–10.3)
CO2: 29 mmol/L (ref 22–32)
Chloride: 99 mmol/L — ABNORMAL LOW (ref 101–111)
Creatinine, Ser: 1.02 mg/dL — ABNORMAL HIGH (ref 0.44–1.00)
GFR, EST NON AFRICAN AMERICAN: 52 mL/min — AB (ref >60)
Glucose, Bld: 149 mg/dL — ABNORMAL HIGH (ref 65–99)
Potassium: 3.7 mmol/L (ref 3.5–5.1)
Sodium: 138 mmol/L (ref 135–145)
TOTAL PROTEIN: 7 g/dL (ref 6.5–8.1)

## 2017-07-30 LAB — BLOOD GAS, VENOUS
Acid-Base Excess: 4.3 mmol/L — ABNORMAL HIGH (ref 0.0–2.0)
Bicarbonate: 31.1 mmol/L — ABNORMAL HIGH (ref 20.0–28.0)
O2 Saturation: 90.4 %
PCO2 VEN: 55 mmHg (ref 44.0–60.0)
PH VEN: 7.36 (ref 7.250–7.430)
PO2 VEN: 62 mmHg — AB (ref 32.0–45.0)
Patient temperature: 37

## 2017-07-30 LAB — URINALYSIS, ROUTINE W REFLEX MICROSCOPIC
Bilirubin Urine: NEGATIVE
GLUCOSE, UA: NEGATIVE mg/dL
Ketones, ur: 5 mg/dL — AB
NITRITE: NEGATIVE
PH: 5 (ref 5.0–8.0)
Protein, ur: NEGATIVE mg/dL
Specific Gravity, Urine: 1.02 (ref 1.005–1.030)
Squamous Epithelial / LPF: NONE SEEN

## 2017-07-30 LAB — INFLUENZA PANEL BY PCR (TYPE A & B)
Influenza A By PCR: POSITIVE — AB
Influenza B By PCR: NEGATIVE

## 2017-07-30 LAB — LACTIC ACID, PLASMA
LACTIC ACID, VENOUS: 1.2 mmol/L (ref 0.5–1.9)
Lactic Acid, Venous: 1.2 mmol/L (ref 0.5–1.9)

## 2017-07-30 LAB — MRSA PCR SCREENING: MRSA by PCR: POSITIVE — AB

## 2017-07-30 LAB — GLUCOSE, CAPILLARY: GLUCOSE-CAPILLARY: 163 mg/dL — AB (ref 65–99)

## 2017-07-30 MED ORDER — ALPRAZOLAM 0.25 MG PO TABS: 0.2500 mg | ORAL_TABLET | Freq: Two times a day (BID) | ORAL | Status: DC | PRN

## 2017-07-30 MED ORDER — ARIPIPRAZOLE 5 MG PO TABS
5.0000 mg | ORAL_TABLET | Freq: Every day | ORAL | Status: DC
  Administered 2017-07-31 – 2017-08-01 (×2): 5 mg via ORAL
  Filled 2017-07-30 (×2): qty 1

## 2017-07-30 MED ORDER — INSULIN ASPART 100 UNIT/ML ~~LOC~~ SOLN
0.0000 [IU] | Freq: Three times a day (TID) | SUBCUTANEOUS | Status: DC
  Administered 2017-07-31 (×3): 3 [IU] via SUBCUTANEOUS
  Administered 2017-08-01: 5 [IU] via SUBCUTANEOUS
  Administered 2017-08-01: 12:00:00 3 [IU] via SUBCUTANEOUS
  Filled 2017-07-30 (×5): qty 1

## 2017-07-30 MED ORDER — ALLOPURINOL 300 MG PO TABS
300.0000 mg | ORAL_TABLET | Freq: Every day | ORAL | Status: DC
  Administered 2017-07-31 – 2017-08-01 (×2): 300 mg via ORAL
  Filled 2017-07-30 (×2): qty 1

## 2017-07-30 MED ORDER — VITAMIN D 1000 UNITS PO TABS
2000.0000 [IU] | ORAL_TABLET | Freq: Every day | ORAL | Status: DC
  Administered 2017-07-31 – 2017-08-01 (×2): 2000 [IU] via ORAL
  Filled 2017-07-30 (×2): qty 2

## 2017-07-30 MED ORDER — IPRATROPIUM-ALBUTEROL 0.5-2.5 (3) MG/3ML IN SOLN
3.0000 mL | Freq: Once | RESPIRATORY_TRACT | Status: AC
  Administered 2017-07-30: 3 mL via RESPIRATORY_TRACT
  Filled 2017-07-30: qty 3

## 2017-07-30 MED ORDER — SODIUM CHLORIDE 0.9 % IV BOLUS (SEPSIS)
1500.0000 mL | Freq: Once | INTRAVENOUS | Status: AC
  Administered 2017-07-30: 1500 mL via INTRAVENOUS

## 2017-07-30 MED ORDER — LETROZOLE 2.5 MG PO TABS
2.5000 mg | ORAL_TABLET | Freq: Every day | ORAL | Status: DC
  Administered 2017-07-31 – 2017-08-01 (×2): 2.5 mg via ORAL
  Filled 2017-07-30 (×2): qty 1

## 2017-07-30 MED ORDER — GUAIFENESIN 100 MG/5ML PO SYRP
10.0000 mL | ORAL_SOLUTION | Freq: Four times a day (QID) | ORAL | Status: DC | PRN
  Filled 2017-07-30: qty 10

## 2017-07-30 MED ORDER — METHYLPREDNISOLONE SODIUM SUCC 125 MG IJ SOLR
60.0000 mg | Freq: Four times a day (QID) | INTRAMUSCULAR | Status: DC
  Administered 2017-07-30 – 2017-07-31 (×3): 60 mg via INTRAVENOUS
  Filled 2017-07-30 (×3): qty 2

## 2017-07-30 MED ORDER — LISINOPRIL 5 MG PO TABS
2.5000 mg | ORAL_TABLET | Freq: Every day | ORAL | Status: DC
  Administered 2017-07-31 – 2017-08-01 (×2): 2.5 mg via ORAL
  Filled 2017-07-30 (×2): qty 1

## 2017-07-30 MED ORDER — OSELTAMIVIR PHOSPHATE 75 MG PO CAPS: 75.0000 mg | ORAL_CAPSULE | Freq: Two times a day (BID) | ORAL | Status: DC

## 2017-07-30 MED ORDER — FOLIC ACID 1 MG PO TABS
1.0000 mg | ORAL_TABLET | Freq: Every day | ORAL | Status: DC
  Administered 2017-07-31 – 2017-08-01 (×2): 1 mg via ORAL
  Filled 2017-07-30 (×2): qty 1

## 2017-07-30 MED ORDER — OSELTAMIVIR PHOSPHATE 75 MG PO CAPS
75.0000 mg | ORAL_CAPSULE | Freq: Once | ORAL | Status: AC
  Administered 2017-07-30: 75 mg via ORAL
  Filled 2017-07-30: qty 1

## 2017-07-30 MED ORDER — DOCUSATE SODIUM 100 MG PO CAPS
100.0000 mg | ORAL_CAPSULE | Freq: Two times a day (BID) | ORAL | Status: DC | PRN
Start: 2017-07-30 — End: 2017-08-01

## 2017-07-30 MED ORDER — MUPIROCIN 2 % EX OINT
1.0000 | TOPICAL_OINTMENT | Freq: Two times a day (BID) | CUTANEOUS | Status: DC
  Administered 2017-07-31 – 2017-08-01 (×4): 1 via NASAL
  Filled 2017-07-30: qty 22

## 2017-07-30 MED ORDER — IPRATROPIUM-ALBUTEROL 0.5-2.5 (3) MG/3ML IN SOLN
3.0000 mL | RESPIRATORY_TRACT | Status: DC
  Administered 2017-07-30 – 2017-07-31 (×4): 3 mL via RESPIRATORY_TRACT
  Filled 2017-07-30 (×5): qty 3

## 2017-07-30 MED ORDER — CHLORHEXIDINE GLUCONATE CLOTH 2 % EX PADS
6.0000 | MEDICATED_PAD | Freq: Every day | CUTANEOUS | Status: DC
  Administered 2017-07-31 – 2017-08-01 (×2): 6 via TOPICAL

## 2017-07-30 MED ORDER — METOPROLOL TARTRATE 50 MG PO TABS
50.0000 mg | ORAL_TABLET | Freq: Every day | ORAL | Status: DC
  Administered 2017-07-31 – 2017-08-01 (×2): 50 mg via ORAL
  Filled 2017-07-30 (×2): qty 1

## 2017-07-30 MED ORDER — ACETAMINOPHEN 500 MG PO TABS
1000.0000 mg | ORAL_TABLET | Freq: Once | ORAL | Status: AC
Start: 2017-07-30 — End: 2017-07-30
  Administered 2017-07-30: 1000 mg via ORAL
  Filled 2017-07-30: qty 2

## 2017-07-30 MED ORDER — CALCIUM CARBONATE 1250 (500 CA) MG PO TABS
3.0000 | ORAL_TABLET | Freq: Every day | ORAL | Status: DC
  Filled 2017-07-30: qty 3

## 2017-07-30 MED ORDER — HYDROCODONE-ACETAMINOPHEN 5-325 MG PO TABS: 2.0000 | ORAL_TABLET | ORAL | Status: DC | PRN

## 2017-07-30 MED ORDER — HEPARIN SODIUM (PORCINE) 5000 UNIT/ML IJ SOLN
5000.0000 [IU] | Freq: Three times a day (TID) | INTRAMUSCULAR | Status: DC
  Administered 2017-07-30 – 2017-08-01 (×5): 5000 [IU] via SUBCUTANEOUS
  Filled 2017-07-30 (×5): qty 1

## 2017-07-30 MED ORDER — ACETAMINOPHEN 325 MG PO TABS: 650.0000 mg | ORAL_TABLET | Freq: Four times a day (QID) | ORAL | Status: DC | PRN

## 2017-07-30 MED ORDER — OSELTAMIVIR PHOSPHATE 30 MG PO CAPS
30.0000 mg | ORAL_CAPSULE | Freq: Two times a day (BID) | ORAL | Status: DC
  Administered 2017-07-30: 22:00:00 30 mg via ORAL
  Filled 2017-07-30 (×2): qty 1

## 2017-07-30 MED ORDER — FERROUS SULFATE 325 (65 FE) MG PO TABS
325.0000 mg | ORAL_TABLET | Freq: Every day | ORAL | Status: DC
  Administered 2017-07-31 – 2017-08-01 (×2): 325 mg via ORAL
  Filled 2017-07-30 (×2): qty 1

## 2017-07-30 MED ORDER — INSULIN GLARGINE 100 UNIT/ML ~~LOC~~ SOLN
25.0000 [IU] | Freq: Every day | SUBCUTANEOUS | Status: DC
  Administered 2017-07-30: 22:00:00 25 [IU] via SUBCUTANEOUS
  Filled 2017-07-30 (×2): qty 0.25

## 2017-07-30 MED ORDER — TIOTROPIUM BROMIDE MONOHYDRATE 18 MCG IN CAPS
18.0000 ug | ORAL_CAPSULE | Freq: Every day | RESPIRATORY_TRACT | Status: DC
  Administered 2017-07-31 – 2017-08-01 (×2): 18 ug via RESPIRATORY_TRACT
  Filled 2017-07-30: qty 5

## 2017-07-30 MED ORDER — SENNOSIDES 8.6 MG PO TABS
2.0000 | ORAL_TABLET | Freq: Two times a day (BID) | ORAL | Status: DC
  Administered 2017-07-31 – 2017-08-01 (×3): 17.2 mg via ORAL
  Filled 2017-07-30 (×5): qty 2

## 2017-07-30 MED ORDER — GABAPENTIN 300 MG PO CAPS
300.0000 mg | ORAL_CAPSULE | Freq: Three times a day (TID) | ORAL | Status: DC
  Administered 2017-07-30 – 2017-08-01 (×5): 300 mg via ORAL
  Filled 2017-07-30 (×5): qty 1

## 2017-07-30 MED ORDER — VENLAFAXINE HCL ER 75 MG PO CP24
150.0000 mg | ORAL_CAPSULE | Freq: Every day | ORAL | Status: DC
  Administered 2017-07-31 – 2017-08-01 (×2): 150 mg via ORAL
  Filled 2017-07-30 (×2): qty 2

## 2017-07-30 MED ORDER — GUAIFENESIN 100 MG/5ML PO SOLN
5.0000 mL | Freq: Four times a day (QID) | ORAL | Status: DC | PRN
  Filled 2017-07-30: qty 10

## 2017-07-30 NOTE — Progress Notes (Signed)
Pharmacist-Provider Communication:  Order for tamiflu 75 mg PO BID changed to 30 mg PO BID x 5 days per protocol for CrCl 54 mL/min.  Lenis Noon, PharmD 07/30/17 5:13 PM

## 2017-07-30 NOTE — ED Notes (Signed)
Pt presents from cancer center with wheezing and chest pain. Pt was going for a consult and was sent over due to wheezing. Pt has hx of chf and copd; flu swab positive. Pt placed on droplet precautions. Placed on stretcher using lift in [place].

## 2017-07-30 NOTE — ED Triage Notes (Signed)
Cough since yesterday. SAT 93 room air after treatment. Had treatment at [facility] this am.

## 2017-07-30 NOTE — ED Notes (Signed)
CODE SEPSIS 

## 2017-07-30 NOTE — ED Notes (Signed)
Report called to matt rn floor nurse

## 2017-07-30 NOTE — Progress Notes (Signed)
Patient states she developed a cold yesterday.  Temp 100.8,  Audible wheezing.  Patient wearing a mask.  Accompanied by her daughter today. Patient states she is having phanthom pain today.  Also sacral pain 10/10.
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
## 2017-07-30 NOTE — ED Notes (Signed)
Nurse unable to take report  

## 2017-07-30 NOTE — Progress Notes (Signed)
Family Meeting Note  Advance Directive:yes  Today a meeting took place with the Patient and daughter.   The following clinical team members were present during this meeting:MD  The following were discussed:Patient's diagnosis: Lymphoma, CHF, COPD, influenza., Patient's progosis: Unable to determine and Goals for treatment: DNR  Additional follow-up to be provided: Oncology.  Time spent during discussion:20 minutes  Vaughan Basta, MD
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
## 2017-07-31 DIAGNOSIS — E790 Hyperuricemia without signs of inflammatory arthritis and tophaceous disease: Secondary | ICD-10-CM

## 2017-07-31 DIAGNOSIS — C8511 Unspecified B-cell lymphoma, lymph nodes of head, face, and neck: Secondary | ICD-10-CM

## 2017-07-31 DIAGNOSIS — J09X2 Influenza due to identified novel influenza A virus with other respiratory manifestations: Secondary | ICD-10-CM

## 2017-07-31 DIAGNOSIS — C50912 Malignant neoplasm of unspecified site of left female breast: Secondary | ICD-10-CM

## 2017-07-31 LAB — GLUCOSE, CAPILLARY
GLUCOSE-CAPILLARY: 240 mg/dL — AB (ref 65–99)
Glucose-Capillary: 230 mg/dL — ABNORMAL HIGH (ref 65–99)
Glucose-Capillary: 228 mg/dL — ABNORMAL HIGH (ref 65–99)

## 2017-07-31 LAB — BASIC METABOLIC PANEL
ANION GAP: 8 (ref 5–15)
BUN: 13 mg/dL (ref 6–20)
CHLORIDE: 106 mmol/L (ref 101–111)
CO2: 25 mmol/L (ref 22–32)
Calcium: 8.4 mg/dL — ABNORMAL LOW (ref 8.9–10.3)
Creatinine, Ser: 0.82 mg/dL (ref 0.44–1.00)
GFR calc non Af Amer: >60 mL/min (ref >60)
Glucose, Bld: 230 mg/dL — ABNORMAL HIGH (ref 65–99)
POTASSIUM: 3.9 mmol/L (ref 3.5–5.1)
Sodium: 139 mmol/L (ref 135–145)

## 2017-07-31 LAB — CBC
HEMATOCRIT: 34.6 % — AB (ref 35.0–47.0)
HEMOGLOBIN: 11.7 g/dL — AB (ref 12.0–16.0)
MCH: 32.2 pg (ref 26.0–34.0)
MCHC: 33.8 g/dL (ref 32.0–36.0)
MCV: 95.2 fL (ref 80.0–100.0)
Platelets: 173 10*3/uL (ref 150–440)
RBC: 3.64 MIL/uL — AB (ref 3.80–5.20)
RDW: 14.4 % (ref 11.5–14.5)
WBC: 3.5 10*3/uL — ABNORMAL LOW (ref 3.6–11.0)

## 2017-07-31 MED ORDER — METHYLPREDNISOLONE SODIUM SUCC 125 MG IJ SOLR
60.0000 mg | Freq: Two times a day (BID) | INTRAMUSCULAR | Status: DC
  Administered 2017-07-31 – 2017-08-01 (×2): 60 mg via INTRAVENOUS
  Filled 2017-07-31 (×2): qty 2

## 2017-07-31 MED ORDER — CALCIUM CARBONATE ANTACID 500 MG PO CHEW
1200.0000 mg | CHEWABLE_TABLET | Freq: Every day | ORAL | Status: DC
  Administered 2017-07-31 – 2017-08-01 (×2): 1200 mg via ORAL
  Filled 2017-07-31 (×3): qty 6

## 2017-07-31 MED ORDER — ALBUTEROL SULFATE (2.5 MG/3ML) 0.083% IN NEBU: 2.5000 mg | INHALATION_SOLUTION | RESPIRATORY_TRACT | Status: DC | PRN

## 2017-07-31 MED ORDER — IPRATROPIUM-ALBUTEROL 0.5-2.5 (3) MG/3ML IN SOLN
3.0000 mL | Freq: Four times a day (QID) | RESPIRATORY_TRACT | Status: DC
  Administered 2017-07-31 – 2017-08-01 (×4): 3 mL via RESPIRATORY_TRACT
  Filled 2017-07-31 (×4): qty 3

## 2017-07-31 MED ORDER — POTASSIUM CHLORIDE CRYS ER 20 MEQ PO TBCR
20.0000 meq | EXTENDED_RELEASE_TABLET | Freq: Every day | ORAL | Status: DC
  Administered 2017-07-31 – 2017-08-01 (×2): 20 meq via ORAL
  Filled 2017-07-31 (×2): qty 1

## 2017-07-31 MED ORDER — OSELTAMIVIR PHOSPHATE 75 MG PO CAPS
75.0000 mg | ORAL_CAPSULE | Freq: Two times a day (BID) | ORAL | Status: DC
  Administered 2017-07-31 – 2017-08-01 (×3): 75 mg via ORAL
  Filled 2017-07-31 (×4): qty 1

## 2017-07-31 MED ORDER — TORSEMIDE 20 MG PO TABS
80.0000 mg | ORAL_TABLET | Freq: Every day | ORAL | Status: DC
Start: 2017-07-31 — End: 2017-08-01
  Administered 2017-07-31 – 2017-08-01 (×2): 80 mg via ORAL
  Filled 2017-07-31 (×2): qty 4

## 2017-07-31 MED ORDER — INSULIN GLARGINE 100 UNIT/ML ~~LOC~~ SOLN
34.0000 [IU] | Freq: Every day | SUBCUTANEOUS | Status: DC
  Administered 2017-07-31: 34 [IU] via SUBCUTANEOUS
  Filled 2017-07-31 (×2): qty 0.34

## 2017-07-31 NOTE — Progress Notes (Signed)
[facility] [place] NAME: Melissa Mcdonald    MR#:  [id number]  DATE OF BIRTH:  1940/07/07  SUBJECTIVE:  CHIEF COMPLAINT:   Chief Complaint  Patient presents with  . Cough   Still has shortness of breath and wheezing with cough.  REVIEW OF SYSTEMS:    Review of Systems  Constitutional: Positive for malaise/fatigue. Negative for chills and fever.  HENT: Negative for sore throat.   Eyes: Negative for blurred vision, double vision and pain.  Respiratory: Positive for cough, shortness of breath and wheezing. Negative for hemoptysis.   Cardiovascular: Negative for chest pain, palpitations, orthopnea and leg swelling.  Gastrointestinal: Negative for abdominal pain, constipation, diarrhea, heartburn, nausea and vomiting.  Genitourinary: Negative for dysuria and hematuria.  Musculoskeletal: Negative for back pain and joint pain.  Skin: Negative for rash.  Neurological: Positive for weakness. Negative for sensory change, speech change, focal weakness and headaches.  Endo/Heme/Allergies: Does not bruise/bleed easily.  Psychiatric/Behavioral: Negative for depression. The patient is not nervous/anxious.     DRUG ALLERGIES:   Allergies  Allergen Reactions  . Pantoprazole Sodium Diarrhea  . Aleve [Naproxen Sodium] Swelling  . Iron Nausea And Vomiting    "Oral Iron" per patient    VITALS:  Blood pressure (!) 112/53, pulse 78, temperature 99.6 F (37.6 C), temperature source Axillary, resp. rate 18, height 5' (1.524 m), weight 112.5 kg (248 lb), SpO2 97 %.  PHYSICAL EXAMINATION:   Physical Exam  GENERAL:  77 y.o.-year-old patient lying in the bed.  Obese with conversational dyspnea EYES: Pupils equal, round, reactive to light and accommodation. No scleral icterus. Extraocular muscles intact.  HEENT: Head atraumatic, normocephalic. Oropharynx and nasopharynx clear.  NECK:  Supple, no jugular venous distention. No thyroid enlargement, no  tenderness.  LUNGS: Bilateral wheezing and decreased air entry CARDIOVASCULAR: S1, S2 normal. No murmurs, rubs, or gallops.  ABDOMEN: Soft, nontender, nondistended. Bowel sounds present. No organomegaly or mass.  EXTREMITIES: No cyanosis, clubbing or edema b/l.  Left below-knee amputation NEUROLOGIC: Cranial nerves II through XII are intact.  Normal motor strength in upper extremities PSYCHIATRIC: The patient is alert and oriented x 3.  SKIN: No obvious rash, lesion, or ulcer.   LABORATORY PANEL:   CBC Recent Labs  Lab 07/31/17 0517  WBC 3.5*  HGB 11.7*  HCT 34.6*  PLT 173   ------------------------------------------------------------------------------------------------------------------ Chemistries  Recent Labs  Lab 07/30/17 1226 07/31/17 0517  NA 138 139  K 3.7 3.9  CL 99* 106  CO2 29 25  GLUCOSE 149* 230*  BUN 16 13  CREATININE 1.02* 0.82  CALCIUM 9.2 8.4*  AST 34  --   ALT 40  --   ALKPHOS 61  --   BILITOT 0.6  --    ------------------------------------------------------------------------------------------------------------------  Cardiac Enzymes No results for input(s): TROPONINI in the last 168 hours. ------------------------------------------------------------------------------------------------------------------  RADIOLOGY:  Dg Chest 2 View  Result Date: 07/30/2017 CLINICAL DATA:  Cough and shortness of breath. History of left breast carcinoma EXAM: CHEST  2 VIEW COMPARISON:  PET-CT Nov 03, 2015 FINDINGS: There is no edema or consolidation. Heart is enlarged with pulmonary vascularity within normal limits. No adenopathy. There is degenerative change in the thoracic spine. IMPRESSION: Cardiac enlargement. No edema or consolidation. No evident adenopathy. Electronically Signed   By: Lowella Grip III M.D.   On: 07/30/2017 13:13     ASSESSMENT AND PLAN:   * Influenza A with sepsis present on admission   Tamiflu.  *  COPD exacerbation -IV steroids -  Scheduled Nebulizers - Inhalers -Wean O2 as tolerated - Consult pulmonary if no improvement  * Chronic congestive Congestive heart failure Restart torsemide from home.  * History of breast and thyroid cancer, recent diagnosis of lymphoma   CT-guided biopsy was done last week, they were supposed to follow in [place] Waiting for oncology input  * Diabetes Home dose of Lantus 34 units.  Sliding scale insulin. Patient is on IV steroids.  * Hypertension   Continue home medication.  All the records are reviewed and case discussed with Care Management/Social Workerr. Management plans discussed with the patient, family and they are in agreement.  CODE STATUS: FULL CODE  DVT Prophylaxis: SCDs  TOTAL TIME TAKING CARE OF THIS PATIENT: 35 minutes.   POSSIBLE D/C IN 1-2 DAYS, DEPENDING ON CLINICAL CONDITION.  Leia Alf Cherylanne Ardelean M.D on 07/31/2017 at 11:27 AM  Between 7am to 6pm - Pager - [phone]  After 6pm go to [email or web address] - password [email or web address] [facility]  Office  [phone]  CC: Primary care physician; Donato Schultz, MD  Note: This dictation was prepared with Dragon dictation along with smaller phrase technology. Any transcriptional errors that result from this process are unintentional.
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
## 2017-07-31 NOTE — Progress Notes (Signed)
Pharmacist-Provider Communication:  Order for tamiflu increased to 75 mg PO BID per protocol given improvement in renal function.  Lenis Noon, PharmD 07/31/17 8:05 AM

## 2017-07-31 NOTE — Care Management (Signed)
From [facility]. Recent diagnosis of lymphoma with biopsy. Chronic Cpap at night. Wheelchair bound. left BKA.  Supplemental 02 acute

## 2017-07-31 NOTE — Plan of Care (Signed)
  Education: Knowledge of General Education information will improve 07/31/2017 0427 - Progressing by Jeffie Pollock, RN   Activity: Risk for activity intolerance will decrease 07/31/2017 0427 - Progressing by Jeffie Pollock, RN   Safety: Ability to remain free from injury will improve 07/31/2017 0427 - Progressing by Jeffie Pollock, RN   Clinical Measurements: Signs and symptoms of infection will decrease 07/31/2017 0427 - Progressing by Jeffie Pollock, RN   Respiratory: Ability to maintain adequate ventilation will improve 07/31/2017 0427 - Progressing by Jeffie Pollock, RN

## 2017-08-01 DIAGNOSIS — Z7189 Other specified counseling: Secondary | ICD-10-CM

## 2017-08-01 DIAGNOSIS — J111 Influenza due to unidentified influenza virus with other respiratory manifestations: Secondary | ICD-10-CM

## 2017-08-01 DIAGNOSIS — J9601 Acute respiratory failure with hypoxia: Secondary | ICD-10-CM

## 2017-08-01 LAB — GLUCOSE, CAPILLARY
Glucose-Capillary: 258 mg/dL — ABNORMAL HIGH (ref 65–99)
Glucose-Capillary: 229 mg/dL — ABNORMAL HIGH (ref 65–99)

## 2017-08-01 LAB — URIC ACID: Uric Acid, Serum: 6.1 mg/dL (ref 2.3–6.6)

## 2017-08-01 MED ORDER — PREDNISONE 10 MG (21) PO TBPK: ORAL_TABLET | ORAL | 0 refills | Status: DC

## 2017-08-01 MED ORDER — OSELTAMIVIR PHOSPHATE 75 MG PO CAPS: 75.0000 mg | ORAL_CAPSULE | Freq: Two times a day (BID) | ORAL | 0 refills | Status: DC

## 2017-08-01 NOTE — Progress Notes (Signed)
Report called to [place] at [facility]. EMS called for transport. Madlyn Frankel, RN
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
## 2017-08-01 NOTE — Clinical Social Work Note (Signed)
Clinical Social Work Assessment  Patient Details  Name: Melissa Mcdonald MRN: [id number] Date of Birth: [birth year]  Date of referral:  08/01/17               Reason for consult:  Facility Placement                Permission sought to share information with:  Family Supports, Customer service manager Permission granted to share information::  Yes, Verbal Permission Granted  Name::     Melissa Mcdonald Daughter   [phone]   Agency::  SNF admissions  Relationship::     Contact Information:     Housing/Transportation Living arrangements for the past 2 months:  [place] of Information:  Adult Children, Medical Team Patient Interpreter Needed:  None Criminal Activity/Legal Involvement Pertinent to Current Situation/Hospitalization:  No - Comment as needed Significant Relationships:  Adult Children Lives with:  Facility Resident Do you feel safe going back to the place where you live?  Yes Need for family participation in patient care:  No (Coment)  Care giving concerns:  Patient's family did not express any concerns about returning back to SNF.   Social Worker assessment / plan: Patient is a 77 year old female who is a long term car resident at [facility].  Patient is alert and oriented x4, patient's daughter was at bedside.  Patient's family reports they are satisfied with SNF, and did not express any concerns.  Patient's family were explained role of CSW and process for coordinating SNF placement.  Patient gave CSW permission to give discharge paperwork to SNF.  Patient's family did not express any other questions or concerns.  Employment status:  Retired Nurse, adult, Medicaid [place] PT Recommendations:  Not assessed at this time [place] / Referral to community resources:  [place]  Patient/Family's Response to care:  Patient and family agreeable with returning back to [facility]  Patient/Family's Understanding of and Emotional Response to Diagnosis, Current Treatment, and Prognosis:  Patient's family is aware of current treatment plan and prognosis.  Emotional Assessment Appearance:  Appears stated age Attitude/Demeanor/Rapport:    Affect (typically observed):  Appropriate, Calm, Stable Orientation:  Oriented to Self, Oriented to Place, Oriented to  Time, Oriented to Situation Alcohol / Substance use:  Not Applicable Psych involvement (Current and /or in the community):  No (Comment)  Discharge Needs  Concerns to be addressed:  Care Coordination Readmission within the last 30 days:  No Current discharge risk:  None Barriers to Discharge:  No Barriers Identified   Ross Ludwig, LCSWA 08/01/2017, 12:31 PM

## 2017-08-01 NOTE — Progress Notes (Signed)
Patient discharged via EMS. Hutchinson Isenberg S, RN  

## 2017-08-01 NOTE — NC FL2 (Signed)
[place] LEVEL OF CARE SCREENING TOOL     IDENTIFICATION  Patient Name: Melissa Mcdonald Birthdate: March 29, 1941 Sex: female Admission Date (Current Location): 07/30/2017  [place] and [place] Number:  Melissa Mcdonald [id number] [place] and Address:  [facility], [address] [place], [place] [address]      Provider Number: [id number]  Attending Physician Name and Address:  Hillary Bow, MD  Relative Name and Phone Number:  [name] Daughter   [phone]     Current Level of Care: Hospital Recommended Level of Care: [place] Prior Approval Number:    Date Approved/Denied:   PASRR Number: [id number]  Discharge Plan: SNF    Current Diagnoses: Patient Active Problem List   Diagnosis Date Noted  . Acute respiratory failure with hypoxia ([place]) 07/30/2017  . Sepsis ([place]) 07/30/2017  . Influenza A 07/30/2017  . 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 Elevated uric acid in blood 07/28/2017  . Hyperuricemia 07/17/2017  . Abnormal CT of the head 06/08/2017  . RUQ fullness 12/15/2016  . Thyroid nodule 11/12/2015  . Abnormal PET scan of mediastinum 11/03/2015  . Parotid nodule 11/03/2015  . Hypocalcemia 09/19/2015  . Iron deficiency anemia 12/05/2013  . Breast cancer ([place]) 01/08/2013    Orientation RESPIRATION BLADDER Height & Weight     Self, Time, Situation, Place  Normal Incontinent Weight: 247 lb 6.4 oz (112.2 kg) Height:  5' (152.4 cm)  BEHAVIORAL SYMPTOMS/MOOD NEUROLOGICAL BOWEL NUTRITION STATUS      Incontinent Diet(Cardiac carb modified)  AMBULATORY STATUS COMMUNICATION OF NEEDS Skin   Total Care Verbally Normal                       Personal Care Assistance Level of Assistance  Bathing, Feeding, Dressing, Total care Bathing Assistance: Maximum assistance Feeding assistance: Maximum assistance Dressing Assistance: Maximum assistance Total Care Assistance: Maximum  assistance   Functional Limitations Info  Sight, Hearing, Speech Sight Info: Adequate Hearing Info: Adequate Speech Info: Adequate    SPECIAL CARE FACTORS FREQUENCY                       Contractures Contractures Info: Not present    Additional Factors Info  Code Status, Allergies, Psychotropic, Insulin Sliding Scale, Isolation Precautions Code Status Info: Partial Code Allergies Info: PANTOPRAZOLE SODIUM, ALEVE NAPROXEN SODIUM, IRON  Psychotropic Info: ARIPiprazole (ABILIFY) tablet 5 mg and venlafaxine XR (EFFEXOR-XR) 24 hr capsule 150 mg  Insulin Sliding Scale Info: insulin aspart (novoLOG) injection 0-9 Units 3x a day with meals Isolation Precautions Info: Droplet and contact for history of MRSA     Current Medications (08/01/2017):  This is the current hospital active medication list Current Facility-Administered Medications  Medication Dose Route Frequency Provider Last Rate Last Dose  . acetaminophen (TYLENOL) tablet 650 mg  650 mg Oral Q6H PRN Vaughan Basta, MD      . albuterol (PROVENTIL) (2.5 MG/3ML) 0.083% nebulizer solution 2.5 mg  2.5 mg Nebulization Q4H PRN Sudini, Alveta Heimlich, MD      . allopurinol (ZYLOPRIM) tablet 300 mg  300 mg Oral Daily Vaughan Basta, MD   300 mg at 08/01/17 1207  . ALPRAZolam Duanne Moron) tablet 0.25 mg  0.25 mg Oral Q12H PRN Vaughan Basta, MD      . ARIPiprazole (ABILIFY) tablet 5 mg  5 mg Oral Daily Vaughan Basta, MD   5 mg at 08/01/17 1208  . calcium carbonate (TUMS - dosed in  mg elemental calcium) chewable tablet 1,200 mg of elemental calcium  1,200 mg of elemental calcium Oral Q breakfast Lenis Noon, RPH   1,200 mg of elemental calcium at 08/01/17 1215  . Chlorhexidine Gluconate Cloth 2 % PADS 6 each  6 each Topical Q0600 Vaughan Basta, MD   6 each at 08/01/17 0600  . cholecalciferol (VITAMIN D) tablet 2,000 Units  2,000 Units Oral Daily Vaughan Basta, MD   2,000 Units at 08/01/17 1211  .  docusate sodium (COLACE) capsule 100 mg  100 mg Oral BID PRN Vaughan Basta, MD      . ferrous sulfate tablet 325 mg  325 mg Oral Q breakfast Vaughan Basta, MD   325 mg at 08/01/17 1212  . folic acid (FOLVITE) tablet 1 mg  1 mg Oral Daily Vaughan Basta, MD   1 mg at 08/01/17 1212  . gabapentin (NEURONTIN) capsule 300 mg  300 mg Oral TID Vaughan Basta, MD   300 mg at 08/01/17 1226  . guaiFENesin (ROBITUSSIN) 100 MG/5ML solution 100 mg  5 mL Oral Q6H PRN Lenis Noon, RPH      . heparin injection 5,000 Units  5,000 Units Subcutaneous Q8H Vaughan Basta, MD   5,000 Units at 08/01/17 0600  . HYDROcodone-acetaminophen (NORCO/VICODIN) 5-325 MG per tablet 2 tablet  2 tablet Oral Q4H PRN Vaughan Basta, MD      . insulin aspart (novoLOG) injection 0-9 Units  0-9 Units Subcutaneous TID WC Vaughan Basta, MD   3 Units at 08/01/17 1226  . insulin glargine (LANTUS) injection 34 Units  34 Units Subcutaneous QHS Hillary Bow, MD   34 Units at 07/31/17 2327  . ipratropium-albuterol (DUONEB) 0.5-2.5 (3) MG/3ML nebulizer solution 3 mL  3 mL Nebulization Q6H Sudini, Srikar, MD   3 mL at 08/01/17 0818  . letrozole [facility]) tablet 2.5 mg  2.5 mg Oral Daily Vaughan Basta, MD   2.5 mg at 08/01/17 1208  . lisinopril (PRINIVIL,ZESTRIL) tablet 2.5 mg  2.5 mg Oral Daily Vaughan Basta, MD   2.5 mg at 08/01/17 1213  . methylPREDNISolone sodium succinate (SOLU-MEDROL) 125 mg/2 mL injection 60 mg  60 mg Intravenous Q12H Hillary Bow, MD   60 mg at 08/01/17 1207  . metoprolol tartrate (LOPRESSOR) tablet 50 mg  50 mg Oral Daily Vaughan Basta, MD   50 mg at 08/01/17 1212  . mupirocin ointment (BACTROBAN) 2 % 1 application  1 application Nasal BID Vaughan Basta, MD   1 application at 29/93/71 1222  . oseltamivir (TAMIFLU) capsule 75 mg  75 mg Oral BID Lenis Noon, RPH   75 mg at 08/01/17 1207  . potassium chloride SA  (K-DUR,KLOR-CON) CR tablet 20 mEq  20 mEq Oral Daily Sudini, Alveta Heimlich, MD   20 mEq at 08/01/17 1211  . senna (SENOKOT) tablet 17.2 mg  2 tablet Oral BID Vaughan Basta, MD   17.2 mg at 08/01/17 1207  . tiotropium (SPIRIVA) inhalation capsule 18 mcg  18 mcg Inhalation Daily Vaughan Basta, MD   18 mcg at 08/01/17 1209  . torsemide (DEMADEX) tablet 80 mg  80 mg Oral Daily Hillary Bow, MD   80 mg at 08/01/17 1211  . venlafaxine XR (EFFEXOR-XR) 24 hr capsule 150 mg  150 mg Oral Q breakfast Vaughan Basta, MD   150 mg at 08/01/17 1214     Discharge Medications: Please see discharge summary for a list of discharge medications.  Relevant Imaging Results:  Relevant Lab Results:   Additional Information SSN  [id number]  Ross Ludwig, LCSWA

## 2017-08-01 NOTE — Clinical Social Work Note (Addendum)
Patient to be d/c'ed today to [facility].  Patient and family agreeable to plans will transport via ems RN to call report [phone].  Patient's daughter [name] has been notified and was at bedside.  Evette Cristal, MSW, [place]

## 2017-08-01 NOTE — Discharge Instructions (Signed)
Resume diet and activity as before ° ° °

## 2017-08-01 NOTE — Care Management Important Message (Signed)
Important Message  Patient Details  Name: Melissa Mcdonald MRN: [id number] Date of Birth: 01/31/1941   Medicare Important Message Given:  Yes    Shelbie Ammons, RN 08/01/2017, 7:46 AM
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
## 2017-08-02 LAB — GLUCOSE, CAPILLARY: Glucose-Capillary: 267 mg/dL — ABNORMAL HIGH (ref 65–99)

## 2017-08-02 LAB — URINE CULTURE

## 2017-08-02 LAB — SURGICAL PATHOLOGY

## 2017-08-04 LAB — CULTURE, BLOOD (ROUTINE X 2)
CULTURE: NO GROWTH
Culture: NO GROWTH
Special Requests: ADEQUATE
Special Requests: ADEQUATE

## 2017-08-08 ENCOUNTER — Encounter (HOSPITAL_COMMUNITY): Payer: Self-pay

## 2017-08-09 LAB — CHROMOSOME ANALYSIS, BONE MARROW

## 2017-08-21 ENCOUNTER — Encounter: Payer: Self-pay | Admitting: Hematology and Oncology

## 2017-08-21 ENCOUNTER — Inpatient Hospital Stay: Payer: Medicare Other | Attending: Hematology and Oncology | Admitting: Hematology and Oncology

## 2017-08-21 VITALS — BP 117/72 | HR 82 | Temp 98.2°F | Resp 18

## 2017-08-21 DIAGNOSIS — C773 Secondary and unspecified malignant neoplasm of axilla and upper limb lymph nodes: Secondary | ICD-10-CM

## 2017-08-21 DIAGNOSIS — D509 Iron deficiency anemia, unspecified: Secondary | ICD-10-CM | POA: Diagnosis not present

## 2017-08-21 DIAGNOSIS — Z9851 Tubal ligation status: Secondary | ICD-10-CM | POA: Insufficient documentation

## 2017-08-21 DIAGNOSIS — C50412 Malignant neoplasm of upper-outer quadrant of left female breast: Secondary | ICD-10-CM | POA: Diagnosis not present

## 2017-08-21 DIAGNOSIS — Z8041 Family history of malignant neoplasm of ovary: Secondary | ICD-10-CM | POA: Diagnosis not present

## 2017-08-21 DIAGNOSIS — Z9889 Other specified postprocedural states: Secondary | ICD-10-CM | POA: Insufficient documentation

## 2017-08-21 DIAGNOSIS — I509 Heart failure, unspecified: Secondary | ICD-10-CM | POA: Diagnosis not present

## 2017-08-21 DIAGNOSIS — C8511 Unspecified B-cell lymphoma, lymph nodes of head, face, and neck: Secondary | ICD-10-CM | POA: Diagnosis not present

## 2017-08-21 DIAGNOSIS — Z8249 Family history of ischemic heart disease and other diseases of the circulatory system: Secondary | ICD-10-CM | POA: Insufficient documentation

## 2017-08-21 DIAGNOSIS — J449 Chronic obstructive pulmonary disease, unspecified: Secondary | ICD-10-CM | POA: Insufficient documentation

## 2017-08-21 DIAGNOSIS — C50912 Malignant neoplasm of unspecified site of left female breast: Secondary | ICD-10-CM | POA: Insufficient documentation

## 2017-08-21 DIAGNOSIS — E790 Hyperuricemia without signs of inflammatory arthritis and tophaceous disease: Secondary | ICD-10-CM

## 2017-08-21 DIAGNOSIS — Z170 Estrogen receptor positive status [ER+]: Secondary | ICD-10-CM | POA: Insufficient documentation

## 2017-08-21 DIAGNOSIS — Z823 Family history of stroke: Secondary | ICD-10-CM | POA: Diagnosis not present

## 2017-08-21 DIAGNOSIS — Z7189 Other specified counseling: Secondary | ICD-10-CM
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
## 2017-08-21 NOTE — Progress Notes (Signed)
Patient offers no complaints today. 

## 2017-08-22 ENCOUNTER — Encounter: Payer: Self-pay | Admitting: Hematology and Oncology

## 2017-08-23 ENCOUNTER — Encounter: Payer: Self-pay | Admitting: Hematology and Oncology

## 2017-09-04 ENCOUNTER — Other Ambulatory Visit: Payer: Self-pay | Admitting: Cardiology

## 2017-09-04 DIAGNOSIS — Z01818 Encounter for other preprocedural examination: Secondary | ICD-10-CM

## 2017-09-04 DIAGNOSIS — R0602 Shortness of breath: Secondary | ICD-10-CM

## 2017-09-10 ENCOUNTER — Ambulatory Visit
Admission: RE | Admit: 2017-09-10 | Discharge: 2017-09-10 | Disposition: A | Discharge status: Home / Self Care | Payer: Medicare Other | Source: Ambulatory Visit | Attending: Cardiology | Admitting: Cardiology

## 2017-09-10 ENCOUNTER — Encounter
Admission: RE | Admit: 2017-09-10 | Discharge: 2017-09-10 | Disposition: A | Discharge status: Home / Self Care | Payer: Medicare Other | Source: Ambulatory Visit | Attending: Cardiology | Admitting: Cardiology

## 2017-09-10 DIAGNOSIS — R0602 Shortness of breath: Secondary | ICD-10-CM

## 2017-09-10 DIAGNOSIS — Z01818 Encounter for other preprocedural examination: Secondary | ICD-10-CM

## 2017-09-10 DIAGNOSIS — I509 Heart failure, unspecified: Secondary | ICD-10-CM | POA: Insufficient documentation

## 2017-09-10 DIAGNOSIS — J449 Chronic obstructive pulmonary disease, unspecified: Secondary | ICD-10-CM | POA: Diagnosis not present

## 2017-09-10 DIAGNOSIS — Z01810 Encounter for preprocedural cardiovascular examination: Secondary | ICD-10-CM | POA: Insufficient documentation

## 2017-09-10 LAB — NM MYOCAR MULTI W/SPECT W/WALL MOTION / EF
CHL CUP NUCLEAR SSS: 1
CHL CUP RESTING HR STRESS: 89 {beats}/min
CSEPEDS: 1 s
CSEPEW: 1 METS
CSEPHR: 68 %
Exercise duration (min): 1 min
LV dias vol: 72 mL (ref 46–106)
LV sys vol: 35 mL
MPHR: 144 {beats}/min
NUC STRESS TID: 1.26
Peak HR: 99 {beats}/min
SDS: 2
SRS: 1

## 2017-09-10 MED ORDER — REGADENOSON 0.4 MG/5ML IV SOLN
0.4000 mg | Freq: Once | INTRAVENOUS | Status: AC
  Administered 2017-09-10: 0.4 mg via INTRAVENOUS
  Filled 2017-09-10: qty 5

## 2017-09-10 MED ORDER — TECHNETIUM TC 99M TETROFOSMIN IV KIT
32.5750 | PACK | Freq: Once | INTRAVENOUS | Status: AC | PRN
  Administered 2017-09-10: 32.575 via INTRAVENOUS

## 2017-09-10 MED ORDER — TECHNETIUM TC 99M TETROFOSMIN IV KIT
13.0200 | PACK | Freq: Once | INTRAVENOUS | Status: AC | PRN
  Administered 2017-09-10: 13.02 via INTRAVENOUS

## 2017-09-10 NOTE — Progress Notes (Signed)
*  PRELIMINARY RESULTS* Echocardiogram 2D Echocardiogram has been performed.  Melissa Mcdonald 09/10/2017, 11:07 AM

## 2017-09-19 ENCOUNTER — Encounter: Payer: Self-pay | Admitting: Hematology and Oncology

## 2017-10-04 ENCOUNTER — Encounter: Payer: Self-pay | Admitting: Hematology and Oncology

## 2017-10-05 ENCOUNTER — Telehealth: Payer: Self-pay | Admitting: *Deleted

## 2017-10-05 NOTE — Telephone Encounter (Signed)
Returned call to patient's daughter [name]). She does wish to cancel. She states, "if there is nothing new we will just wait". I advised her that we were planning to review her [facility] visit and need for repeat biopsy when for recurrent palpable adenopathy. Advised that Bx results thus far have been inconclusive. The prednisone that the patient was prescribed partially treated her condition. Suspect Dx of low grade lymphoma, however repeat microscopic tissue review is warranted.   Appointment to be cancelled at this time. Daughter to call with concerns, and with any increases in palpable adenopathy. She was appreciative of communication from her [place] oncology team.   Honor Loh, MSN, APRN, FNP-C, CEN Oncology/Hematology Nurse Practitioner  [facility] 10/05/17, 1:51 PM

## 2017-10-05 NOTE — Telephone Encounter (Signed)
Patient does not want to keep appointment for Monday, she does not want to come back here unless she has cancer and at [facility] appointment they told her she does not have cancer. She would like a return call from Dr Mike Gip team. [phone]

## 2017-10-08 ENCOUNTER — Inpatient Hospital Stay: Payer: Medicare Other | Admitting: Hematology and Oncology

## 2018-02-21 ENCOUNTER — Other Ambulatory Visit: Payer: Self-pay | Admitting: Family Medicine

## 2018-02-21 DIAGNOSIS — C50919 Malignant neoplasm of unspecified site of unspecified female breast: Secondary | ICD-10-CM

## 2018-03-01 ENCOUNTER — Other Ambulatory Visit: Payer: Self-pay

## 2018-03-01 DIAGNOSIS — C50412 Malignant neoplasm of upper-outer quadrant of left female breast: Secondary | ICD-10-CM

## 2018-03-01 DIAGNOSIS — Z170 Estrogen receptor positive status [ER+]: Principal | ICD-10-CM

## 2018-03-14 ENCOUNTER — Ambulatory Visit: Payer: Self-pay

## 2018-03-14 ENCOUNTER — Ambulatory Visit (INDEPENDENT_AMBULATORY_CARE_PROVIDER_SITE_OTHER): Payer: Medicare Other | Admitting: General Surgery

## 2018-03-14 ENCOUNTER — Encounter: Payer: Self-pay | Admitting: General Surgery

## 2018-03-14 VITALS — BP 126/74 | HR 70 | Resp 14 | Ht 60.0 in | Wt 216.0 lb

## 2018-03-14 DIAGNOSIS — C50412 Malignant neoplasm of upper-outer quadrant of left female breast: Secondary | ICD-10-CM

## 2018-03-14 DIAGNOSIS — Z170 Estrogen receptor positive status [ER+]: Secondary | ICD-10-CM
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
## 2018-03-14 NOTE — Patient Instructions (Signed)
Have your mammogram as scheduled on 04/05/18. Call the next day for results.   Continue self breast exams. Call office for any new breast issues or concerns.  Follow up in one year with Bilateral Diagnostic Mammogram in one year

## 2018-04-05 ENCOUNTER — Ambulatory Visit
Admission: RE | Admit: 2018-04-05 | Discharge: 2018-04-05 | Disposition: A | Discharge status: Home / Self Care | Payer: Medicare Other | Source: Ambulatory Visit | Attending: General Surgery | Admitting: General Surgery

## 2018-04-05 DIAGNOSIS — C50412 Malignant neoplasm of upper-outer quadrant of left female breast: Secondary | ICD-10-CM | POA: Diagnosis present

## 2018-04-05 DIAGNOSIS — Z170 Estrogen receptor positive status [ER+]: Principal | ICD-10-CM

## 2018-07-26 ENCOUNTER — Non-Acute Institutional Stay: Payer: Medicare Other | Admitting: Nurse Practitioner

## 2018-07-26 ENCOUNTER — Encounter: Payer: Self-pay | Admitting: Nurse Practitioner

## 2018-07-26 VITALS — BP 126/72 | HR 72 | Temp 97.8°F | Resp 18 | Ht 64.0 in | Wt 214.7 lb

## 2018-07-26 DIAGNOSIS — Z515 Encounter for palliative care: Secondary | ICD-10-CM

## 2018-07-26 DIAGNOSIS — R600 Localized edema: Secondary | ICD-10-CM | POA: Insufficient documentation

## 2018-07-26 DIAGNOSIS — R0602 Shortness of breath: Secondary | ICD-10-CM
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
## 2018-11-13 ENCOUNTER — Non-Acute Institutional Stay: Payer: Medicare Other | Admitting: Nurse Practitioner

## 2018-11-13 ENCOUNTER — Encounter: Payer: Self-pay | Admitting: Nurse Practitioner

## 2018-11-13 VITALS — BP 128/76 | HR 76 | Temp 98.0°F | Resp 18 | Ht 63.0 in | Wt 225.0 lb

## 2018-11-13 DIAGNOSIS — R600 Localized edema: Secondary | ICD-10-CM

## 2018-11-13 DIAGNOSIS — R0602 Shortness of breath: Secondary | ICD-10-CM

## 2018-11-13 DIAGNOSIS — Z515 Encounter for palliative care: Secondary | ICD-10-CM
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
## 2018-11-14 ENCOUNTER — Other Ambulatory Visit: Payer: Self-pay

## 2018-12-02 IMAGING — MR MR HEAD WO/W CM
13 series · 48 of 48 positions shown · IV contrast (multihance)
Comparison: Head CT from 5 days ago

CLINICAL DATA: History of breast cancer.  The left temporal mass

EXAM:
MRI HEAD WITHOUT AND WITH CONTRAST
TECHNIQUE: Multiplanar, multiecho pulse sequences of the brain and surrounding
structures were obtained without and with intravenous contrast.
CONTRAST:  20mL MULTIHANCE GADOBENATE DIMEGLUMINE 529 MG/ML IV SOLN

[Series 3: T1 · sagittal · 5.0mm · 0.45mm/px · 3 of 32 slices shown (1 of 2)]
[im 1/32]
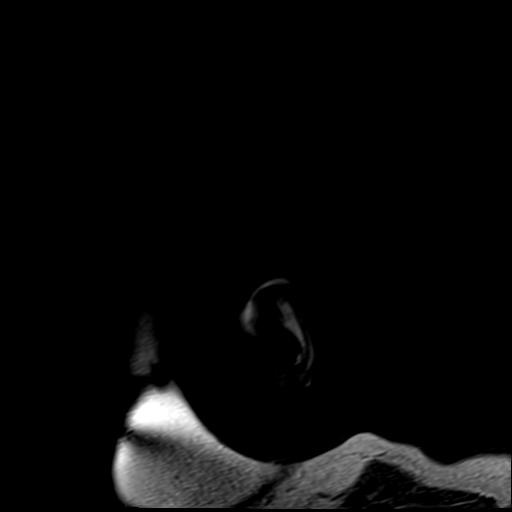
[im 16/32]
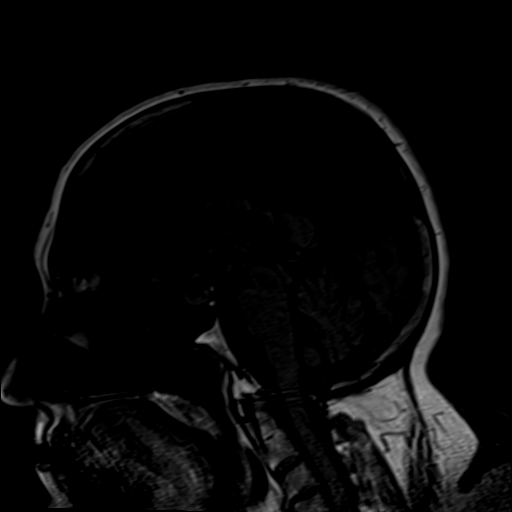
[im 32/32]
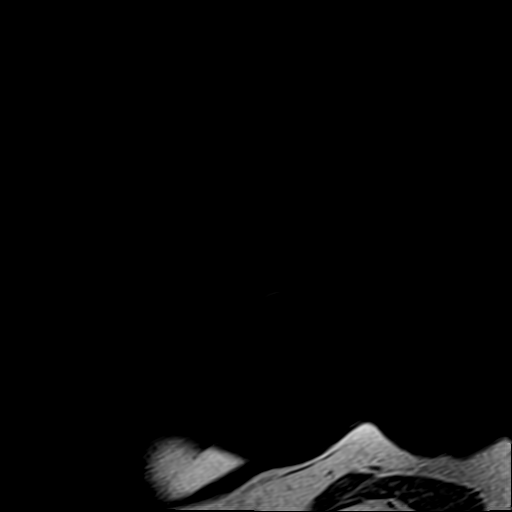

[Series 5: DWI · axial · 3.0mm · 1.80mm/px · z∈[-67,+86]mm · 5 of 52 slices shown (1 of 2)]
[im 1/52]
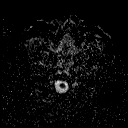
[im 13/52]
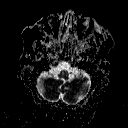
[im 26/52]
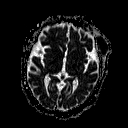
[im 39/52]
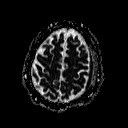
[im 52/52]
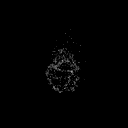

[Series 7: DWI · coronal · 3.0mm · 1.80mm/px · 5 of 49 slices shown (2 of 2)]
[im 1/49]
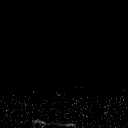
[im 13/49]
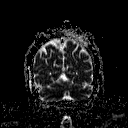
[im 25/49]
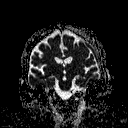
[im 37/49]
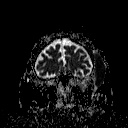
[im 49/49]
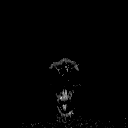

[Series 8: T2 · axial · 5.0mm · 0.90mm/px · z∈[-66,+89]mm · 2 of 25 slices shown (1 of 2)]
[im 1/25]
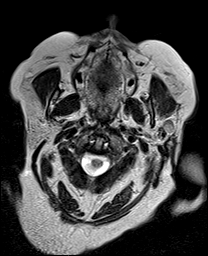
[im 25/25]
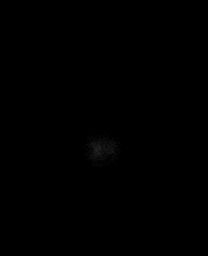

[Series 9: FLAIR · axial · 5.0mm · 0.45mm/px · z∈[-66,+89]mm · 2 of 25 slices shown]
[im 1/25]
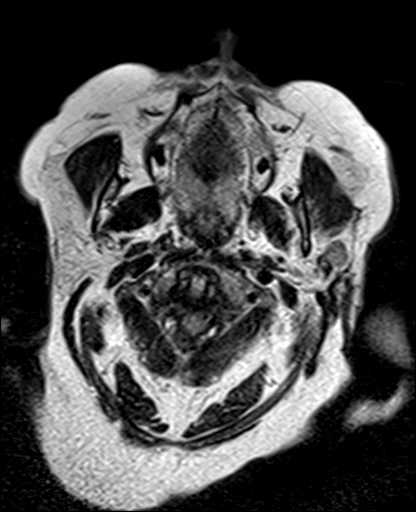
[im 25/25]
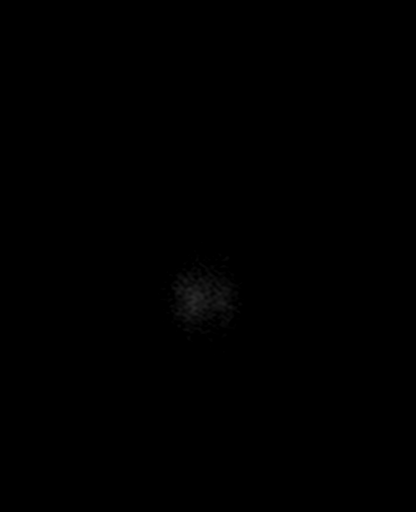

[Series 10: T2 · axial · 5.0mm · 0.45mm/px · z∈[-66,+89]mm · 2 of 25 slices shown (2 of 2)]
[im 1/25]
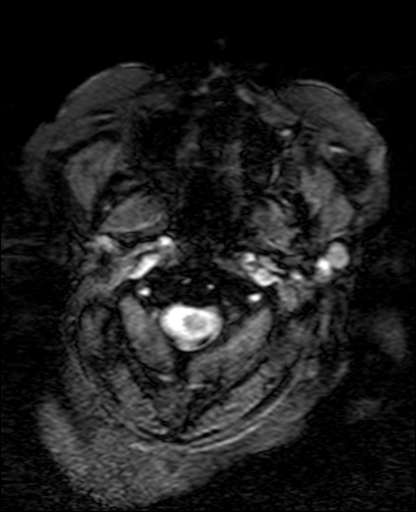
[im 25/25]
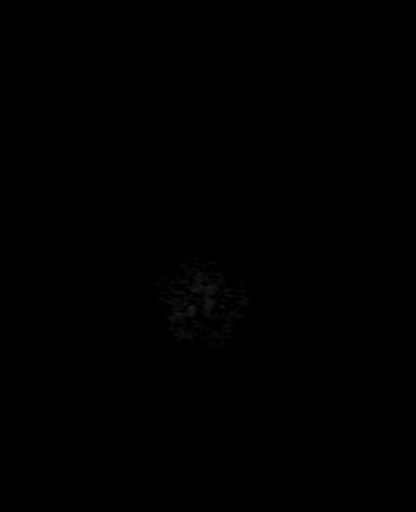

[Series 11: T1 · axial · 3.0mm · 1.00mm/px · z∈[-64,+88]mm · 5 of 52 slices shown (2 of 2)]
[im 1/52]
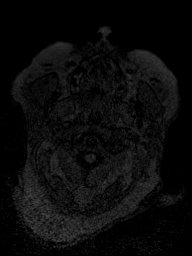
[im 13/52]
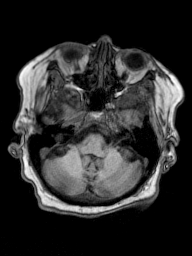
[im 26/52]
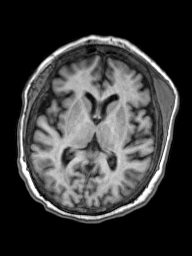
[im 39/52]
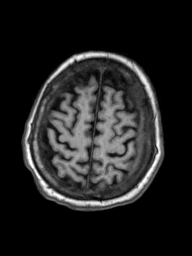
[im 52/52]
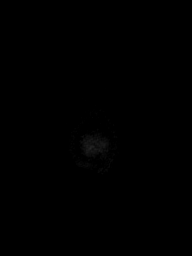

[Series 12: T2 post-contrast · coronal · 5.0mm · 0.86mm/px · 3 of 29 slices shown]
[im 1/29]
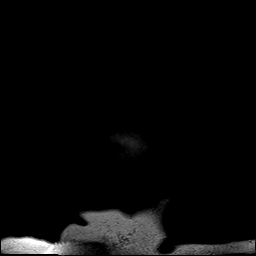
[im 15/29]
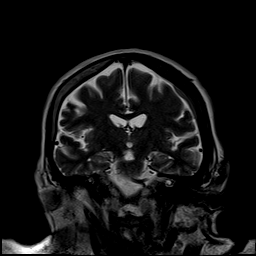
[im 29/29]
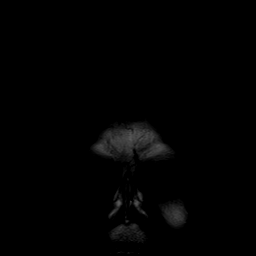

[Series 13: T1 post-contrast · axial · 3.0mm · 1.00mm/px · z∈[-64,+88]mm · 5 of 52 slices shown (1 of 3)]
[im 1/52]
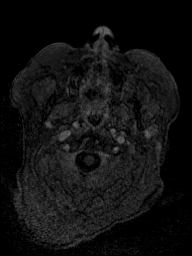
[im 13/52]
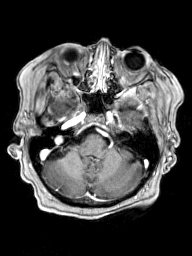
[im 26/52]
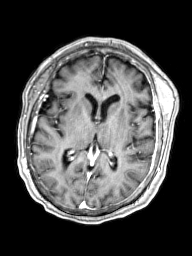
[im 39/52]
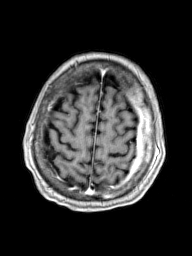
[im 52/52]
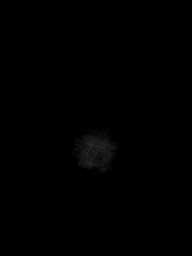

[Series 14: T1 post-contrast · coronal · 5.0mm · 0.43mm/px · 3 of 29 slices shown (2 of 3)]
[im 1/29]
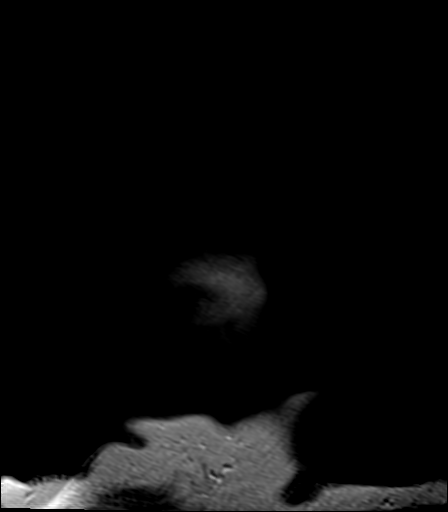
[im 15/29]
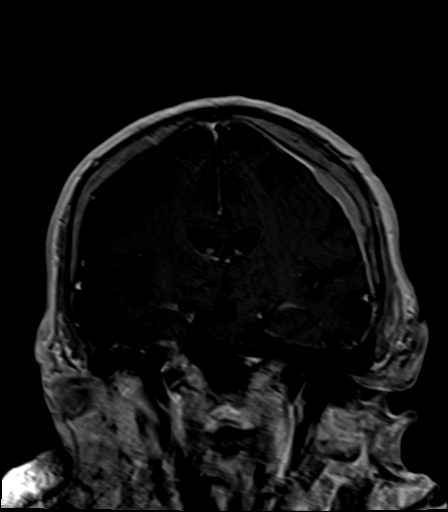
[im 29/29]
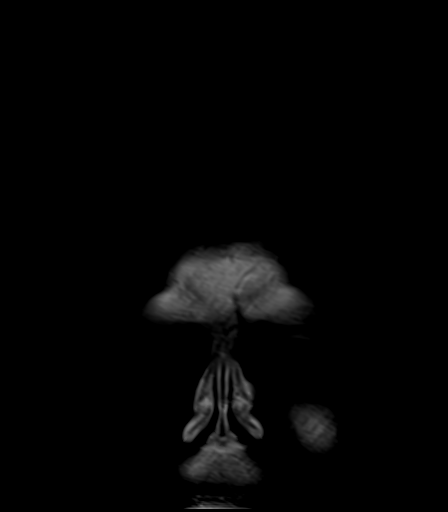

[Series 15: T1 post-contrast · sagittal · 5.0mm · 0.45mm/px · 3 of 32 slices shown (3 of 3)]
[im 1/32]
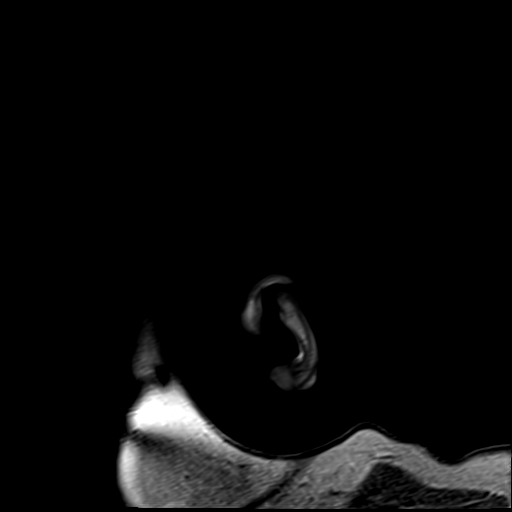
[im 16/32]
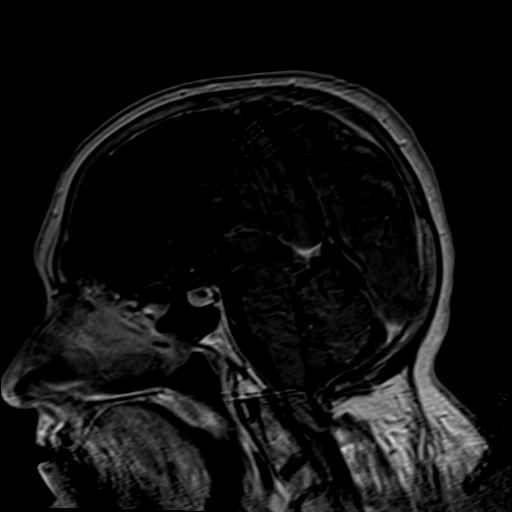
[im 32/32]
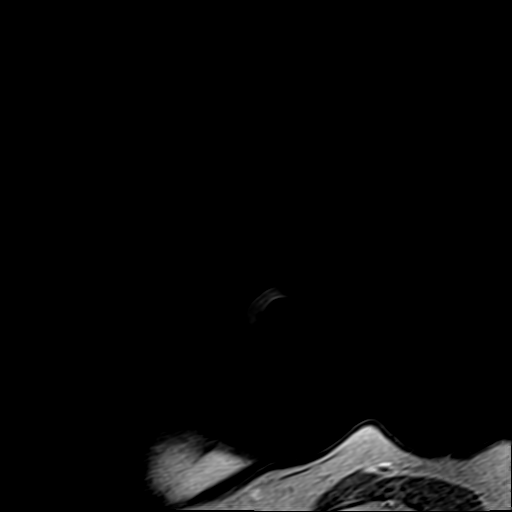

[Series 100: ax (id) · axial · 3.0mm · 1.80mm/px · z∈[-67,+86]mm · 5 of 52 slices shown]
[im 1/52]
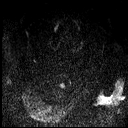
[im 13/52]
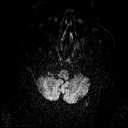
[im 26/52]
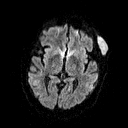
[im 39/52]
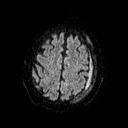
[im 52/52]
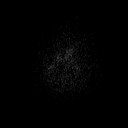

[Series 101: cor (id) · coronal · 3.0mm · 1.80mm/px · 5 of 49 slices shown]
[im 1/49]
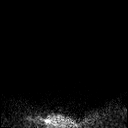
[im 13/49]
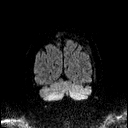
[im 25/49]
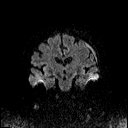
[im 37/49]
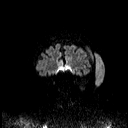
[im 49/49]
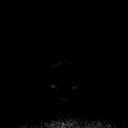

[48 of 48 positions shown; findings below may reference images not displayed]

FINDINGS: Brain: No evidence of parenchymal brain metastasis. No incidental
infarct, hemorrhage, hydrocephalus, or swelling. There is dural
thickening noted below.

Vascular: Major flow voids and vascular enhancements are preserved.

Skull and upper cervical spine: Extensive, patchy marrow replacement
in the left calvarium that is most likely metastatic disease. There
replacement on diffusion imaging reaches the superior sagittal
suture at the vertex. There is likely also left greater wing
sphenoid involvement. There is contiguous discrete masses in the
left temporalis muscle measuring up to 4 x 2 cm on axial slices. The
dura around the left cerebral convexity is thickened at 8 mm in
thickness, also presumably tumoral.

Sinuses/Orbits: No noted metastatic disease.

Other: There is a partially visualized mass in the left parotid that
is known from PET-CT 11/03/2015. The visualized portion today
measures 14 mm.
IMPRESSION: 1. Findings of left calvarial metastatic disease with dural and left
temporalis invasion as described.
2. Negative for brain metastasis.
3. Partially visualized left parotid nodule measuring 14 mm, known
from PET-CT 11/03/2015.

## 2018-12-24 ENCOUNTER — Encounter: Payer: Self-pay | Admitting: General Surgery

## 2019-01-14 IMAGING — CT CT BIOPSY
2 series · 10 of 14 positions shown, 12 images · non-contrast
Comparison: none

INDICATION: 76-year-old with recently diagnosed B-cell lymphoma. Previous
cervical lymph node biopsy. Patient presents for bone marrow biopsy.

[Series 2: i-spiral 5.0 b30f · axial · 0.68mm/px · z∈[+966,+1032]mm · 7 of 27 slices shown, 9 images]
[im 4/27  soft-tissue]
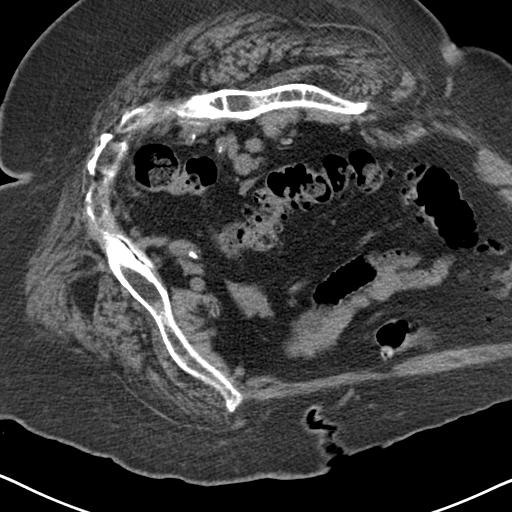
[im 4/27  bone]
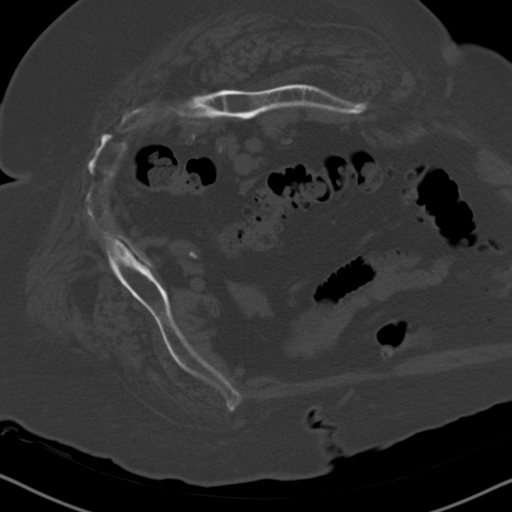
[im 7/27  bone]
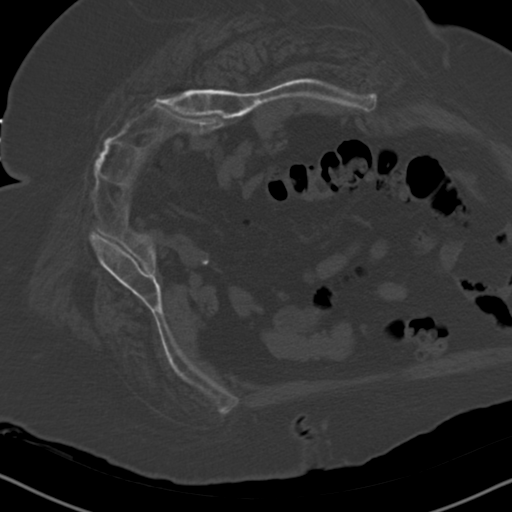
[im 10/27  bone]
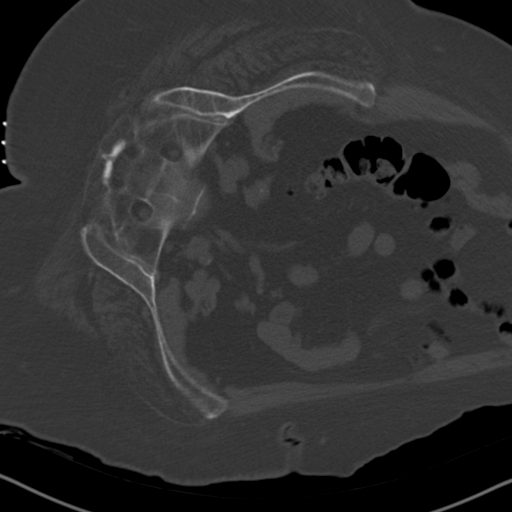
[im 14/27  bone]
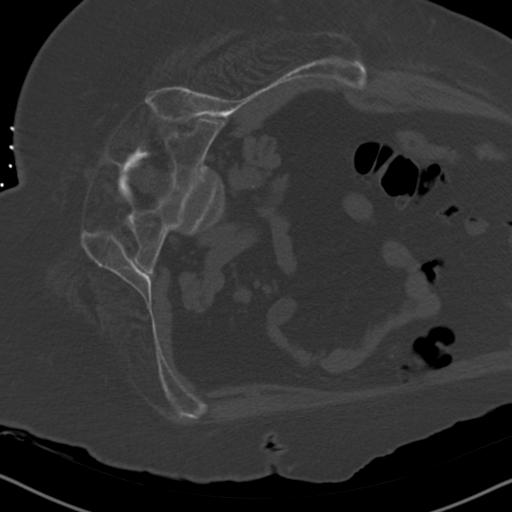
[im 17/27  soft-tissue]
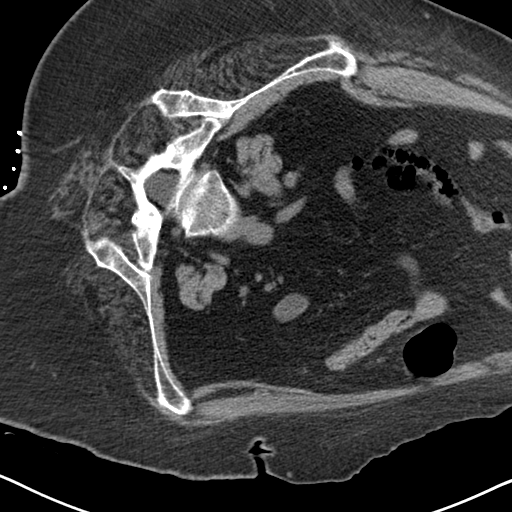
[im 17/27  bone]
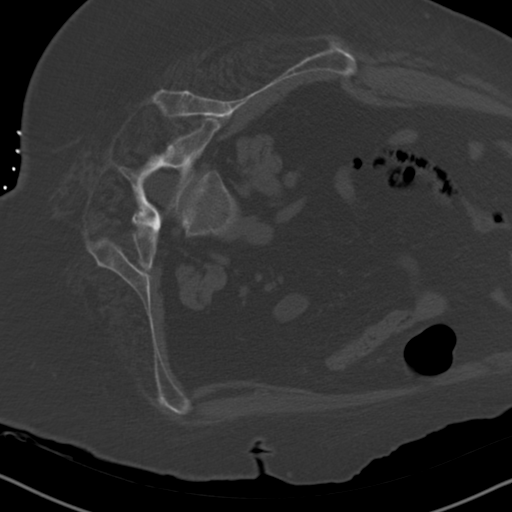
[im 20/27  bone]
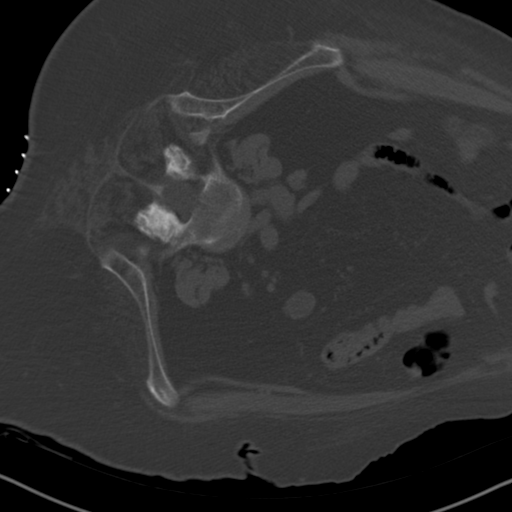
[im 23/27  bone]
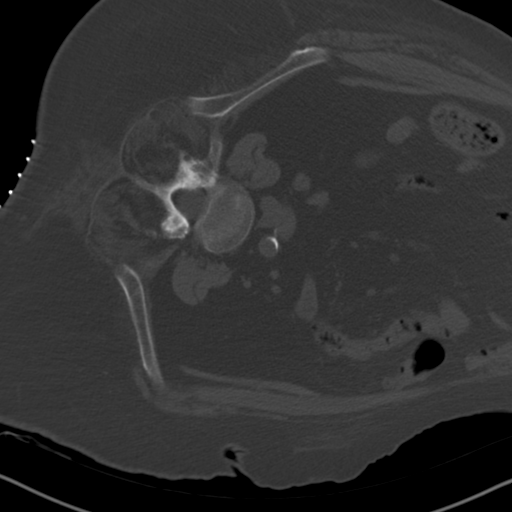

[Series 3: i-sequence 4.8 b30s · axial · 0.68mm/px · z∈[+992,+1002]mm · 3 of 15 slices shown]
[im 4/15  bone]
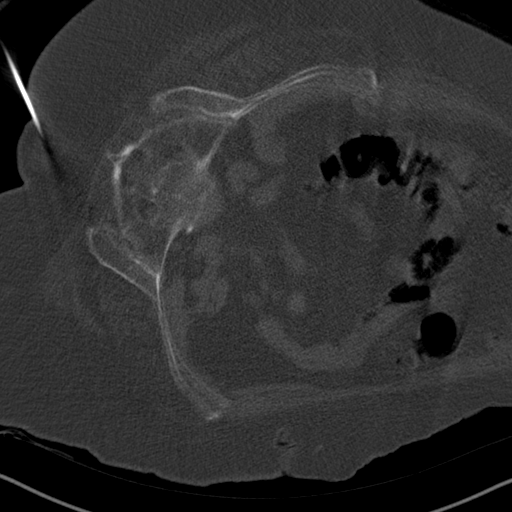
[im 8/15  bone]
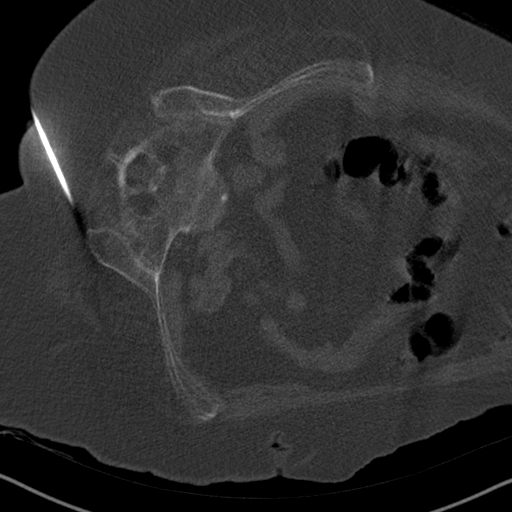
[im 11/15  bone]
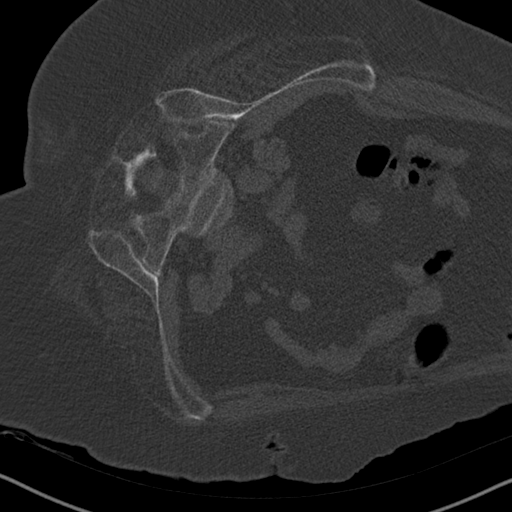

[10 of 14 positions shown; findings below may reference images not displayed]

EXAM:
CT GUIDED BONE MARROW ASPIRATES AND BIOPSY

MEDICATIONS:
None.

ANESTHESIA/SEDATION:
Fentanyl 2.0 mcg IV; Versed 100 mg IV

Moderate Sedation Time:  28 minutes

The patient was continuously monitored during the procedure by the
interventional radiology nurse under my direct supervision.

COMPLICATIONS:
None immediate.

PROCEDURE:
The procedure was explained to the patient. The risks and benefits
of the procedure were discussed and the patient's questions were
addressed. Informed consent was obtained from the patient. Patient
was placed on her left side. Images of the pelvis were obtained. The
back was prepped with chlorhexidine and a sterile field was created.
The skin and left posterior ilium were anesthetized with 1%
lidocaine. 11 gauge bone needle was directed into the left ilium
with CT guidance. Two aspirates and 2 core biopsies were performed.
One adequate core biopsy was obtained. Bandage placed over the
puncture site.
IMPRESSION: CT guided bone marrow aspiration and core biopsy.

## 2019-01-20 IMAGING — CR DG CHEST 2V
1 series · 2 of 2 positions shown · non-contrast
Comparison: PET-CT November 03, 2015

CLINICAL DATA: Cough and shortness of breath. History of left
breast carcinoma

EXAM:
CHEST  2 VIEW

[Series 1: dg chest 2 view · 0.14mm/px · 2 of 2 slices shown]
[im 1/2]
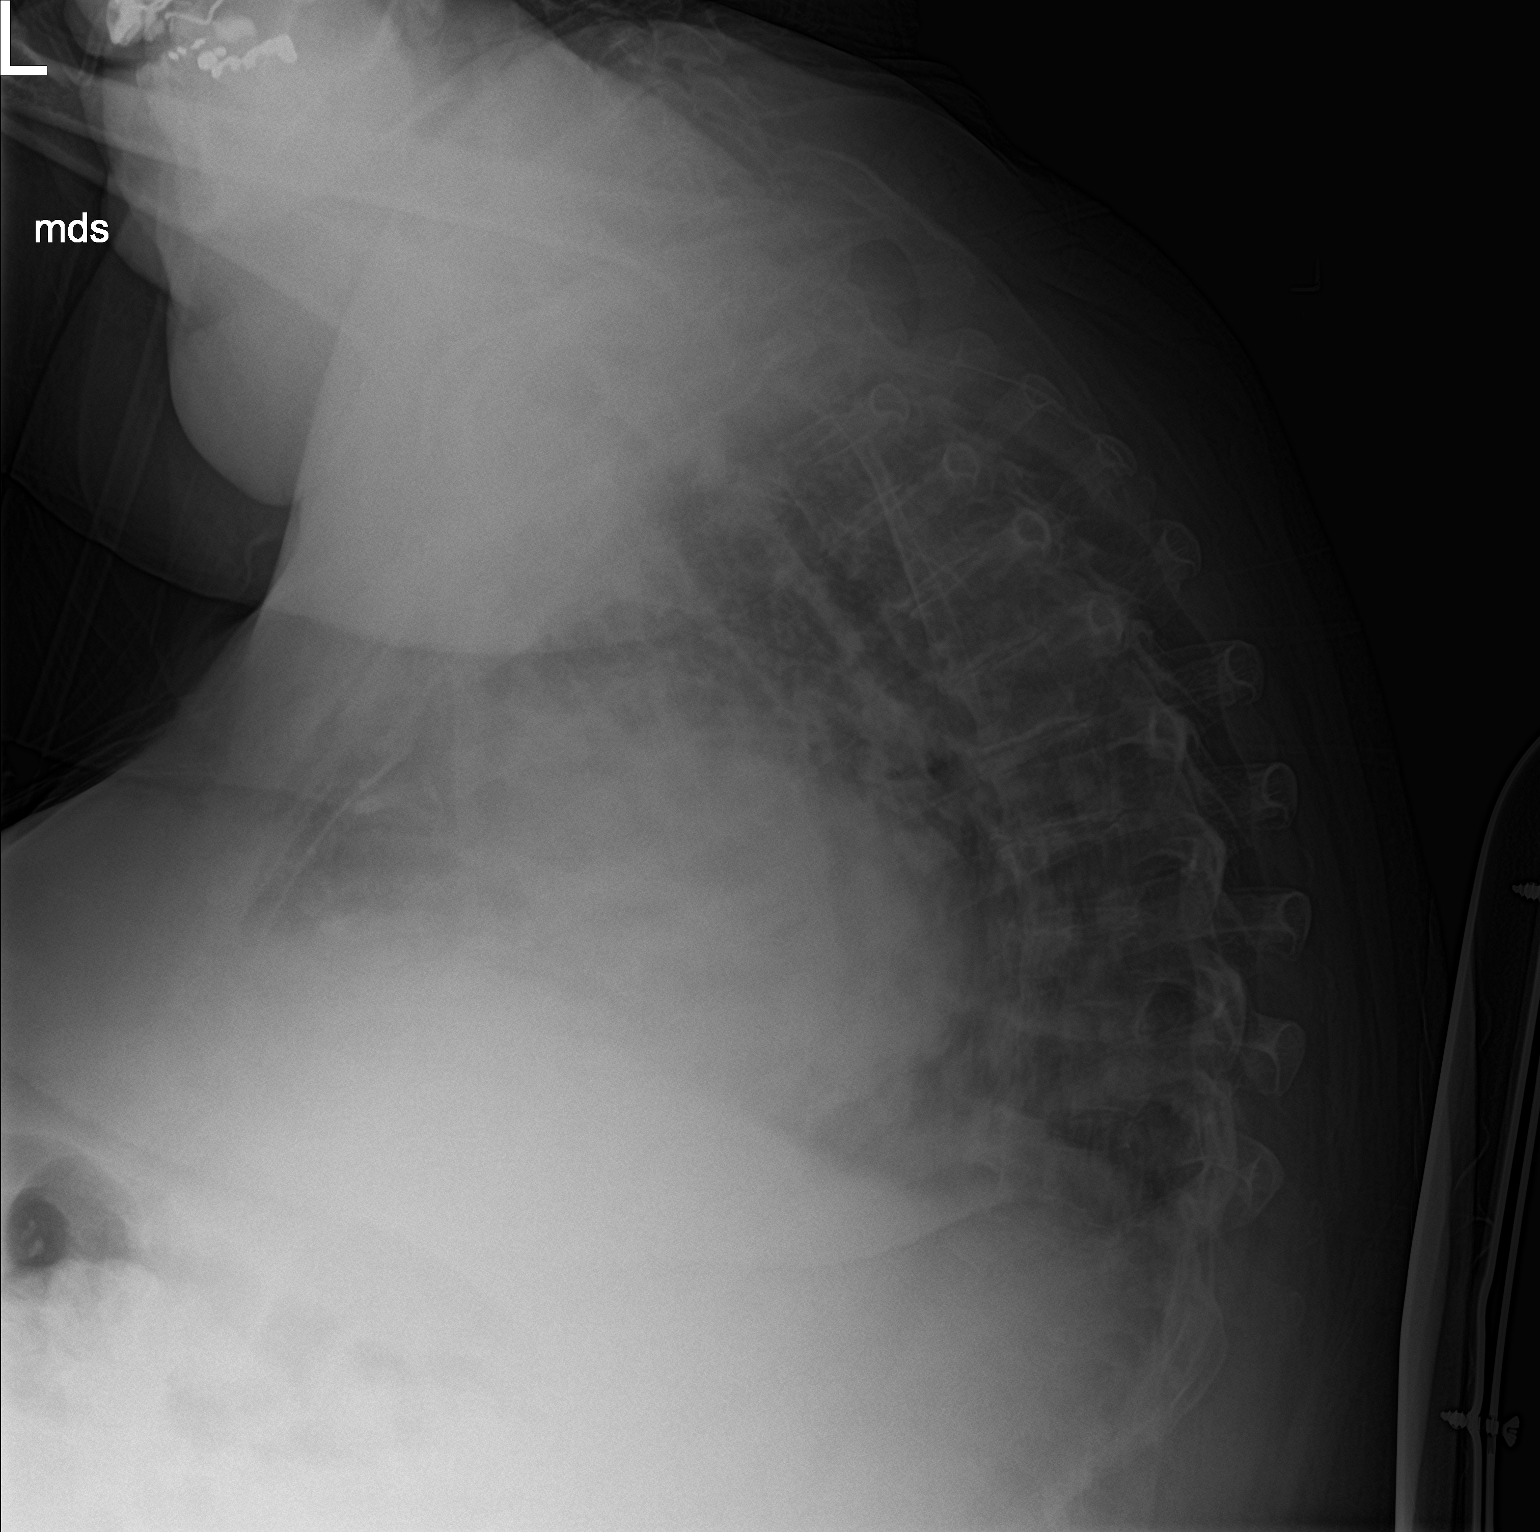
[im 2/2]
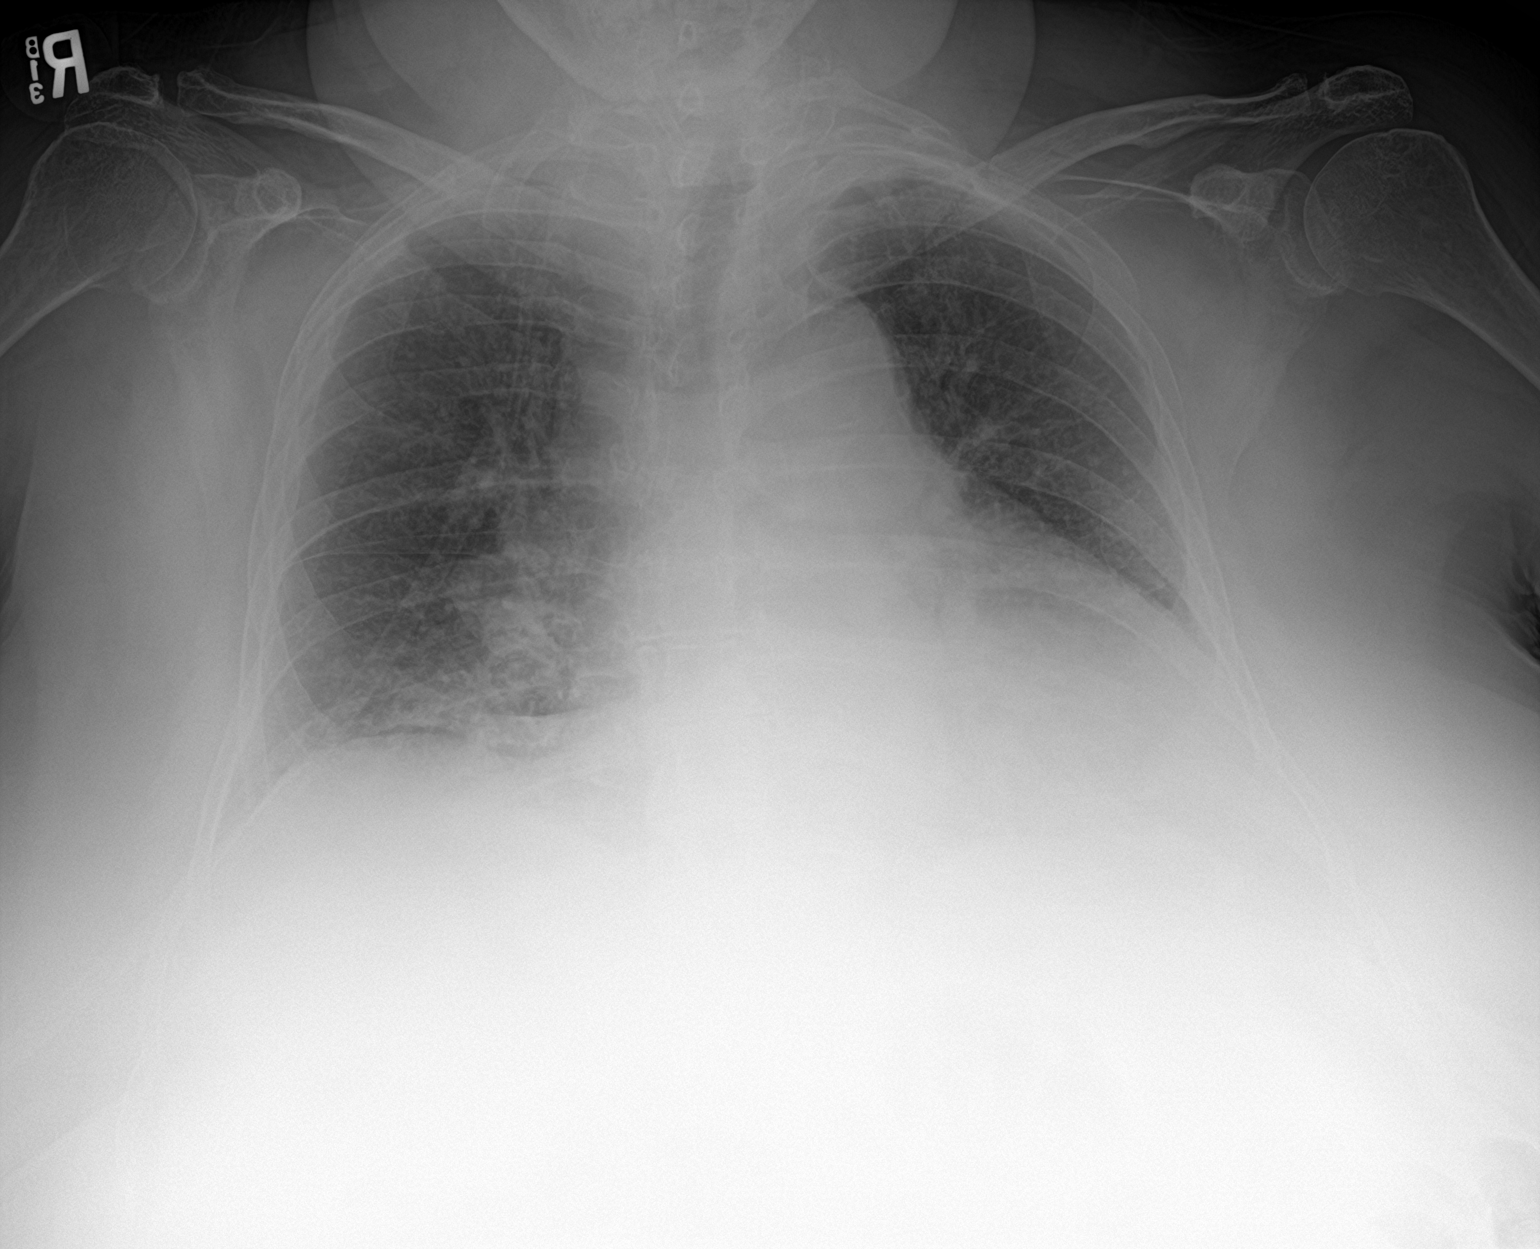

[2 of 2 positions shown; findings below may reference images not displayed]

FINDINGS: There is no edema or consolidation. Heart is enlarged with pulmonary
vascularity within normal limits. No adenopathy. There is
degenerative change in the thoracic spine.
IMPRESSION: Cardiac enlargement. No edema or consolidation. No evident
adenopathy.

## 2019-02-12 ENCOUNTER — Telehealth: Payer: Self-pay

## 2019-02-12 NOTE — Telephone Encounter (Signed)
error 

## 2019-02-21 ENCOUNTER — Other Ambulatory Visit: Payer: Self-pay

## 2019-02-21 DIAGNOSIS — Z1231 Encounter for screening mammogram for malignant neoplasm of breast: Secondary | ICD-10-CM

## 2019-03-05 ENCOUNTER — Other Ambulatory Visit: Payer: Self-pay

## 2019-03-05 ENCOUNTER — Non-Acute Institutional Stay: Payer: Medicare Other | Admitting: Adult Health Nurse Practitioner

## 2019-03-05 DIAGNOSIS — Z515 Encounter for palliative care: Secondary | ICD-10-CM
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
## 2019-04-04 ENCOUNTER — Non-Acute Institutional Stay: Payer: Medicare Other | Admitting: Nurse Practitioner

## 2019-04-04 ENCOUNTER — Encounter: Payer: Self-pay | Admitting: Nurse Practitioner

## 2019-04-04 DIAGNOSIS — Z515 Encounter for palliative care: Secondary | ICD-10-CM
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
## 2019-04-07 ENCOUNTER — Other Ambulatory Visit: Payer: Self-pay

## 2019-04-11 ENCOUNTER — Encounter: Payer: Self-pay | Admitting: Nurse Practitioner

## 2019-04-11 ENCOUNTER — Non-Acute Institutional Stay: Payer: Medicare Other | Admitting: Nurse Practitioner

## 2019-04-11 DIAGNOSIS — Z515 Encounter for palliative care: Secondary | ICD-10-CM
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
## 2019-04-14 ENCOUNTER — Other Ambulatory Visit: Payer: Self-pay

## 2019-04-15 ENCOUNTER — Ambulatory Visit: Payer: Medicare Other | Admitting: Surgery

## 2019-05-21 ENCOUNTER — Non-Acute Institutional Stay: Payer: Medicare Other | Admitting: Nurse Practitioner

## 2019-05-21 ENCOUNTER — Encounter: Payer: Self-pay | Admitting: Nurse Practitioner

## 2019-05-21 VITALS — BP 124/64 | HR 78 | Temp 97.7°F | Resp 18 | Wt 199.5 lb

## 2019-05-21 DIAGNOSIS — Z515 Encounter for palliative care: Secondary | ICD-10-CM
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
## 2019-05-22 ENCOUNTER — Other Ambulatory Visit: Payer: Self-pay

## 2019-07-01 ENCOUNTER — Ambulatory Visit
Admission: RE | Admit: 2019-07-01 | Discharge: 2019-07-01 | Disposition: A | Discharge status: Home / Self Care | Payer: Medicare Other | Source: Ambulatory Visit | Attending: Surgery | Admitting: Surgery

## 2019-07-01 DIAGNOSIS — Z1231 Encounter for screening mammogram for malignant neoplasm of breast: Secondary | ICD-10-CM | POA: Diagnosis present

## 2019-07-07 ENCOUNTER — Ambulatory Visit: Payer: Medicare Other | Admitting: Surgery

## 2019-07-09 ENCOUNTER — Ambulatory Visit: Payer: Medicare Other | Admitting: Surgery

## 2019-07-16 ENCOUNTER — Telehealth: Payer: Self-pay | Admitting: Emergency Medicine

## 2019-07-16 ENCOUNTER — Other Ambulatory Visit: Payer: Self-pay

## 2019-07-16 ENCOUNTER — Encounter: Payer: Self-pay | Admitting: Surgery

## 2019-07-16 ENCOUNTER — Ambulatory Visit (INDEPENDENT_AMBULATORY_CARE_PROVIDER_SITE_OTHER): Payer: Medicare Other | Admitting: Surgery

## 2019-07-16 VITALS — BP 165/72 | HR 74 | Temp 97.7°F | Resp 14

## 2019-07-16 DIAGNOSIS — C50412 Malignant neoplasm of upper-outer quadrant of left female breast: Secondary | ICD-10-CM | POA: Diagnosis not present

## 2019-07-16 DIAGNOSIS — G629 Polyneuropathy, unspecified: Secondary | ICD-10-CM | POA: Insufficient documentation

## 2019-07-16 DIAGNOSIS — Z170 Estrogen receptor positive status [ER+]: Secondary | ICD-10-CM

## 2019-07-16 DIAGNOSIS — K5790 Diverticulosis of intestine, part unspecified, without perforation or abscess without bleeding: Secondary | ICD-10-CM | POA: Insufficient documentation

## 2019-07-16 NOTE — Patient Instructions (Signed)
Our office will get in touch with you around January 2022 to schedule your mammogram and follow up visit in February 2022.  Please call the office if you have any questions or concerns.   Breast Self-Awareness Breast self-awareness means being familiar with how your breasts look and feel. It involves checking your breasts regularly and reporting any changes to your health care provider. Practicing breast self-awareness is important. Sometimes changes may not be harmful (are benign), but sometimes a change in your breasts can be a sign of a serious medical problem. It is important to learn how to do this procedure correctly so that you can catch problems early, when treatment is more likely to be successful. All women should practice breast self-awareness, including women who have had breast implants. What you need:  A mirror.  A well-lit room. How to do a breast self-exam A breast self-exam is one way to learn what is normal for your breasts and whether your breasts are changing. To do a breast self-exam: Look for changes  1. Remove all the clothing above your waist. 2. Stand in front of a mirror in a room with good lighting. 3. Put your hands on your hips. 4. Push your hands firmly downward. 5. Compare your breasts in the mirror. Look for differences between them (asymmetry), such as: ? Differences in shape. ? Differences in size. ? Puckers, dips, and bumps in one breast and not the other. 6. Look at each breast for changes in the skin, such as: ? Redness. ? Scaly areas. 7. Look for changes in your nipples, such as: ? Discharge. ? Bleeding. ? Dimpling. ? Redness. ? A change in position. Feel for changes Carefully feel your breasts for lumps and changes. It is best to do this while lying on your back on the floor, and again while sitting or standing in the tub or shower with soapy water on your skin. Feel each breast in the following way: 1. Place the arm on the side of the breast  you are examining above your head. 2. Feel your breast with the other hand. 3. Start in the nipple area and make -inch (2 cm) overlapping circles to feel your breast. Use the pads of your three middle fingers to do this. Apply light pressure, then medium pressure, then firm pressure. The light pressure will allow you to feel the tissue closest to the skin. The medium pressure will allow you to feel the tissue that is a little deeper. The firm pressure will allow you to feel the tissue close to the ribs. 4. Continue the overlapping circles, moving downward over the breast until you feel your ribs below your breast. 5. Move one finger-width toward the center of the body. Continue to use the -inch (2 cm) overlapping circles to feel your breast as you move slowly up toward your collarbone. 6. Continue the up-and-down exam using all three pressures until you reach your armpit.  Write down what you find Writing down what you find can help you remember what to discuss with your health care provider. Write down:  What is normal for each breast.  Any changes that you find in each breast, including: ? The kind of changes you find. ? Any pain or tenderness. ? Size and location of any lumps.  Where you are in your menstrual cycle, if you are still menstruating. General tips and recommendations  Examine your breasts every month.  If you are breastfeeding, the best time to examine your breasts is  after a feeding or after using a breast pump.  If you menstruate, the best time to examine your breasts is 5-7 days after your period. Breasts are generally lumpier during menstrual periods, and it may be more difficult to notice changes.  With time and practice, you will become more familiar with the variations in your breasts and more comfortable with the exam. Contact a health care provider if you:  See a change in the shape or size of your breasts or nipples.  See a change in the skin of your breast  or nipples, such as a reddened or scaly area.  Have unusual discharge from your nipples.  Find a lump or thick area that was not there before.  Have pain in your breasts.  Have any concerns related to your breast health. Summary  Breast self-awareness includes looking for physical changes in your breasts, as well as feeling for any changes within your breasts.  Breast self-awareness should be performed in front of a mirror in a well-lit room.  You should examine your breasts every month. If you menstruate, the best time to examine your breasts is 5-7 days after your menstrual period.  Let your health care provider know of any changes you notice in your breasts, including changes in size, changes on the skin, pain or tenderness, or unusual fluid from your nipples. This information is not intended to replace advice given to you by your health care provider. Make sure you discuss any questions you have with your health care provider. Document Revised: 01/08/2018 Document Reviewed: 01/08/2018 Elsevier Patient Education  [place].

## 2019-07-16 NOTE — Telephone Encounter (Signed)
error 
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
## 2019-12-05 ENCOUNTER — Encounter: Payer: Self-pay | Admitting: Nurse Practitioner

## 2019-12-05 ENCOUNTER — Non-Acute Institutional Stay: Payer: Medicare Other | Admitting: Nurse Practitioner

## 2019-12-05 VITALS — BP 115/68 | Wt 187.6 lb

## 2019-12-05 DIAGNOSIS — Z515 Encounter for palliative care: Secondary | ICD-10-CM

## 2019-12-05 DIAGNOSIS — R600 Localized edema: Secondary | ICD-10-CM
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
## 2019-12-08 ENCOUNTER — Other Ambulatory Visit: Payer: Self-pay

## 2020-03-04 ENCOUNTER — Encounter: Payer: Self-pay | Admitting: Nurse Practitioner

## 2020-03-04 ENCOUNTER — Non-Acute Institutional Stay: Payer: Medicare Other | Admitting: Nurse Practitioner

## 2020-03-04 VITALS — BP 115/68 | Wt 216.0 lb

## 2020-03-04 DIAGNOSIS — I272 Pulmonary hypertension, unspecified: Secondary | ICD-10-CM

## 2020-03-04 DIAGNOSIS — Z515 Encounter for palliative care: Secondary | ICD-10-CM
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
## 2020-03-05 ENCOUNTER — Other Ambulatory Visit: Payer: Self-pay

## 2020-06-11 ENCOUNTER — Other Ambulatory Visit: Payer: Self-pay

## 2020-06-11 DIAGNOSIS — Z1231 Encounter for screening mammogram for malignant neoplasm of breast: Secondary | ICD-10-CM

## 2020-06-22 ENCOUNTER — Other Ambulatory Visit: Payer: Self-pay

## 2020-06-22 DIAGNOSIS — N6320 Unspecified lump in the left breast, unspecified quadrant: Secondary | ICD-10-CM

## 2020-07-01 ENCOUNTER — Other Ambulatory Visit: Payer: Self-pay

## 2020-07-01 ENCOUNTER — Ambulatory Visit
Admission: RE | Admit: 2020-07-01 | Discharge: 2020-07-01 | Disposition: A | Discharge status: Home / Self Care | Payer: Medicare Other | Source: Ambulatory Visit | Attending: Surgery | Admitting: Surgery

## 2020-07-01 DIAGNOSIS — Z853 Personal history of malignant neoplasm of breast: Secondary | ICD-10-CM | POA: Diagnosis not present

## 2020-07-01 DIAGNOSIS — N6320 Unspecified lump in the left breast, unspecified quadrant: Secondary | ICD-10-CM

## 2020-07-01 DIAGNOSIS — N641 Fat necrosis of breast: Secondary | ICD-10-CM | POA: Insufficient documentation

## 2020-07-02 ENCOUNTER — Telehealth: Payer: Self-pay

## 2020-07-02 NOTE — Telephone Encounter (Signed)
Pt's daughter [name] notified of mammo results. Verbalizes understanding.

## 2020-07-05 ENCOUNTER — Other Ambulatory Visit: Payer: Self-pay

## 2020-07-05 ENCOUNTER — Ambulatory Visit (INDEPENDENT_AMBULATORY_CARE_PROVIDER_SITE_OTHER): Payer: Medicare Other | Admitting: Surgery

## 2020-07-05 ENCOUNTER — Encounter: Payer: Self-pay | Admitting: Surgery

## 2020-07-05 VITALS — BP 146/77 | HR 61 | Temp 98.3°F | Ht 63.0 in | Wt 224.0 lb

## 2020-07-05 DIAGNOSIS — Z853 Personal history of malignant neoplasm of breast: Secondary | ICD-10-CM

## 2020-07-05 DIAGNOSIS — N6320 Unspecified lump in the left breast, unspecified quadrant: Secondary | ICD-10-CM | POA: Diagnosis not present

## 2020-07-05 DIAGNOSIS — N644 Mastodynia: Secondary | ICD-10-CM

## 2020-07-05 NOTE — Patient Instructions (Signed)
We will reach out to you in December 2022 to schedule her mammogram for January 2023.
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
## 2020-08-02 ENCOUNTER — Non-Acute Institutional Stay: Payer: Medicare Other | Admitting: Nurse Practitioner

## 2020-08-02 ENCOUNTER — Encounter: Payer: Self-pay | Admitting: Nurse Practitioner

## 2020-08-02 DIAGNOSIS — I272 Pulmonary hypertension, unspecified: Secondary | ICD-10-CM

## 2020-08-02 DIAGNOSIS — Z515 Encounter for palliative care: Secondary | ICD-10-CM
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
## 2020-08-03 ENCOUNTER — Other Ambulatory Visit: Payer: Self-pay

## 2021-01-28 ENCOUNTER — Other Ambulatory Visit: Payer: Self-pay

## 2021-01-28 ENCOUNTER — Encounter: Payer: Self-pay | Admitting: Nurse Practitioner

## 2021-01-28 ENCOUNTER — Non-Acute Institutional Stay: Payer: Medicare Other | Admitting: Nurse Practitioner

## 2021-01-28 VITALS — BP 158/81 | HR 78 | Resp 18 | Wt 230.0 lb

## 2021-01-28 DIAGNOSIS — R600 Localized edema: Secondary | ICD-10-CM

## 2021-01-28 DIAGNOSIS — Z515 Encounter for palliative care: Secondary | ICD-10-CM

## 2021-01-28 DIAGNOSIS — R0602 Shortness of breath: Secondary | ICD-10-CM

## 2021-01-28 NOTE — Progress Notes (Signed)
[facility] Consult Note Telephone: [phone]  Fax: [phone]    Date of encounter: 01/28/21 7:49 PM PATIENT NAME: Melissa Mcdonald [address] [place] [address]   [phone] (home)  DOB: Aug 01, 1940 MRN: [id number] PRIMARY CARE PROVIDER:    Dr Lucianne Lei [facility]  RESPONSIBLE PARTY:    Contact Information     Name Relation Home Work Mobile   [place] Daughter   [phone]      I met face to face with patient in facility. Palliative Care was asked to follow this patient by consultation request of Dr Nona Dell  to address advance care planning and complex medical decision making. This is a follow up visit ASSESSMENT AND PLAN / RECOMMENDATIONS:  Symptom Management/Plan: 1. ACP: Medical goals to focus on DNR, do not intubate, no feeding tube, no surgical interventions but wishes are for antibiotics, blood transfusions, diagnostic testing, hospitalization, IV fluids, lab testing.    2. Edema/shortness of breath improved, secondary to positive covid pulmonary HTN, chf/COPD monitor weights, elevate   3. Palliative care encounter/ Palliative medicine team will continue to support patient, patient's family, and medical team. Visit consisted of counseling and education dealing with the complex and emotionally intense issues of symptom management and palliative care in the setting of serious and potentially life-threatening illness  Follow up Palliative Care Visit: Palliative care will continue to follow for complex medical decision making, advance care planning, and clarification of goals. Return 8 weeks or prn.  I spent 68 minutes providing this consultation. More than 50% of the time in this consultation was spent in counseling and care coordination.  PPS: 40%  Chief Complaint: Follow up palliative consult for complex medical decision making  HISTORY OF PRESENT ILLNESS:  Melissa Mcdonald is a 80 y.o. year  old female  with multiple medical problems including Pulmonary hypertension, diastolic congestive heart failure, COPD, chronic hypoxic respiratory failure, chronic kidney disease, follicular lymphoma nodes of head, face, neck, obstructive sleep apnea, morbid obesity, diabetes, gout, vitamin D deficiency, vitamin B12 deficiency, anemia, single non-toxic thyroid nodule, polyneuropathy, vascular disease, anemia, left breast mass with no concerning lesions. 01/14/2021 covid positive. 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 Anxiety prescribed buspar. Melissa Mcdonald receives psychotherapy.  Melissa Mcdonald continues to reside in [place] at [facility]. Melissa Mcdonald does require staff assistance for mobility though she is able to maneuver in her bed. Staff endorses Melissa Mcdonald previously was being hhoyered to a wheelchair though not recently.Melissa Mcdonald does require assistance with bathing, dressing, episodes of incontinence. Melissa Mcdonald does feed herself and appetite is good depending on what is being served. No recent hospitalizations, falls, wounds. Staff endorses no new changes or concerns. Melissa Mcdonald is able to verbalize her needs. Melissa Mcdonald has stayed with the same roommate for quite some time now and they get along well as they have moved to several different rooms together. At present Melissa Mcdonald is sitting in her bed working on her crafts. Melissa Mcdonald, debilitated but comfortable. No visitors present. I visited observed Melissa Mcdonald. We talked about purpose of palliative care visit. Melissa Mcdonald and agreement. We talked about how she is feeling today. Melissa Mcdonald endorses she is doing okay.  Melissa Mcdonald talked about recent COVID infection. Melissa Mcdonald endorses she was not even sure she had it. We talked about symptoms of pain and shortness of breath which she denies.  We talked about Melissa Mcdonald  appetite which depending on the food is what she will eat. We talked about nutrition and foods that she likes. We talked about residing in skilled facility. We talked about daily routine. We talked about mobility and getting out of bed. Melissa. Mcdonald endorses she has not been out of bed in some time now. We talked about her crafts at length. Melissa Mcdonald showed her work period we talked about residing in skilled facility with limitations. We talked about coping strategies. Medical goals reviewed. Melissa Mcdonald was cooperative with assessment. We talked about role palliative care and plan of care most supportive, monitoring for decline. I have attempted to contact Melissa Mcdonald daughter. I updated nursing staff no new changes at present time. History obtained from review of EMR, Facility staff, Melissa Mcdonald.  I reviewed available labs, medications, imaging, studies and related documents from the EMR.  Records reviewed and summarized above.   ROS Full 14 system review of systems performed and negative with exception of: as per HPI.   Physical Exam: Constitutional: NAD General: Mcdonald debilitated, pleasant female EYES: ids intact ENMT: oral mucous membranes moist CV: S1S2, RRR,  Pulmonary: LCTA, no increased work of breathing,  Abdomen: soft and non tender MSK: functional quadriplegic; w/c dependent Skin: warm and dry Neuro:  BLE generalized weakness,  no cognitive impairment Psych: non-anxious affect, A and O x 3  Questions and concerns were addressed. Provided general support and encouragement, no other unmet needs identified   Thank you for the opportunity to participate in the care of Melissa Mcdonald.  The palliative care team will continue to follow. Please call our office at [phone] if we can be of additional assistance.   This chart was dictated using voice recognition software.  Despite best efforts to proofread,  errors can occur which can change the documentation meaning.   Merced Hanners  Z Shalane Florendo, NPC

## 2021-02-23 ENCOUNTER — Non-Acute Institutional Stay: Payer: Medicare Other | Admitting: Nurse Practitioner

## 2021-02-23 ENCOUNTER — Encounter: Payer: Self-pay | Admitting: Nurse Practitioner

## 2021-02-23 VITALS — BP 132/68 | HR 78 | Temp 97.8°F | Resp 18 | Wt 230.0 lb

## 2021-02-23 DIAGNOSIS — Z515 Encounter for palliative care: Secondary | ICD-10-CM

## 2021-02-23 DIAGNOSIS — F4321 Adjustment disorder with depressed mood: Secondary | ICD-10-CM

## 2021-02-23 NOTE — Progress Notes (Signed)
[facility] Consult Note Telephone: [phone]  Fax: [phone]    Date of encounter: 02/23/21 9:03 PM PATIENT NAME: Melissa Mcdonald [address] [place] [address]   [phone] (home)  DOB: 1941-03-02 MRN: [id number] PRIMARY CARE PROVIDER:    Dr [facility]  RESPONSIBLE PARTY:    Contact Information     Name Relation Home Work Mobile   [place] Daughter   [phone]      I met face to face with patient in facility. Palliative Care was asked to follow this patient by consultation request of  Dr Nona Dell to address advance care planning and complex medical decision making. This is a follow up visit.                                   ASSESSMENT AND PLAN / RECOMMENDATIONS:  Symptom Management/Plan: 1. ACP: Medical goals to focus on DNR, do not intubate, no feeding tube, no surgical interventions but wishes are for antibiotics, blood transfusions, diagnostic testing, hospitalization, IV fluids, lab testing.    2. Grieving with recent loss of her roommate. Discussed and counseling completed. Offerred breavement, Melissa Mcdonald declined at this time.    3. Palliative care encounter/ Palliative medicine team will continue to support patient, patient's family, and medical team. Visit consisted of counseling and education dealing with the complex and emotionally intense issues of symptom management and palliative care in the setting of serious and potentially life-threatening illness   4. F/u 1 months if needed for ongoing complex decision making, chronic disease management  Follow up Palliative Care Visit: Palliative care will continue to follow for complex medical decision making, advance care planning, and clarification of goals. Return 4 weeks or prn.  I spent 37 minutes providing this consultation. More than 50% of the time in this consultation was spent in counseling and care coordination. PPS:  40%  Chief Complaint: Follow up palliative consult for complex medical decision making  HISTORY OF PRESENT ILLNESS:  Melissa Mcdonald is a 80 y.o. year old female  with multiple medical problems including Pulmonary hypertension, diastolic congestive heart failure, COPD, chronic hypoxic respiratory failure, chronic kidney disease, follicular lymphoma nodes of head, face, neck, obstructive sleep apnea, morbid obesity, diabetes, gout, vitamin D deficiency, vitamin B12 deficiency, anemia, single non-toxic thyroid nodule, polyneuropathy, vascular disease, anemia. Melissa Mcdonald continues to reside at [place] at [facility]. Melissa Mcdonald does require were left for transfers. Melissa Mcdonald is able to get up in the wheelchair, being mobile with her upper extremities going where she wishes at the facility. Melissa Mcdonald does require staff assistance for bathing, dressing, toileting. Melissa Mcdonald is able to feed herself. Current weight 230 lbs. Staff endorses no recent falls, wounds, infections, hospitalizations. Staff endorses Melissa Mcdonald has recently lost her roommate of several years, recently passed away in the room. At present, Melissa Mcdonald is lying in bed, appears debilitated, comfortable. No visitors present. I visited and observed Melissa Mcdonald. We talked about how she has been feeling. We reviewed symptoms, currently all negative. Melissa Mcdonald endorses good appetite. She finished all of her crafts. We talked about the loss of her room-mate, discussed grief, offered bereavement counseling which Melissa Mcdonald declined.  We talked about her daily routine, reviewed medical goals. We talked about role pc in poc. No new recommendations, updated staff. I  attempted to contact Melissa Mcdonald daughter for update on pc visit.   History obtained from review of EMR, discussion with facility staff and  Melissa Mcdonald.  I reviewed available labs, medications, imaging,  studies and related documents from the EMR.  Records reviewed and summarized above.   ROS Full 14 system review of systems performed and negative with exception of: as per HPI.   Physical Exam: Constitutional: NAD General: obese, debilitated, pleasant female EYES: lids intact ENMT: oral mucous membranes moist CV: S1S2, RRR Pulmonary: LCTA, no increased work of breathing, no cough Abdomen: soft and non tender MSK: bed-bound; w/c dependent Skin: warm and dry Neuro:  + generalized weakness,  no cognitive impairment Psych: non-anxious affect, A and O x 3  Questions and concerns were addressed. Provided general support and encouragement, no other unmet needs identified   Thank you for the opportunity to participate in the care of Melissa Mcdonald.  The palliative care team will continue to follow. Please call our office at [phone] if we can be of additional assistance.   This chart was dictated using voice recognition software.  Despite best efforts to proofread,  errors can occur which can change the documentation meaning.   Mike Berntsen Ihor Gully, NP

## 2021-02-25 ENCOUNTER — Other Ambulatory Visit: Payer: Self-pay

## 2021-03-30 ENCOUNTER — Other Ambulatory Visit: Payer: Self-pay

## 2021-03-30 ENCOUNTER — Non-Acute Institutional Stay: Payer: Medicare Other | Admitting: Primary Care

## 2021-03-30 VITALS — Ht 63.0 in | Wt 230.0 lb

## 2021-03-30 DIAGNOSIS — I272 Pulmonary hypertension, unspecified: Secondary | ICD-10-CM

## 2021-03-30 DIAGNOSIS — R0602 Shortness of breath: Secondary | ICD-10-CM

## 2021-03-30 DIAGNOSIS — R600 Localized edema: Secondary | ICD-10-CM

## 2021-03-30 DIAGNOSIS — Z515 Encounter for palliative care: Secondary | ICD-10-CM

## 2021-03-30 NOTE — Progress Notes (Signed)
[facility] Consult Note Telephone: [phone]  Fax: [phone]    Date of encounter: 03/30/21 9:06 PM PATIENT NAME: Melissa Mcdonald [place] [address]   [phone] (home)  DOB: [birth year] MRN: [id number] PRIMARY CARE PROVIDER:    Brayton Mars, MD,  [address] [place] [address] [phone]  REFERRING PROVIDER:   Brayton Mars, MD,  [address] [place] [address] [phone]  RESPONSIBLE PARTY:    Contact Information     Name Relation Home Work Mobile   [place] Daughter   [phone]        I met face to face with patient in [facility] facility. Palliative Care was asked to follow this patient by consultation request of  Brayton Mars, MD  to address advance care planning and complex medical decision making. This is a follow up visit.                                   ASSESSMENT AND PLAN / RECOMMENDATIONS:   Advance Care Planning/Goals of Care: Goals include to maximize quality of life and symptom management. Our advance care planning conversation included a discussion about:    Exploration of personal, cultural or spiritual beliefs that might influence medical decisions  Identification  of a healthcare agent - daughter  CODE STATUS: DNR on file, no MOST in record  Symptom Management/Plan:  I met with patient in her nursing home room today. She was awake and alert and oriented to self and time. She states her transition from [facility] to [facility] was difficult but now she's settling in to her new  skilled facility. She's currently working on crafts and denies pain nausea and constipation. She states that she enjoys crafts and has visits from various family members which cheer her.   Follow up Palliative Care Visit: Palliative care will continue to follow for complex medical decision making, advance care planning, and clarification of goals. Return 6 weeks or prn.  I spent 25  minutes providing this consultation. More than 50% of the time in this consultation was spent in counseling and care coordination.  PPS: 40%  HOSPICE ELIGIBILITY/DIAGNOSIS: TBD  Chief Complaint: debility  HISTORY OF PRESENT ILLNESS:  Melissa Mcdonald is a 80 y.o. year old female  with debility, immobility, OA, anemia, obesity .   History obtained from review of EMR, discussion with primary team, and interview with family, facility staff/caregiver and/or Melissa Mcdonald.  I reviewed available labs, medications, imaging, studies and related documents from the EMR.  Records reviewed and summarized above.   ROS   General: NAD EYES: denies vision changes, has glasses ENMT: denies dysphagia Cardiovascular: denies chest pain, denies DOE Pulmonary: denies cough, denies increased SOB Abdomen: endorses good appetite, denies constipation, endorses continence of bowel GU: denies dysuria, endorses continence of urine MSK:  denies increased weakness,  no falls reported Skin: denies rashes or wounds Neurological: denies pain, denies insomnia Psych: Endorses positive mood Heme/lymph/immuno: denies bruises, abnormal bleeding  Physical Exam: Current and past weights: 230 lbs, Body mass index is 40.74 kg/m. Constitutional: NAD General: frail appearing EYES: anicteric sclera, lids intact, no discharge  ENMT: intact hearing, oral mucous membranes moist CV: no LE edema Pulmonary:  no increased work of breathing, no cough, room air Abdomen: intake 100%, no ascites GU: deferred MSK: mod  sarcopenia, moves all extremities, non ambulatory Skin: warm and dry,  no rashes or wounds on visible skin Neuro:  +generalized weakness,  mild  cognitive impairment Psych: non-anxious affect, A and O x 3 Hem/lymph/immuno: no widespread bruising   Thank you for the opportunity to participate in the care of Melissa Mcdonald.  The palliative care team will continue to follow. Please call our office at [phone] if  we can be of additional assistance.   Jason Coop, NP DNP, AGPCNP-BC  COVID-19 PATIENT SCREENING TOOL Asked and negative response unless otherwise noted:   Have you had symptoms of covid, tested positive or been in contact with someone with symptoms/positive test in the past 5-10 days?

## 2021-05-18 ENCOUNTER — Other Ambulatory Visit: Payer: Self-pay

## 2021-05-18 ENCOUNTER — Non-Acute Institutional Stay: Payer: Medicare Other | Admitting: Primary Care

## 2021-05-20 ENCOUNTER — Non-Acute Institutional Stay: Payer: Medicare Other | Admitting: Primary Care

## 2021-05-20 ENCOUNTER — Other Ambulatory Visit: Payer: Self-pay

## 2021-05-20 DIAGNOSIS — I272 Pulmonary hypertension, unspecified: Secondary | ICD-10-CM

## 2021-05-20 DIAGNOSIS — Z515 Encounter for palliative care: Secondary | ICD-10-CM

## 2021-05-20 DIAGNOSIS — R0602 Shortness of breath: Secondary | ICD-10-CM

## 2021-05-20 NOTE — Progress Notes (Signed)
[facility] Consult Note Telephone: [phone]  Fax: [phone]    Date of encounter: 05/20/21 2:27 PM PATIENT NAME: Melissa Mcdonald [place] [address]   [phone] (home)  DOB: 04/28/1941 MRN: [id number] PRIMARY CARE PROVIDER:    Brayton Mars, MD,  [address] [place] [address] [phone]  REFERRING PROVIDER:   Brayton Mars, [place],  [place] [address] [phone]  RESPONSIBLE PARTY:    Contact Information     Name Relation Home Work Mobile   [place] Daughter   [phone]        I met face to face with patient in [facility] facility. Palliative Care was asked to follow this patient by consultation request of  Brayton Mars, MD to address advance care planning and complex medical decision making. This is a follow up visit.                                   ASSESSMENT AND PLAN / RECOMMENDATIONS:   Advance Care Planning/Goals of Care: Goals include to maximize quality of life and symptom management. Our advance care planning conversation included a discussion about:     Exploration of personal, cultural or spiritual beliefs that might influence medical decisions  Identification  of a healthcare agent - Daughter if needed but pt making own decisions now  CODE STATUS: DNR  Symptom Management/Plan:  Patient endorses getting settled in new faciliy now for several months. She endorse good appetite most of the time. She states some phantom pain and is on gabapentin. Recommend mirror therapy if desired. Encouraged to get OOB in chair more often. Staff states she 's at her baseline for physical and emotional health and well being.  Follow up Palliative Care Visit: Palliative care will continue to follow for complex medical decision making, advance care planning, and clarification of goals. Return 8-12 weeks or prn.  I spent 15 minutes providing this consultation.  More than 50% of the time in this consultation was spent in counseling and care coordination.  PPS: 40%  HOSPICE ELIGIBILITY/DIAGNOSIS: TBD  Chief Complaint: immobility  HISTORY OF PRESENT ILLNESS:  Melissa Mcdonald is a 80 y.o. year old female  with lymphoma, OA, amputation, DM, obesity.  .  History obtained from review of EMR, discussion with primary team, and interview with family, facility staff/caregiver and/or Ms. Bogen.  I reviewed available labs, medications, imaging, studies and related documents from the EMR.  Records reviewed and summarized above.   ROS  General: NAD EYES: denies vision changes, uses glasses ENMT: denies dysphagia Cardiovascular: denies chest pain, denies DOE Pulmonary: denies cough, denies increased SOB Abdomen: endorses good appetite, denies constipation, endorses incontinence of bowel GU: denies dysuria, endorses incontinence of urine MSK:  endorses  weakness,  no falls reported Skin: denies rashes or wounds Neurological: endorses phantom pain, denies insomnia Psych: Endorses positive mood Heme/lymph/immuno: denies bruises, abnormal bleeding  Physical Exam: Current and past weights:stable  Constitutional: NAD General: frail appearing, obese  EYES: anicteric sclera, lids intact, no discharge  ENMT: intact hearing, oral mucous membranes moist, dentition partially intact CV: S1S2, RRR, no LE edema Pulmonary: LCTA, no increased work of breathing, no cough, room air Abdomen: intake 100%, normo-active BS + 4 quadrants, soft and non tender, no ascites GU: deferred MSK: no sarcopenia, moves all extremities, non ambulatory Skin: warm and dry, no rashes or wounds  on visible skin Neuro:  ++ generalized weakness,  no cognitive impairment Psych: non-anxious affect, A and O x 3 Hem/lymph/immuno: no widespread bruising  Thank you for the opportunity to participate in the care of Ms. Melissa Mcdonald.  The palliative care team will continue to follow. Please  call our office at [phone] if we can be of additional assistance.   Jason Coop, NP DNP, AGPCNP-BC  COVID-19 PATIENT SCREENING TOOL Asked and negative response unless otherwise noted:   Have you had symptoms of covid, tested positive or been in contact with someone with symptoms/positive test in the past 5-10 days?

## 2021-05-26 ENCOUNTER — Other Ambulatory Visit: Payer: Self-pay

## 2021-05-26 DIAGNOSIS — Z1231 Encounter for screening mammogram for malignant neoplasm of breast: Secondary | ICD-10-CM

## 2021-07-06 ENCOUNTER — Other Ambulatory Visit: Payer: Self-pay | Admitting: Ophthalmology

## 2021-07-06 DIAGNOSIS — H5711 Ocular pain, right eye: Secondary | ICD-10-CM

## 2021-07-07 ENCOUNTER — Encounter: Payer: Self-pay | Admitting: Hematology and Oncology

## 2021-07-14 ENCOUNTER — Telehealth: Payer: Self-pay

## 2021-07-14 ENCOUNTER — Ambulatory Visit
Admission: RE | Admit: 2021-07-14 | Discharge: 2021-07-14 | Disposition: A | Discharge status: Home / Self Care | Payer: Medicare Other | Source: Ambulatory Visit | Attending: Surgery | Admitting: Surgery

## 2021-07-14 ENCOUNTER — Other Ambulatory Visit: Payer: Self-pay

## 2021-07-14 DIAGNOSIS — Z1231 Encounter for screening mammogram for malignant neoplasm of breast: Secondary | ICD-10-CM | POA: Diagnosis present

## 2021-07-14 NOTE — Telephone Encounter (Signed)
Pt's daughter [name] notified of normal mammo results. Verbalizes understanding.

## 2021-07-18 ENCOUNTER — Encounter: Payer: Self-pay | Admitting: Hematology and Oncology

## 2021-07-19 ENCOUNTER — Ambulatory Visit: Payer: Commercial Managed Care - HMO

## 2021-07-25 ENCOUNTER — Encounter: Payer: Self-pay | Admitting: Surgery

## 2021-07-25 ENCOUNTER — Other Ambulatory Visit: Payer: Self-pay

## 2021-07-25 ENCOUNTER — Ambulatory Visit (INDEPENDENT_AMBULATORY_CARE_PROVIDER_SITE_OTHER): Payer: Medicare Other | Admitting: Surgery

## 2021-07-25 VITALS — BP 147/81 | HR 66 | Temp 98.3°F | Ht 60.0 in | Wt 119.0 lb

## 2021-07-25 DIAGNOSIS — Z853 Personal history of malignant neoplasm of breast: Secondary | ICD-10-CM

## 2021-07-25 DIAGNOSIS — Z1239 Encounter for other screening for malignant neoplasm of breast: Secondary | ICD-10-CM

## 2021-07-25 NOTE — Patient Instructions (Addendum)
We will have you follow up here in one year with your mammogram prior. We will send you a letter about these appointments.   Continue self breast exams. Call office for any new breast issues or concerns.  Breast Self-Awareness Breast self-awareness is knowing how your breasts look and feel. Doing breast self-awareness is important. It allows you to catch a breast problem early while it is still small and can be treated. All women should do breast self-awareness, including women who have had breast implants. Tell your doctor if you notice a change in your breasts. What you need: A mirror. A well-lit room. How to do a breast self-exam A breast self-exam is one way to learn what is normal for your breasts and to check for changes. To do a breast self-exam: Look for changes  Take off all the clothes above your waist. Stand in front of a mirror in a room with good lighting. Put your hands on your hips. Push your hands down. Look at your breasts and nipples in the mirror to see if one breast or nipple looks different from the other. Check to see if: The shape of one breast is different. The size of one breast is different. There are wrinkles, dips, and bumps in one breast and not the other. Look at each breast for changes in the skin, such as: Redness. Scaly areas. Look for changes in your nipples, such as: Liquid around the nipples. Bleeding. Dimpling. Redness. A change in where the nipples are. Feel for changes  Lie on your back on the floor. Feel each breast. To do this, follow these steps: Pick a breast to feel. Put the arm closest to that breast above your head. Use your other arm to feel the nipple area of your breast. Feel the area with the pads of your three middle fingers by making small circles with your fingers. For the first circle, press lightly. For the second circle, press harder. For the third circle, press even harder. Keep making circles with your fingers at the  different pressures as you move down your breast. Stop when you feel your ribs. Move your fingers a little toward the center of your body. Start making circles with your fingers again, this time going up until you reach your collarbone. Keep making up-and-down circles until you reach your armpit. Remember to keep using the three pressures. Feel the other breast in the same way. Sit or stand in the tub or shower. With soapy water on your skin, feel each breast the same way you did in step 2 when you were lying on the floor. Write down what you find Writing down what you find can help you remember what to tell your doctor. Write down: What is normal for each breast. Any changes you find in each breast, including: The kind of changes you find. Whether you have pain. Size and location of any lumps. When you last had your menstrual period. General tips Check your breasts every month. If you are breastfeeding, the best time to check your breasts is after you feed your baby or after you use a breast pump. If you get menstrual periods, the best time to check your breasts is 5-7 days after your menstrual period is over. With time, you will become comfortable with the self-exam, and you will begin to know if there are changes in your breasts. Contact a doctor if you: See a change in the shape or size of your breasts or nipples. See a  change in the skin of your breast or nipples, such as red or scaly skin. Have fluid coming from your nipples that is not normal. Find a lump or thick area that was not there before. Have pain in your breasts. Have any concerns about your breast health. Summary Breast self-awareness includes looking for changes in your breasts, as well as feeling for changes within your breasts. Breast self-awareness should be done in front of a mirror in a well-lit room. You should check your breasts every month. If you get menstrual periods, the best time to check your breasts is  5-7 days after your menstrual period is over. Let your doctor know of any changes you see in your breasts, including changes in size, changes on the skin, pain or tenderness, or fluid from your nipples that is not normal. This information is not intended to replace advice given to you by your health care provider. Make sure you discuss any questions you have with your health care provider. Document Revised: 01/08/2018 Document Reviewed: 01/08/2018 Elsevier Patient Education  [employer].

## 2021-07-27 ENCOUNTER — Encounter: Payer: Self-pay | Admitting: Hematology and Oncology

## 2021-07-27 ENCOUNTER — Other Ambulatory Visit: Payer: Self-pay

## 2021-07-27 ENCOUNTER — Non-Acute Institutional Stay: Payer: Commercial Managed Care - HMO | Admitting: Primary Care

## 2021-07-27 DIAGNOSIS — C8511 Unspecified B-cell lymphoma, lymph nodes of head, face, and neck: Secondary | ICD-10-CM

## 2021-07-27 DIAGNOSIS — I272 Pulmonary hypertension, unspecified: Secondary | ICD-10-CM

## 2021-07-27 DIAGNOSIS — Z515 Encounter for palliative care: Secondary | ICD-10-CM

## 2021-07-27 NOTE — Progress Notes (Signed)
[facility] Palliative Care Consult Note Telephone: [phone]  Fax: [phone]    Date of encounter: 07/27/21 1:07 PM PATIENT NAME: Melissa Mcdonald [place] [address]   [phone] (home)  DOB: 1941/05/12 MRN: [id number] PRIMARY CARE PROVIDER:    Brayton Mars, MD,  [address] [place] [address] [phone]  REFERRING PROVIDER:   Brayton Mars, [address] [place],  [place] [address] [phone]  RESPONSIBLE PARTY:    Contact Information     Name Relation Home Work Mobile   [place] Daughter   [phone]        I met face to face with patient in [facility] facility. Palliative Care was asked to follow this patient by consultation request of  Brayton Mars, MD to address advance care planning and complex medical decision making. This is a follow up visit.                                   ASSESSMENT AND PLAN / RECOMMENDATIONS:   Advance Care Planning/Goals of Care: Goals include to maximize quality of life and symptom management. Patient/health care surrogate gave his/her permission to discuss.Our conversation included a discussion about:    Exploration of personal, cultural or spiritual beliefs that might influence medical decisions  CODE STATUS: DNR  Symptom Management/Plan:   I visited with patient in her nursing home room. She is bedbound although gets up to a chair for outings such as the  doctor. She endorses a fairly good appetite.  Weight appears stable. She points out her 81th birthday is coming up and states that her brother is coming to visit. We discussed her  crafting which she says gives her purpose and enjoyment and helps with her anxiety. She had no other requests but does our continued palliative care following.   Follow up Palliative Care Visit: Palliative care will continue to follow for complex medical decision making, advance care planning, and clarification of goals.  Return 8 weeks or prn.  I spent 25 minutes providing this consultation. More than 50% of the time in this consultation was spent in counseling and care coordination.  PPS: 30%  HOSPICE ELIGIBILITY/DIAGNOSIS: TBD  Chief Complaint: immobility  HISTORY OF PRESENT ILLNESS:  Melissa Mcdonald is a 81 y.o. year old female  with COPD, obesity, lymphedema, immobility. Presents for ongoing PC assessment for care management and sx support.  History obtained from review of EMR, discussion with primary team, and interview with family, facility staff/caregiver and/or Ms. Kroft.  I reviewed available labs, medications, imaging, studies and related documents from the EMR.  Records reviewed and summarized above.   ROS   General: NAD EYES: denies vision changes ENMT: denies dysphagia Cardiovascular: denies chest pain, denies DOE Pulmonary: denies cough, denies increased SOB, uses cpap Abdomen: endorses good appetite, denies constipation, endorses incontinence of bowel GU: denies dysuria, endorses incontinence of urine MSK:  denies  increased weakness,  no falls reported Skin: denies rashes or wounds Neurological: denies pain, denies insomnia Psych: Endorses positive mood Heme/lymph/immuno: denies bruises, abnormal bleeding  Physical Exam: Current and past weights: 230 labs, BMI= 40 Constitutional: NAD General: frail appearing, obese  EYES: anicteric sclera, lids intact, no discharge  ENMT: intact hearing, oral mucous membranes moist, dentition intact Pulmonary: no increased work of breathing, no cough, room air Abdomen: intake 100%,  no ascites GU: deferred MSK: no sarcopenia, moves all  UE extremities,  non ambulatory Skin: warm and dry, no rashes or wounds on visible skin Neuro:  + generalized weakness,  no cognitive impairment Psych: non-anxious affect, A and O x 3 Hem/lymph/immuno: no widespread bruising   Thank you for the opportunity to participate in the care of Ms.  Grall.  The palliative care team will continue to follow. Please call our office at [phone] if we can be of additional assistance.   Jason Coop, NP DNP, AGPCNP-BC  COVID-19 PATIENT SCREENING TOOL Asked and negative response unless otherwise noted:   Have you had symptoms of covid, tested positive or been in contact with someone with symptoms/positive test in the past 5-10 days?
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
## 2021-08-15 ENCOUNTER — Encounter: Payer: Self-pay | Admitting: Hematology and Oncology

## 2021-08-25 ENCOUNTER — Other Ambulatory Visit: Payer: Self-pay | Admitting: Ophthalmology

## 2021-09-13 ENCOUNTER — Encounter: Payer: Self-pay | Admitting: Hematology and Oncology

## 2021-09-15 ENCOUNTER — Non-Acute Institutional Stay: Payer: Medicare (Managed Care) | Admitting: Primary Care

## 2021-09-15 DIAGNOSIS — R0602 Shortness of breath: Secondary | ICD-10-CM

## 2021-09-15 DIAGNOSIS — Z515 Encounter for palliative care: Secondary | ICD-10-CM

## 2021-09-15 DIAGNOSIS — C8511 Unspecified B-cell lymphoma, lymph nodes of head, face, and neck: Secondary | ICD-10-CM

## 2021-09-15 NOTE — Progress Notes (Signed)
? ? ?Manufacturing engineer ?Community Palliative Care Consult Note ?Telephone: [phone]  ?Fax: [phone]  ? ? ?Date of encounter: 09/15/21 ?4:42 PM ?PATIENT NAME: Melissa Mcdonald ?[place] [address]   ?[phone] (home)  ?DOB: July 31, 1940 ?MRN: [id number] ?PRIMARY CARE PROVIDER:    ?Brayton Mars, MD,  ?[address] ?[place] [address] ?[phone] ? ?REFERRING PROVIDER:   ?Brayton Mars, MD ?[address] ?[place],  [place] [address] ?[phone] ? ?RESPONSIBLE PARTY:    ?Contact Information   ? ? Name Relation Home Work Mobile  ? [name] Daughter   [phone]  ? ?  ? ? ?I met face to face with patient in [facility] facility. Palliative Care was asked to follow this patient by consultation request of  Brayton Mars, MD to address advance care planning and complex medical decision making. This is a follow up visit. ? ?                                 ASSESSMENT AND PLAN / RECOMMENDATIONS:  ? ?Advance Care Planning/Goals of Care: Goals include to maximize quality of life and symptom management. Patient/health care surrogate gave his/her permission to discuss.Our advance care planning conversation included a discussion about:    ? ?Exploration of personal, cultural or spiritual beliefs that might influence medical decisions  ?CODE STATUS: DNR on file ? ?Symptom Management/Plan: ? ? I met with patient in her nursing home room. She endorses good spirits generally. She is working on [employer] which gives her purpose. She states her mood is generally good. Staff do not report any concerns. She seems to made a good transition from her previous living situation ? ?Follow up Palliative Care Visit: Palliative care will continue to follow for complex medical decision making, advance care planning, and clarification of goals. Return 6-8 weeks or prn. ? ?This visit was coded based on medical decision making (MDM). ? ?PPS: 40% ? ?HOSPICE ELIGIBILITY/DIAGNOSIS: TBD ? ?Chief  Complaint: debility, obesity ? ?HISTORY OF PRESENT ILLNESS:  Melissa Mcdonald is a 81 y.o. year old female  with debility, immobility, obesity, depression . Patient seen today to review palliative care needs to include medical decision making and advance care planning as appropriate.  ? ?History obtained from review of EMR, discussion with primary team, and interview with family, facility staff/caregiver and/or Ms. Memmer.  ?I reviewed available labs, medications, imaging, studies and related documents from the EMR.  Records reviewed and summarized above.  ? ?ROS ? ? ?General: NAD ?EYES: denies vision changes ?ENMT: denies dysphagia ?Cardiovascular: denies chest pain, denies DOE ?Pulmonary: denies cough, denies increased SOB ?Abdomen: endorses good appetite, denies constipation, endorses continence of bowel ?GU: denies dysuria, endorses continence of urine ?MSK:  denies  increased weakness,  no falls reported ?Skin: denies rashes or wounds ?Neurological: denies pain, denies insomnia ?Psych: Endorses positive mood ? ?Physical Exam: ?Current and past weights: 217 lbs, 8 lbs loss in 2 years. ?Constitutional: NAD ?General: frail appearing, obese  ?EYES: anicteric sclera, lids intact, no discharge  ?ENMT: intact hearing, oral mucous membranes moist ?Pulmonary: no increased work of breathing, no cough, room air ?Abdomen: intake 100%, soft and non tender, no ascites ?MSK: mild  sarcopenia,  non ambulatory, L leg amputation ?Skin: warm and dry, no rashes or wounds on visible skin ?Neuro:  + generalized weakness,  no cognitive impairment, non-anxious affect ? ?Thank you for the opportunity to participate in the  care of [name].  The palliative care team will continue to follow. Please call our office at [phone] if we can be of additional assistance.  ? ?Jason Coop, NP DNP, AGPCNP-BC ? ?COVID-19 PATIENT SCREENING TOOL ?Asked and negative response unless otherwise noted:  ? ?Have you had symptoms of  covid, tested positive or been in contact with someone with symptoms/positive test in the past 5-10 days?  ? ?

## 2021-09-22 ENCOUNTER — Encounter: Payer: Self-pay | Admitting: Hematology and Oncology

## 2022-01-24 ENCOUNTER — Ambulatory Visit: Payer: Medicare Other

## 2022-01-31 ENCOUNTER — Encounter: Payer: Self-pay | Admitting: Hematology and Oncology

## 2022-01-31 ENCOUNTER — Non-Acute Institutional Stay: Payer: Medicare Other | Admitting: Primary Care

## 2022-01-31 DIAGNOSIS — I272 Pulmonary hypertension, unspecified: Secondary | ICD-10-CM

## 2022-01-31 DIAGNOSIS — Z515 Encounter for palliative care: Secondary | ICD-10-CM

## 2022-01-31 DIAGNOSIS — R0602 Shortness of breath: Secondary | ICD-10-CM

## 2022-01-31 NOTE — Progress Notes (Signed)
[facility] Palliative Care Consult Note Telephone: [phone]  Fax: [phone]    Date of encounter: 01/31/22 11:56 AM PATIENT NAME: Melissa Mcdonald [address]   [phone] (home)  DOB: [birth year] MRN: [id number] PRIMARY CARE PROVIDER:    Brayton Mars, MD,  [address] [place] [address] [phone]  REFERRING PROVIDER:   Brayton Mars, [address] [facility] [place],  [place] [address] [phone]  RESPONSIBLE PARTY:    Contact Information     Name Relation Home Work Mobile   [place] Daughter   [phone]        I met face to face with patient in [facility] facility. Palliative Care was asked to follow this patient by consultation request of  Brayton Mars, MD to address advance care planning and complex medical decision making. This is a follow up visit.                                   ASSESSMENT AND PLAN / RECOMMENDATIONS:   Advance Care Planning/Goals of Care: Goals include to maximize quality of life and symptom management. Patient/health care surrogate gave his/her permission to discuss.Our advance care planning conversation included a discussion about:     Exploration of personal, cultural or spiritual beliefs that might influence medical decisions CODE STATUS: DNR  Symptom Management/Plan:  Has sleep study for today she thinks, although not in Epic. Needs for new CPAP. Last study > 20 years ago. Using oxygen at hs until study done.Denies head ache, increased SOB.   Endorses immobility, difficulty getting back to bed so does not get up much. Has crafts to do which improve her mood. Reports no medication changes.   Follow up Palliative Care Visit: Palliative care will continue to follow for complex medical decision making, advance care planning, and clarification of goals. Return 12 weeks or prn.  I spent 15 minutes providing this consultation. More than 50% of the time in this  consultation was spent in counseling and care coordination  PPS: 40%  HOSPICE ELIGIBILITY/DIAGNOSIS: TBD  Chief Complaint: debility, OSA  HISTORY OF PRESENT ILLNESS:  Melissa Mcdonald is a 81 y.o. year old female  with OSA, debility, immobility, h/o migraine, OA . Patient seen today to review palliative care needs to include medical decision making and advance care planning as appropriate.   History obtained from review of EMR, discussion with primary team, and interview with family, facility staff/caregiver and/or [name].  I reviewed available labs, medications, imaging, studies and related documents from the EMR.  Records reviewed and summarized above.   ROS   General: NAD ENMT: denies dysphagia Pulmonary: denies cough, denies increased SOB Abdomen: endorses good appetite, denies constipation, endorses incontinence of bowel GU: denies dysuria, endorses incontinence of urine MSK:  denies increased weakness,  no falls reported Skin: denies rashes or wounds Neurological: denies pain, endorses occ  insomnia Psych: Endorses positive mood  Physical Exam: Current and past weights: stable Constitutional: NAD General: frail appearing, obese  EYES: anicteric sclera, lids intact, no discharge  ENMT: intact hearing, oral mucous membranes moist, dentition intact CV: no LE edema Pulmonary: no increased work of breathing, no cough, room air Abdomen: intake 100%, soft and non tender, no ascites MSK:  +sarcopenia, non-ambulatory Skin: warm and dry, no rashes or wounds on visible skin Neuro:  + generalized weakness,  no cognitive impairment, non-anxious affect   Thank you  for the opportunity to participate in the care of [name].  The palliative care team will continue to follow. Please call our office at [phone] if we can be of additional assistance.   Jason Coop, NP DNP, AGPCNP-BC  COVID-19 PATIENT SCREENING TOOL Asked and negative response unless  otherwise noted:   Have you had symptoms of covid, tested positive or been in contact with someone with symptoms/positive test in the past 5-10 days?

## 2022-02-01 ENCOUNTER — Ambulatory Visit: Payer: Medicare Other | Attending: Specialist

## 2022-02-01 DIAGNOSIS — G4733 Obstructive sleep apnea (adult) (pediatric): Secondary | ICD-10-CM | POA: Diagnosis present

## 2022-02-02 ENCOUNTER — Other Ambulatory Visit: Payer: Self-pay

## 2022-02-02 ENCOUNTER — Emergency Department
Admission: EM | Admit: 2022-02-02 | Discharge: 2022-02-02 | Disposition: A | Discharge status: Skilled Nursing Facility | Payer: Medicare Other | Attending: Emergency Medicine | Admitting: Emergency Medicine

## 2022-02-02 DIAGNOSIS — Z466 Encounter for fitting and adjustment of urinary device: Secondary | ICD-10-CM | POA: Diagnosis present

## 2022-02-02 DIAGNOSIS — G4730 Sleep apnea, unspecified: Secondary | ICD-10-CM | POA: Diagnosis not present

## 2022-02-02 DIAGNOSIS — J449 Chronic obstructive pulmonary disease, unspecified: Secondary | ICD-10-CM | POA: Insufficient documentation

## 2022-02-02 DIAGNOSIS — I509 Heart failure, unspecified: Secondary | ICD-10-CM | POA: Diagnosis not present

## 2022-02-02 LAB — CBC WITH DIFFERENTIAL/PLATELET
Abs Immature Granulocytes: 0.03 10*3/uL (ref 0.00–0.07)
Basophils Absolute: 0.1 10*3/uL (ref 0.0–0.1)
Basophils Relative: 1 %
Eosinophils Absolute: 0.2 10*3/uL (ref 0.0–0.5)
Eosinophils Relative: 3 %
HCT: 38.2 % (ref 36.0–46.0)
Hemoglobin: 11.9 g/dL — ABNORMAL LOW (ref 12.0–15.0)
Immature Granulocytes: 0 %
Lymphocytes Relative: 18 %
Lymphs Abs: 1.6 10*3/uL (ref 0.7–4.0)
MCH: 31 pg (ref 26.0–34.0)
MCHC: 31.2 g/dL (ref 30.0–36.0)
MCV: 99.5 fL (ref 80.0–100.0)
Monocytes Absolute: 0.6 10*3/uL (ref 0.1–1.0)
Monocytes Relative: 7 %
Neutro Abs: 6.7 10*3/uL (ref 1.7–7.7)
Neutrophils Relative %: 71 %
Platelets: 223 10*3/uL (ref 150–400)
RBC: 3.84 MIL/uL — ABNORMAL LOW (ref 3.87–5.11)
RDW: 14.3 % (ref 11.5–15.5)
WBC: 9.2 10*3/uL (ref 4.0–10.5)
nRBC: 0 % (ref 0.0–0.2)

## 2022-02-02 LAB — COMPREHENSIVE METABOLIC PANEL
ALT: 14 U/L (ref 0–44)
AST: 14 U/L — ABNORMAL LOW (ref 15–41)
Albumin: 3.6 g/dL (ref 3.5–5.0)
Alkaline Phosphatase: 68 U/L (ref 38–126)
Anion gap: 9 (ref 5–15)
BUN: 21 mg/dL (ref 8–23)
CO2: 30 mmol/L (ref 22–32)
Calcium: 8.9 mg/dL (ref 8.9–10.3)
Chloride: 100 mmol/L (ref 98–111)
Creatinine, Ser: 1.05 mg/dL — ABNORMAL HIGH (ref 0.44–1.00)
GFR, Estimated: 54 mL/min — ABNORMAL LOW (ref >60)
Glucose, Bld: 163 mg/dL — ABNORMAL HIGH (ref 70–99)
Potassium: 4.1 mmol/L (ref 3.5–5.1)
Sodium: 139 mmol/L (ref 135–145)
Total Bilirubin: 0.7 mg/dL (ref 0.3–1.2)
Total Protein: 6.5 g/dL (ref 6.5–8.1)

## 2022-02-02 NOTE — ED Notes (Signed)
Pt taken off [place] to get RA sat, pt sats remain 96% on RA
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
## 2022-02-02 NOTE — ED Triage Notes (Signed)
Pt arrives via [facility] from sleep study d/t low O2 levels- pt has a cpap but it is broken so she was getting a new sleep study done to get a new prescription for one- however pt O2 levels were down into the 50's on RA while she was awake per the nurse doing the sleep study- pt is not normally on O2 per EMS and arrives on 3L [place]

## 2022-02-02 NOTE — Discharge Instructions (Addendum)
Please continue wearing your oxygen at night until you see pulmonology and/or you receive your new CPAP machine.  If you develop any symptoms of concern such as shortness of breath or  chest pain please return to the emergency department.  If you are having difficulty urinating, please tell staff and your nursing facility then follow-up with primary care.

## 2022-04-03 ENCOUNTER — Non-Acute Institutional Stay: Payer: Medicare Other | Admitting: Primary Care

## 2022-04-03 DIAGNOSIS — R0602 Shortness of breath: Secondary | ICD-10-CM

## 2022-04-03 DIAGNOSIS — Z515 Encounter for palliative care: Secondary | ICD-10-CM

## 2022-04-03 DIAGNOSIS — I272 Pulmonary hypertension, unspecified: Secondary | ICD-10-CM

## 2022-04-03 NOTE — Progress Notes (Signed)
[facility] Consult Note Telephone: [phone]  Fax: [phone]    Date of encounter: 04/03/22 12:10 PM PATIENT NAME: [place] [address]   [phone] (home)  DOB: Mar 18, 1941 MRN: [id number] PRIMARY CARE PROVIDER:    Brayton Mars, MD,  [address] [place] [address] [phone]  REFERRING PROVIDER:   Brayton Mars, [place],  [place] [address] [phone]  RESPONSIBLE PARTY:    Contact Information     Name Relation Home Work Mobile   Chass[place] Daughter   [phone]        I met face to face with patient in [facility] facility. Palliative Care was asked to follow this patient by consultation request of  Brayton Mars, MD to address advance care planning and complex medical decision making. This is a follow up visit.                                   ASSESSMENT AND PLAN / RECOMMENDATIONS:   Advance Care Planning/Goals of Care: Goals include to maximize quality of life and symptom management. Patient/health care surrogate gave his/her permission to discuss. Our advance care planning conversation included a discussion about:     Exploration of personal, cultural or spiritual beliefs that might influence medical decisions  Exploration of goals of care in the event of a sudden injury or illness  Identification of a healthcare agent - self CODE STATUS: DNR  Symptom Management/Plan:  Patient is doing well, using her cpap every night. No increase in SOB. Let me know she went to ED but has done fine since then.  Enjoying her crafts. Eating well, no pain or other complaints.  Follow up Palliative Care Visit: Palliative care will continue to follow for complex medical decision making, advance care planning, and clarification of goals. Return 8 weeks or prn.  This visit was coded based on medical decision making (MDM).  PPS: 40%  HOSPICE  ELIGIBILITY/DIAGNOSIS: TBD  Chief Complaint: debility, SOB  HISTORY OF PRESENT ILLNESS:  LORNA STROTHER is a 81 y.o. year old female  with pulmonary HTN, debility .   History obtained from review of EMR, discussion with primary team, and interview with family, facility staff/caregiver and/or Ms. Hsiung.  I reviewed available labs, medications, imaging, studies and related documents from the EMR.  Records reviewed and summarized above.   ROS  General: NAD Pulmonary: denies cough, denies increased SOB, CPAP at hs Abdomen: endorses good appetite, denies constipation, endorses continence of bowel GU: denies dysuria, endorses continence of urine MSK:  denies increased weakness,  no falls reported Skin: denies rashes or wounds Neurological: denies pain, denies insomnia Psych: Endorses positive mood Heme/lymph/immuno: denies bruises, abnormal bleeding  Physical Exam: Current and past weights: 204 lbs Constitutional: NAD General: frail appearing, obese  EYES: anicteric sclera, lids intact, no discharge  ENMT: intact hearing, oral mucous membranes moist, dentition missing Pulmonary:  no increased work of breathing, no cough, room air Abdomen: intake 100%, soft and non tender, no ascites GU: deferred MSK: mild sarcopenia, moves all upper extremities, non- ambulatory Skin: warm and dry, no rashes or wounds on visible skin Neuro:  no generalized weakness,  no cognitive impairment Psych: non-anxious affect, A and O x 3 Hem/lymph/immuno: no widespread bruising  Thank you for the opportunity to participate in the care of Ms. Kucinski.  The palliative care team will continue  to follow. Please call our office at [phone] if we can be of additional assistance.   Jason Coop, NPCOVID-19 PATIENT SCREENING TOOL Asked and negative response unless otherwise noted:   Have you had symptoms of covid, tested positive or been in contact with someone with symptoms/positive test in  the past 5-10 days?

## 2022-06-06 ENCOUNTER — Other Ambulatory Visit: Payer: Self-pay

## 2022-06-06 DIAGNOSIS — Z1231 Encounter for screening mammogram for malignant neoplasm of breast: Secondary | ICD-10-CM

## 2022-06-07 ENCOUNTER — Non-Acute Institutional Stay: Payer: Medicare Other | Admitting: Nurse Practitioner

## 2022-06-07 ENCOUNTER — Encounter: Payer: Self-pay | Admitting: Nurse Practitioner

## 2022-06-07 DIAGNOSIS — Z515 Encounter for palliative care: Secondary | ICD-10-CM

## 2022-06-07 DIAGNOSIS — R5381 Other malaise: Secondary | ICD-10-CM

## 2022-06-07 DIAGNOSIS — I272 Pulmonary hypertension, unspecified: Secondary | ICD-10-CM

## 2022-06-07 DIAGNOSIS — R0602 Shortness of breath: Secondary | ICD-10-CM

## 2022-06-07 NOTE — Progress Notes (Signed)
[place] Consult Note Telephone: [phone]  Fax: [phone]    Date of encounter: 06/07/22 6:37 PM PATIENT NAME: Melissa Mcdonald [address]   [phone] (home)  DOB: 1941-01-18 MRN: [id number] PRIMARY CARE PROVIDER:    [facility]  [address] [phone]  RESPONSIBLE PARTY:    Contact Information     Name Relation Home Work [place] Daughter   [phone]     I met face to face with patient in facility. Palliative Care was asked to follow this patient by consultation request of  [facility] to address advance care planning and complex medical decision making. This is a follow up visit.                                   ASSESSMENT AND PLAN / RECOMMENDATIONS:  Symptom Management/Plan: 1. ACP: Medical goals to focus on DNR   2. Edema/shortness of breath stable, secondary to pulmonary HTN, CHF/COPD monitor weights, edema  3. Debility, obesity, reviewed weights, discussed nutrition with Melissa Mcdonald, appetite, weights, mobility 06/06/2022 weight 220.6 lbs   4. Palliative care encounter/ Palliative medicine team will continue to support patient, patient's family, and medical team. Visit consisted of counseling and education dealing with the complex and emotionally intense issues of symptom management and palliative care in the setting of serious and potentially life-threatening illness   Follow up Palliative Care Visit: Palliative care will continue to follow for complex medical decision making, advance care planning, and clarification of goals. Return 4 to 8 weeks or prn.   I spent 45 minutes providing this consultation. More than 50% of the time in this consultation was spent in counseling and care coordination. PPS: 40%   Chief Complaint: Follow up palliative consult for complex medical decision making   HISTORY OF PRESENT ILLNESS:  KITANA GAGE is a 82 y.o. year old female  with multiple medical problems including Pulmonary hypertension, diastolic congestive heart failure, COPD, chronic hypoxic respiratory failure, chronic kidney disease, follicular lymphoma nodes of head, face, neck, obstructive sleep apnea, morbid obesity, diabetes, gout, vitamin D deficiency, vitamin B12 deficiency, anemia, single non-toxic thyroid nodule, polyneuropathy, vascular disease, anemia, h/o left breast cancer s/p lumpectomy/mammosite in 2014  T1c, N1 (intramammary node), ER/ PR positive, Her 2 neu not over expressing. Wide excision, SLN biopsy, partial breast radiation.  Melissa. Mcdonald continues to reside at [place] [facility]. Melissa. Mcdonald does require were left for transfers. Melissa. Mcdonald is able to get up in the wheelchair, being mobile with her upper extremities going where she wishes at the facility. Melissa. Mcdonald does require staff assistance for bathing, dressing, toileting. Melissa. Mcdonald is able to feed herself. Appetite has been good. Current weight 220.6 lbs. Staff endorses no recent falls, hospitalizations. At present Melissa Mcdonald is sitting up in bed, working on crafts. Melissa Mcdonald appears comfortable, no visitors present. Melissa Mcdonald is familiar to this provider from [facility] where she was a resident there. We talked about events lead to transfer to [facility], ros, functional debility, oob, mobility, daily routine. We talked about recent holiday, her daughter, supportive role. We talked about things she enjoys about the facility. We talked about sleep patterns with recent sleep study. Melissa Mcdonald does seem little slower to answer questions and does not always answer. Some  cognitive mild impairment, Melissa Mcdonald was cooperative with assessment, therapeutic listening, emotional support provided, questions answered. Medical goals, poc, medications reviewed. Will contact dtg for update on pc visit. Updated  staff, continue to monitor weights, appetite, mobility, disease progression, monitor for decline.    History obtained from review of EMR, discussion with facility staff and  Melissa. Mcdonald.  I reviewed available labs, medications, imaging, studies and related documents from the EMR.  Records reviewed and summarized above.    ROS 10 point system review of systems performed and negative with exception of as per HPI.    Physical Exam: Constitutional: NAD General: obese, debilitated, pleasant female EYES: lids intact ENMT: oral mucous membranes moist CV: S1S2, RRR Pulmonary: LCTA, no increased work of breathing, no cough Abdomen: soft and non tender MSK: bed-bound; w/c dependent; left aka Skin: warm and dry Neuro:  + generalized weakness,  +mild cognitive impairment Psych: non-anxious affect, A and O x 3   Questions and concerns were addressed. Provided general support and encouragement, no other unmet needs identified  Thank you for the opportunity to participate in the care of Melissa. Mcdonald. Please call our office at [phone] if we can be of additional assistance.   Taliana Mersereau Ihor Gully, NP

## 2022-07-17 ENCOUNTER — Ambulatory Visit
Admission: RE | Admit: 2022-07-17 | Discharge: 2022-07-17 | Disposition: A | Discharge status: Home / Self Care | Payer: Medicare Other | Source: Ambulatory Visit | Attending: Surgery | Admitting: Surgery

## 2022-07-17 DIAGNOSIS — Z1231 Encounter for screening mammogram for malignant neoplasm of breast: Secondary | ICD-10-CM | POA: Diagnosis present

## 2022-07-26 ENCOUNTER — Ambulatory Visit (INDEPENDENT_AMBULATORY_CARE_PROVIDER_SITE_OTHER): Payer: Medicare Other | Admitting: Surgery

## 2022-07-26 ENCOUNTER — Encounter: Payer: Self-pay | Admitting: Surgery

## 2022-07-26 VITALS — BP 134/71 | HR 54 | Temp 98.7°F

## 2022-07-26 DIAGNOSIS — Z853 Personal history of malignant neoplasm of breast: Secondary | ICD-10-CM | POA: Diagnosis not present

## 2022-07-26 NOTE — Patient Instructions (Signed)
If you have any concerns or questions, please feel free to call our office. Follow up in 1 year with Mammogram done prior.   Breast Self-Awareness Breast self-awareness is knowing how your breasts look and feel. You need to: Check your breasts on a regular basis. Tell your doctor about any changes. Become familiar with the look and feel of your breasts. This can help you catch a breast problem while it is still small and can be treated. You should do breast self-exams even if you have breast implants. What you need: A mirror. A well-lit room. A pillow or other soft object. How to do a breast self-exam Follow these steps to do a breast self-exam: Look for changes  Take off all the clothes above your waist. Stand in front of a mirror in a room with good lighting. Put your hands down at your sides. Compare your breasts in the mirror. Look for any difference between them, such as: A difference in shape. A difference in size. Wrinkles, dips, and bumps in one breast and not the other. Look at each breast for changes in the skin, such as: Redness. Scaly areas. Skin that has gotten thicker. Dimpling. Open sores (ulcers). Look for changes in your nipples, such as: Fluid coming out of a nipple. Fluid around a nipple. Bleeding. Dimpling. Redness. A nipple that looks pushed in (retracted), or that has changed position. Feel for changes Lie on your back. Feel each breast. To do this: Pick a breast to feel. Place a pillow under the shoulder closest to that breast. Put the arm closest to that breast behind your head. Feel the nipple area of that breast using the hand of your other arm. Feel the area with the pads of your three middle fingers by making small circles with your fingers. Use light, medium, and firm pressure. Continue the overlapping circles, moving downward over the breast. Keep making circles with your fingers. Stop when you feel your ribs. Start making circles with your  fingers again, this time going upward until you reach your collarbone. Then, make circles outward across your breast and into your armpit area. Squeeze your nipple. Check for discharge and lumps. Repeat these steps to check your other breast. Sit or stand in the tub or shower. With soapy water on your skin, feel each breast the same way you did when you were lying down. Write down what you find Writing down what you find can help you remember what to tell your doctor. Write down: What is normal for each breast. Any changes you find in each breast. These include: The kind of changes you find. A tender or painful breast. Any lump you find. Write down its size and where it is. When you last had your monthly period (menstrual cycle). General tips If you are breastfeeding, the best time to check your breasts is after you feed your baby or after you use a breast pump. If you get monthly bleeding, the best time to check your breasts is 5-7 days after your monthly cycle ends. With time, you will become comfortable with the self-exam. You will also start to know if there are changes in your breasts. Contact a doctor if: You see a change in the shape or size of your breasts or nipples. You see a change in the skin of your breast or nipples, such as red or scaly skin. You have fluid coming from your nipples that is not normal. You find a new lump or thick area. You  have breast pain. You have any concerns about your breast health. Summary Breast self-awareness includes looking for changes in your breasts and feeling for changes within your breasts. You should do breast self-awareness in front of a mirror in a well-lit room. If you get monthly periods (menstrual cycles), the best time to check your breasts is 5-7 days after your period ends. Tell your doctor about any changes you see in your breasts. Changes include changes in size, changes on the skin, painful or tender breasts, or fluid from your  nipples that is not normal. This information is not intended to replace advice given to you by your health care provider. Make sure you discuss any questions you have with your health care provider. Document Revised: 10/27/2021 Document Reviewed: 03/24/2021 Elsevier Patient Education  [employer].
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
## 2022-08-02 ENCOUNTER — Non-Acute Institutional Stay: Payer: Medicare Other | Admitting: Nurse Practitioner

## 2022-08-02 ENCOUNTER — Encounter: Payer: Self-pay | Admitting: Nurse Practitioner

## 2022-08-02 DIAGNOSIS — Z515 Encounter for palliative care: Secondary | ICD-10-CM

## 2022-08-02 DIAGNOSIS — R5381 Other malaise: Secondary | ICD-10-CM

## 2022-08-02 DIAGNOSIS — R0602 Shortness of breath: Secondary | ICD-10-CM

## 2022-08-02 DIAGNOSIS — I272 Pulmonary hypertension, unspecified: Secondary | ICD-10-CM

## 2022-08-02 NOTE — Progress Notes (Signed)
[facility] Consult Note Telephone: [phone]  Fax: [phone]    Date of encounter: 08/02/22 3:38 PM PATIENT NAME: Melissa Mcdonald [address] [place] [address]   [phone] (home)  DOB: July 23, 1940 MRN: [id number] PRIMARY CARE PROVIDER:    [facility] [address] [place] [address] [phone]  RESPONSIBLE PARTY:    Contact Information     Name Relation Home Work [place] Daughter   [phone]     I met face to face with patient in facility. Palliative Care was asked to follow this patient by consultation request of  Melissa Mcdonald to address advance care planning and complex medical decision making. This is a follow up visit.                                   ASSESSMENT AND PLAN / RECOMMENDATIONS:  Symptom Management/Plan: 1. ACP: Medical goals to focus on DNR   2. Edema/shortness of breath stable, secondary to pulmonary HTN, CHF/COPD monitor weights, edema   3. Debility, obesity, reviewed weights, discussed nutrition with Melissa Mcdonald, appetite, weights, mobility 06/06/2022 weight 220.6 lbs  07/06/2022 weight 221.1 lbs 4. Palliative care encounter/ Palliative medicine team will continue to support patient, patient's family, and medical team. Visit consisted of counseling and education dealing with the complex and emotionally intense issues of symptom management and palliative care in the setting of serious and potentially life-threatening illness   Follow up Palliative Care Visit: Palliative care will continue to follow for complex medical decision making, advance care planning, and clarification of goals. Return 4 to 8 weeks or prn.   I spent 47 minutes providing this consultation. More than 50% of the time in this consultation was spent in counseling and care coordination. PPS: 40%   Chief Complaint: Follow up palliative consult for complex medical decision making   HISTORY OF PRESENT  ILLNESS:  [name] is a 82 y.o. year old female  with multiple medical problems including Pulmonary hypertension, diastolic congestive heart failure, COPD, chronic hypoxic respiratory failure, chronic kidney disease, follicular lymphoma nodes of head, face, neck, obstructive sleep apnea, morbid obesity, diabetes, gout, vitamin D deficiency, vitamin B12 deficiency, anemia, single non-toxic thyroid nodule, polyneuropathy, vascular disease, anemia, h/o left breast cancer s/p lumpectomy/mammosite in 2014  T1c, N1 (intramammary node), ER/ PR positive, Her 2 neu not over expressing. Wide excision, SLN biopsy, partial breast radiation.   Melissa. Mcdonald continues to reside at [facility] [place]. Melissa. Mcdonald does require were left for transfers. Melissa. Mcdonald is able to get up in the wheelchair, being mobile with her upper extremities going where she wishes at the facility. Melissa. Mcdonald does require staff assistance for bathing, dressing, toileting. Melissa. Mcdonald is able to feed herself. Appetite has been good. Staff endorses no recent falls, hospitalizations. Purpose of today PC f/u visit further discussion monitor trends of appetite, weights, monitor for functional, cognitive decline with chronic disease progression, assess any active symptoms, supportive role. At present Melissa Mcdonald is sitting up in bed, working on needle point. We talked about the needle point she has completed and current projects. Melissa Mcdonald appears comfortable, no visitors present. We talked about ros, functional abilities, projects she has been doing. We talked about her pain at length with recent medication started. We talked about importance of oob, debility, self independence.  We talked about Melissa Mcdonald recent visit with Oncology; watchful waiting to continue yearly mammogram with physical exam, no further biopsy was needed at that time. We talked about her thoughts of what she had  been through with disease progression, moving from [facility] to [facility]. We talked about no recent episodes of sob, wheezing or cough. Melissa Mcdonald endorses she is happy with her facility, staff is very nice to her and she enjoys her room. Melissa Mcdonald was cooperative with assessment, therapeutic listening, emotional support provided, questions answered. Medical goals, poc, medications reviewed. Most visit supportive. Will contact dtg for update on pc visit. PC f/u visit further discussion monitor trends of appetite, weights, monitor for functional, cognitive decline with chronic disease progression, assess any active symptoms, supportive role.    History obtained from review of EMR, discussion with facility staff and  Melissa. Mcdonald.  I reviewed available labs, medications, imaging, studies and related documents from the EMR.  Records reviewed and summarized above.  Physical Exam: General: obese, debilitated, pleasant female EYES: lids intact ENMT: oral mucous membranes moist CV: S1S2, RRR Pulmonary: LCTA, no increased work of breathing, no cough MSK: bed-bound; w/c dependent; left aka Neuro:  + generalized weakness,  +mild cognitive impairment Psych: A and O x 3 Thank you for the opportunity to participate in the care of Melissa. Mcdonald. Please call our office at [phone] if we can be of additional assistance.   Dartanyon Frankowski Ihor Gully, NP

## 2022-09-20 ENCOUNTER — Non-Acute Institutional Stay: Payer: Medicare Other | Admitting: Nurse Practitioner

## 2022-09-20 ENCOUNTER — Encounter: Payer: Self-pay | Admitting: Nurse Practitioner

## 2022-09-20 DIAGNOSIS — Z515 Encounter for palliative care: Secondary | ICD-10-CM

## 2022-09-20 DIAGNOSIS — R5381 Other malaise: Secondary | ICD-10-CM

## 2022-09-20 DIAGNOSIS — R0602 Shortness of breath: Secondary | ICD-10-CM

## 2022-09-20 DIAGNOSIS — I272 Pulmonary hypertension, unspecified: Secondary | ICD-10-CM

## 2022-09-20 NOTE — Progress Notes (Signed)
Therapist, nutritional Palliative Care Consult Note Telephone: [phone]  Fax: [phone]    Date of encounter: 09/20/22 3:38 PM PATIENT NAME: Melissa Mcdonald [address] [place] [address]   [phone] (home)  DOB: Apr 28, 1941 MRN: [id number] PRIMARY CARE PROVIDER:    [facility]  RESPONSIBLE PARTY:    Contact Information     Name Relation Home Work Mobile   [place] Daughter   [phone]     I met face to face with patient in facility. Palliative Care was asked to follow this patient by consultation request of  [facility] to address advance care planning and complex medical decision making. This is a follow up visit.                                   ASSESSMENT AND PLAN / RECOMMENDATIONS:  Symptom Management/Plan: 1. ACP: Medical goals to focus on DNR   2. Edema/shortness of breath stable, secondary to pulmonary HTN, CHF/COPD monitor weights, edema   3. Debility, obesity, reviewed weights, discussed nutrition with Ms Reznick, appetite, weights, mobility 06/06/2022 weight 220.6 lbs 07/06/2022 weight 221.1 lbs] 08/04/2022 weight 216 lbs 4. Palliative care encounter/ Palliative medicine team will continue to support patient, patient's family, and medical team. Visit consisted of counseling and education dealing with the complex and emotionally intense issues of symptom management and palliative care in the setting of serious and potentially life-threatening illness   Follow up Palliative Care Visit: Palliative care will continue to follow for complex medical decision making, advance care planning, and clarification of goals. Return 4 to 8 weeks or prn.   I spent 45 minutes providing this consultation. More than 50% of the time in this consultation was spent in counseling and care coordination. PPS: 40%   Chief Complaint: Follow up palliative consult for complex medical decision making   HISTORY OF PRESENT ILLNESS:  ROSSELYN Mcdonald is a 82 y.o. year old female  with multiple medical problems including Pulmonary hypertension, diastolic congestive heart failure, COPD, chronic hypoxic respiratory failure, chronic kidney disease, follicular lymphoma nodes of head, face, neck, obstructive sleep apnea, morbid obesity, diabetes, gout, vitamin D deficiency, vitamin B12 deficiency, anemia, single non-toxic thyroid nodule, polyneuropathy, vascular disease, anemia, h/o left breast cancer s/p lumpectomy/mammosite in 2014  T1c, N1 (intramammary node), ER/ PR positive, Her 2 neu not over expressing. Wide excision, SLN biopsy, partial breast radiation.    Ms. Gienger continues to reside at [facility]. Ms. Fahey does require were left for transfers. Ms. Sobocinski is able to get up in the wheelchair, being mobile with her upper extremities going where she wishes at the facility. Ms. Difrancesco does require staff assistance for bathing, dressing, toileting. Ms. Qazi is able to feed herself. Appetite has been good. Staff endorses no recent falls, hospitalizations. Purpose of today PC f/u visit further discussion monitor trends of appetite, weights, monitor for functional, cognitive decline with chronic disease progression, assess any active symptoms, supportive role. At present Ms Tiner is sitting up in bed, appears comfortable.    History obtained from review of EMR, discussion with facility staff and  Ms. Krukowski.  I reviewed available labs, medications, imaging, studies and related documents from the EMR.  Records reviewed and summarized above.  Physical Exam: General: obese, debilitated, pleasant female CV: S1S2, RRR Pulmonary: LCTA, no increased work of breathing,  no cough MSK: bed-bound; w/c dependent; left aka Neuro:  + generalized weakness,  +mild cognitive impairment, more forgetful, more difficulty with speech Psych: A and O x 3 Thank you for the opportunity to  participate in the care of Melissa Mcdonald. Please call our office at [phone] if we can be of additional assistance.   Najeeb Uptain Prince [place], NP

## 2022-11-08 ENCOUNTER — Encounter: Payer: Self-pay | Admitting: Nurse Practitioner

## 2022-11-08 ENCOUNTER — Non-Acute Institutional Stay: Payer: Medicare Other | Admitting: Nurse Practitioner

## 2022-11-08 DIAGNOSIS — R5381 Other malaise: Secondary | ICD-10-CM

## 2022-11-08 DIAGNOSIS — Z515 Encounter for palliative care: Secondary | ICD-10-CM

## 2022-11-08 DIAGNOSIS — R0602 Shortness of breath: Secondary | ICD-10-CM

## 2022-11-08 DIAGNOSIS — I272 Pulmonary hypertension, unspecified: Secondary | ICD-10-CM

## 2022-11-08 NOTE — Progress Notes (Signed)
Therapist, nutritional Palliative Care Consult Note Telephone: [phone]  Fax: [phone]    Date of encounter: 11/08/22 9:09 PM PATIENT NAME: Melissa Mcdonald [address] [place] [address]   [phone] (home)  DOB: Sep 24, 1940 MRN: [id number] PRIMARY CARE PROVIDER:    [facility]  RESPONSIBLE PARTY:    Contact Information     Name Relation Home Work Mobile   [place] Daughter   [phone]        I met face to face with patient in facility. Palliative Care was asked to follow this patient by consultation request of  [facility] to address advance care planning and complex medical decision making. This is a follow up visit.                                   ASSESSMENT AND PLAN / RECOMMENDATIONS:  Symptom Management/Plan: 1. ACP: Medical goals to focus on DNR   2. Edema/shortness of breath stable, secondary to pulmonary HTN, CHF/COPD monitor weights, edema   3. Debility, obesity, reviewed weights, discussed nutrition with Melissa Mcdonald, appetite, weights, mobility 06/06/2022 weight 220.6 lbs 07/06/2022 weight 221.1 lbs] 08/04/2022 weight 216 lbs Current weight not available  4. Palliative care encounter/ Palliative medicine team will continue to support patient, patient's family, and medical team. Visit consisted of counseling and education dealing with the complex and emotionally intense issues of symptom management and palliative care in the setting of serious and potentially life-threatening illness   Follow up Palliative Care Visit: Palliative care will continue to follow for complex medical decision making, advance care planning, and clarification of goals. Return 2 to 8 weeks or prn.   I spent 47 minutes providing this consultation. More than 50% of the time in this consultation was spent in counseling and care coordination. PPS: 40%   Chief Complaint: Follow up palliative consult for complex medical decision making    HISTORY OF PRESENT ILLNESS:  Melissa Mcdonald is a 82 y.o. year old female  with multiple medical problems including Pulmonary hypertension, diastolic congestive heart failure, COPD, chronic hypoxic respiratory failure, chronic kidney disease, follicular lymphoma nodes of head, face, neck, obstructive sleep apnea, morbid obesity, diabetes, gout, vitamin D deficiency, vitamin B12 deficiency, anemia, single non-toxic thyroid nodule, polyneuropathy, vascular disease, anemia, h/o left breast cancer s/p lumpectomy/mammosite in 2014  T1c, N1 (intramammary node), ER/ PR positive, Her 2 neu not over expressing. Wide excision, SLN biopsy, partial breast radiation.    Melissa. Mcdonald continues to reside at [facility]. Melissa Mcdonald does require were left for transfers. Melissa Mcdonald is able to get up in the wheelchair, being mobile with her upper extremities going where she wishes at the facility. Melissa Mcdonald does require staff assistance for bathing, dressing, toileting. Melissa Mcdonald is able to feed herself. Staff endorses no recent falls, hospitalizations. Purpose of today PC f/u visit further discussion monitor trends of appetite, weights, monitor for functional, cognitive decline with chronic disease progression, assess any active symptoms, supportive role. At present Melissa Mcdonald is sitting up in bed, appears comfortable.    History obtained from review of EMR, discussion with facility staff and  Melissa Mcdonald.  I reviewed available labs, medications, imaging, studies and related documents from the EMR.  Records reviewed and summarized above.  Physical Exam: General: obese, debilitated, pleasant female CV: S1S2, RRR Pulmonary: LCTA,  no increased work of breathing, no cough MSK: bed-bound; w/c dependent; left aka Neuro:  + generalized weakness,  +mild cognitive impairment, more forgetful, more difficulty with speech Thank you for the opportunity to  participate in the care of Melissa Mcdonald. Please call our office at [phone] if we can be of additional assistance.   Isay Perleberg Prince [place], NP

## 2023-05-10 ENCOUNTER — Other Ambulatory Visit: Payer: Self-pay

## 2023-05-10 ENCOUNTER — Emergency Department: Payer: Medicare Other

## 2023-05-10 ENCOUNTER — Inpatient Hospital Stay
Admission: EM | Admit: 2023-05-10 | Discharge: 2023-05-15 | DRG: 551 | Disposition: A | Discharge status: Skilled Nursing Facility | Payer: Medicare Other | Source: Skilled Nursing Facility | Attending: Internal Medicine | Admitting: Internal Medicine

## 2023-05-10 DIAGNOSIS — Z79899 Other long term (current) drug therapy: Secondary | ICD-10-CM

## 2023-05-10 DIAGNOSIS — Z923 Personal history of irradiation: Secondary | ICD-10-CM

## 2023-05-10 DIAGNOSIS — I10 Essential (primary) hypertension: Secondary | ICD-10-CM | POA: Diagnosis present

## 2023-05-10 DIAGNOSIS — Z853 Personal history of malignant neoplasm of breast: Secondary | ICD-10-CM

## 2023-05-10 DIAGNOSIS — M545 Low back pain, unspecified: Principal | ICD-10-CM

## 2023-05-10 DIAGNOSIS — N179 Acute kidney failure, unspecified: Secondary | ICD-10-CM

## 2023-05-10 DIAGNOSIS — M48061 Spinal stenosis, lumbar region without neurogenic claudication: Secondary | ICD-10-CM | POA: Diagnosis not present

## 2023-05-10 DIAGNOSIS — Z888 Allergy status to other drugs, medicaments and biological substances status: Secondary | ICD-10-CM

## 2023-05-10 DIAGNOSIS — Z85828 Personal history of other malignant neoplasm of skin: Secondary | ICD-10-CM

## 2023-05-10 DIAGNOSIS — Z96651 Presence of right artificial knee joint: Secondary | ICD-10-CM | POA: Diagnosis present

## 2023-05-10 DIAGNOSIS — Z515 Encounter for palliative care: Secondary | ICD-10-CM

## 2023-05-10 DIAGNOSIS — Z7401 Bed confinement status: Secondary | ICD-10-CM

## 2023-05-10 DIAGNOSIS — Z794 Long term (current) use of insulin: Secondary | ICD-10-CM

## 2023-05-10 DIAGNOSIS — M549 Dorsalgia, unspecified: Secondary | ICD-10-CM | POA: Diagnosis present

## 2023-05-10 DIAGNOSIS — Z823 Family history of stroke: Secondary | ICD-10-CM

## 2023-05-10 DIAGNOSIS — Z7984 Long term (current) use of oral hypoglycemic drugs: Secondary | ICD-10-CM

## 2023-05-10 DIAGNOSIS — Z89612 Acquired absence of left leg above knee: Secondary | ICD-10-CM

## 2023-05-10 DIAGNOSIS — G4733 Obstructive sleep apnea (adult) (pediatric): Secondary | ICD-10-CM | POA: Diagnosis present

## 2023-05-10 DIAGNOSIS — E16A1 Hypoglycemia level 1: Secondary | ICD-10-CM | POA: Diagnosis not present

## 2023-05-10 DIAGNOSIS — Z741 Need for assistance with personal care: Secondary | ICD-10-CM | POA: Diagnosis present

## 2023-05-10 DIAGNOSIS — E119 Type 2 diabetes mellitus without complications: Secondary | ICD-10-CM

## 2023-05-10 DIAGNOSIS — Z9841 Cataract extraction status, right eye: Secondary | ICD-10-CM

## 2023-05-10 DIAGNOSIS — J449 Chronic obstructive pulmonary disease, unspecified: Secondary | ICD-10-CM | POA: Diagnosis present

## 2023-05-10 DIAGNOSIS — Z8249 Family history of ischemic heart disease and other diseases of the circulatory system: Secondary | ICD-10-CM

## 2023-05-10 DIAGNOSIS — Z66 Do not resuscitate: Secondary | ICD-10-CM | POA: Diagnosis present

## 2023-05-10 DIAGNOSIS — C8591 Non-Hodgkin lymphoma, unspecified, lymph nodes of head, face, and neck: Secondary | ICD-10-CM | POA: Diagnosis present

## 2023-05-10 DIAGNOSIS — M47816 Spondylosis without myelopathy or radiculopathy, lumbar region: Secondary | ICD-10-CM | POA: Diagnosis present

## 2023-05-10 DIAGNOSIS — K5900 Constipation, unspecified: Secondary | ICD-10-CM | POA: Diagnosis not present

## 2023-05-10 DIAGNOSIS — E1165 Type 2 diabetes mellitus with hyperglycemia: Secondary | ICD-10-CM | POA: Diagnosis not present

## 2023-05-10 DIAGNOSIS — Z6823 Body mass index (BMI) 23.0-23.9, adult: Secondary | ICD-10-CM

## 2023-05-10 DIAGNOSIS — I2721 Secondary pulmonary arterial hypertension: Secondary | ICD-10-CM

## 2023-05-10 DIAGNOSIS — G928 Other toxic encephalopathy: Secondary | ICD-10-CM | POA: Diagnosis present

## 2023-05-10 DIAGNOSIS — I5032 Chronic diastolic (congestive) heart failure: Secondary | ICD-10-CM | POA: Diagnosis present

## 2023-05-10 DIAGNOSIS — Z89619 Acquired absence of unspecified leg above knee: Secondary | ICD-10-CM

## 2023-05-10 DIAGNOSIS — N1831 Chronic kidney disease, stage 3a: Secondary | ICD-10-CM | POA: Diagnosis present

## 2023-05-10 DIAGNOSIS — Z993 Dependence on wheelchair: Secondary | ICD-10-CM

## 2023-05-10 DIAGNOSIS — Z9842 Cataract extraction status, left eye: Secondary | ICD-10-CM

## 2023-05-10 DIAGNOSIS — Z79811 Long term (current) use of aromatase inhibitors: Secondary | ICD-10-CM

## 2023-05-10 DIAGNOSIS — I130 Hypertensive heart and chronic kidney disease with heart failure and stage 1 through stage 4 chronic kidney disease, or unspecified chronic kidney disease: Secondary | ICD-10-CM | POA: Diagnosis present

## 2023-05-10 DIAGNOSIS — F419 Anxiety disorder, unspecified: Secondary | ICD-10-CM | POA: Diagnosis present

## 2023-05-10 DIAGNOSIS — E11649 Type 2 diabetes mellitus with hypoglycemia without coma: Secondary | ICD-10-CM | POA: Diagnosis not present

## 2023-05-10 DIAGNOSIS — C8511 Unspecified B-cell lymphoma, lymph nodes of head, face, and neck: Secondary | ICD-10-CM | POA: Diagnosis present

## 2023-05-10 DIAGNOSIS — E669 Obesity, unspecified: Secondary | ICD-10-CM | POA: Diagnosis present

## 2023-05-10 DIAGNOSIS — Z7951 Long term (current) use of inhaled steroids: Secondary | ICD-10-CM

## 2023-05-10 DIAGNOSIS — J9611 Chronic respiratory failure with hypoxia: Secondary | ICD-10-CM | POA: Insufficient documentation

## 2023-05-10 DIAGNOSIS — R52 Pain, unspecified: Secondary | ICD-10-CM

## 2023-05-10 DIAGNOSIS — J984 Other disorders of lung: Secondary | ICD-10-CM | POA: Diagnosis present

## 2023-05-10 DIAGNOSIS — E1122 Type 2 diabetes mellitus with diabetic chronic kidney disease: Secondary | ICD-10-CM | POA: Diagnosis present

## 2023-05-10 DIAGNOSIS — M109 Gout, unspecified: Secondary | ICD-10-CM | POA: Diagnosis present

## 2023-05-10 LAB — COMPREHENSIVE METABOLIC PANEL
ALT: 15 U/L (ref 0–44)
AST: 17 U/L (ref 15–41)
Albumin: 3.6 g/dL (ref 3.5–5.0)
Alkaline Phosphatase: 74 U/L (ref 38–126)
Anion gap: 11 (ref 5–15)
BUN: 26 mg/dL — ABNORMAL HIGH (ref 8–23)
CO2: 29 mmol/L (ref 22–32)
Calcium: 9 mg/dL (ref 8.9–10.3)
Chloride: 101 mmol/L (ref 98–111)
Creatinine, Ser: 1.45 mg/dL — ABNORMAL HIGH (ref 0.44–1.00)
GFR, Estimated: 36 mL/min — ABNORMAL LOW (ref >60)
Glucose, Bld: 115 mg/dL — ABNORMAL HIGH (ref 70–99)
Potassium: 3.9 mmol/L (ref 3.5–5.1)
Sodium: 141 mmol/L (ref 135–145)
Total Bilirubin: 0.8 mg/dL (ref <1.2)
Total Protein: 7.1 g/dL (ref 6.5–8.1)

## 2023-05-10 LAB — CBC WITH DIFFERENTIAL/PLATELET
Abs Immature Granulocytes: 0.02 10*3/uL (ref 0.00–0.07)
Basophils Absolute: 0 10*3/uL (ref 0.0–0.1)
Basophils Relative: 0 %
Eosinophils Absolute: 0.5 10*3/uL (ref 0.0–0.5)
Eosinophils Relative: 4 %
HCT: 38.7 % (ref 36.0–46.0)
Hemoglobin: 12.7 g/dL (ref 12.0–15.0)
Immature Granulocytes: 0 %
Lymphocytes Relative: 11 %
Lymphs Abs: 1.2 10*3/uL (ref 0.7–4.0)
MCH: 32.6 pg (ref 26.0–34.0)
MCHC: 32.8 g/dL (ref 30.0–36.0)
MCV: 99.5 fL (ref 80.0–100.0)
Monocytes Absolute: 0.6 10*3/uL (ref 0.1–1.0)
Monocytes Relative: 6 %
Neutro Abs: 8.7 10*3/uL — ABNORMAL HIGH (ref 1.7–7.7)
Neutrophils Relative %: 79 %
Platelets: 217 10*3/uL (ref 150–400)
RBC: 3.89 MIL/uL (ref 3.87–5.11)
RDW: 14.2 % (ref 11.5–15.5)
WBC: 11 10*3/uL — ABNORMAL HIGH (ref 4.0–10.5)
nRBC: 0 % (ref 0.0–0.2)

## 2023-05-10 LAB — LIPASE, BLOOD: Lipase: 28 U/L (ref 11–51)

## 2023-05-10 MED ORDER — IOHEXOL 300 MG/ML  SOLN
75.0000 mL | Freq: Once | INTRAMUSCULAR | Status: AC | PRN
  Administered 2023-05-10: 75 mL via INTRAVENOUS

## 2023-05-10 MED ORDER — LIDOCAINE 5 % EX PTCH
1.0000 | MEDICATED_PATCH | CUTANEOUS | Status: DC
  Administered 2023-05-10 – 2023-05-14 (×5): 1 via TRANSDERMAL
  Filled 2023-05-10 (×5): qty 1

## 2023-05-10 MED ORDER — ONDANSETRON HCL 4 MG/2ML IJ SOLN
4.0000 mg | Freq: Once | INTRAMUSCULAR | Status: AC
  Administered 2023-05-10: 4 mg via INTRAVENOUS
  Filled 2023-05-10: qty 2

## 2023-05-10 MED ORDER — HYDROMORPHONE HCL 1 MG/ML IJ SOLN
0.5000 mg | Freq: Once | INTRAMUSCULAR | Status: AC
  Administered 2023-05-10: 0.5 mg via INTRAVENOUS
  Filled 2023-05-10: qty 0.5

## 2023-05-10 NOTE — ED Notes (Signed)
Pt requesting "stronger pain medication," per family at bedside. Pt relates no relief from lidocaine patch. Dr Fuller Plan informed and placing orders.

## 2023-05-10 NOTE — ED Triage Notes (Addendum)
Pt in via [facility] for severe back pain and SI. Xray done today and found bulging disc and degenerative narrowing per EMS. Per EMS pt wanted to leave facility and couldn't and that is when pt told staff she was suicidal. Pt denies SI at this time. Per pt she states "I told them I was suicidal for attention." Pt is from [facility].  Vitals per EMS: HR-60 99% RA 148/64

## 2023-05-10 NOTE — ED Provider Notes (Addendum)
[facility] Provider Note    Event Date/Time   First MD Initiated Contact with Patient 05/10/23 1958     (approximate)   History   Back Pain   HPI  Melissa Mcdonald is a 82 y.o. female who comes in with severe back pain and SI.  Patient was found to have a bulging disc on x-ray patient wanted to leave the facility when they were told she could not she reportedly had SI.  Patient's daughter is at bedside.  She reports that she was complaining of severe lower back pain.  They deny any new falls.  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  She reportedly did take a couple doses of pain medication at the facility but the patient was frustrated that they would not evaluate her and she reportedly stated that she wanted to die.  According to the daughters at bedside this is very common phrase for the patient to state.  They have no concerns that patient is actually going to hurt herself but it was more just that she was frustrated about to the back pain.  She continues to deny SI at this point and she states that she was frustrated about her back hurting.  Physical Exam   Triage Vital Signs:Blood pressure 126/69, pulse 62, temperature 98.1 F (36.7 C), temperature source Oral, resp. rate 18, height 5' (1.524 m), weight 54 kg, SpO2 96%.   Most recent vital signs: Vitals:   05/10/23 2021  BP: 126/69  Pulse: 62  Resp: 18  Temp: 98.1 F (36.7 C)  SpO2: 96%     General: Awake, no distress.  CV:  Good peripheral perfusion.  Resp:  Normal effort.  Abd:  No distention.  Other:  Elevated BMI.  Amputation on the left.  Right leg with missing the bones but warm and well-perfused.   ED Results / Procedures / Treatments   Labs (all labs ordered are listed, but only abnormal results are displayed) Labs Reviewed  CBC WITH DIFFERENTIAL/PLATELET  COMPREHENSIVE  METABOLIC PANEL  LIPASE, BLOOD  URINALYSIS, ROUTINE W REFLEX MICROSCOPIC      RADIOLOGY I reviewed the CT scan personally interpreted and no obvious kidney stones PROCEDURES:  Critical Care performed: No  Procedures   MEDICATIONS ORDERED IN ED: Medications  lidocaine (LIDODERM) 5 % 1 patch (1 patch Transdermal Patch Applied 05/10/23 2059)  HYDROmorphone (DILAUDID) injection 0.5 mg (has no administration in time range)  ondansetron (ZOFRAN) injection 4 mg (has no administration in time range)  iohexol (OMNIPAQUE) 300 MG/ML solution 75 mL (75 mLs Intravenous Contrast Given 05/10/23 2213)     IMPRESSION / MDM / ASSESSMENT AND PLAN / ED COURSE  I reviewed the triage vital signs and the nursing notes.   Patient's presentation is most consistent with acute presentation with potential threat to life or bodily function.   Patient comes in with severe lower back pain.  Lidocaine patch was ordered.  CT imaging ordered evaluate for fractures, dislocation.'s difficult to get a strength or numbness assessment given she is nonambulatory at baseline.  We discussed psych nvolvement but family have declined.  The patient just states that she always says this current stuff when she is frustrated and in pain.  She is not actually having any SI.  I also discussed with patient's daughter who collaborates the story.  She does not feel like psychiatric  needs to be involved and given this seems to be all related to her pain I agree.  Do not feel patient is a danger to herself or to others.  I discussed with daughter that if she did something to hurt herself at the facility that I would feel liable and terrible but the daughter again states that her mom always says things like that when she is uncomfortable and they really do not have any concerns of self-harm.  Therefore we will clear patient I do not feel if she needs a psychiatric evaluation.  They report that patient is otherwise at her baseline  self.  Lipase normal.  CBC slightly elevated white count CMP slightly elevated creatinine.  Patient handed off pending CT imaging.  Patient did report some increasing pain so was given a dose of IV pain medication while awaiting CT results  CT is overall reassuring.  She is got no right upper quadrant pain to suggest gallstones.  Did alert family of this finding.  She reports it is all in her lower back.  Denies any thoracic pain.  We discussed her limited neurological exam and pros and cons of MRI they would like to proceed with MRI.  Patient handed off pending MRI, UA    FINAL CLINICAL IMPRESSION(S) / ED DIAGNOSES   Final diagnoses:  Low back pain, unspecified back pain laterality, unspecified chronicity, unspecified whether sciatica present     Rx / DC Orders   ED Discharge Orders     None        Note:  This document was prepared using Dragon voice recognition software and may include unintentional dictation errors.    Concha Se, MD 05/11/23 Marlyne Beards

## 2023-05-11 ENCOUNTER — Emergency Department: Payer: Medicare Other

## 2023-05-11 DIAGNOSIS — M48061 Spinal stenosis, lumbar region without neurogenic claudication: Secondary | ICD-10-CM | POA: Diagnosis present

## 2023-05-11 DIAGNOSIS — E119 Type 2 diabetes mellitus without complications: Secondary | ICD-10-CM

## 2023-05-11 DIAGNOSIS — J449 Chronic obstructive pulmonary disease, unspecified: Secondary | ICD-10-CM | POA: Diagnosis present

## 2023-05-11 DIAGNOSIS — Z89619 Acquired absence of unspecified leg above knee: Secondary | ICD-10-CM

## 2023-05-11 DIAGNOSIS — J9611 Chronic respiratory failure with hypoxia: Secondary | ICD-10-CM | POA: Insufficient documentation

## 2023-05-11 DIAGNOSIS — M549 Dorsalgia, unspecified: Secondary | ICD-10-CM | POA: Diagnosis not present

## 2023-05-11 DIAGNOSIS — I5043 Acute on chronic combined systolic (congestive) and diastolic (congestive) heart failure: Secondary | ICD-10-CM | POA: Insufficient documentation

## 2023-05-11 DIAGNOSIS — R52 Pain, unspecified: Secondary | ICD-10-CM

## 2023-05-11 DIAGNOSIS — Z853 Personal history of malignant neoplasm of breast: Secondary | ICD-10-CM

## 2023-05-11 DIAGNOSIS — M545 Low back pain, unspecified: Secondary | ICD-10-CM

## 2023-05-11 LAB — URINALYSIS, ROUTINE W REFLEX MICROSCOPIC
Bacteria, UA: NONE SEEN
Bilirubin Urine: NEGATIVE
Glucose, UA: >=500 mg/dL — AB
Hgb urine dipstick: NEGATIVE
Ketones, ur: NEGATIVE mg/dL
Leukocytes,Ua: NEGATIVE
Nitrite: NEGATIVE
Protein, ur: NEGATIVE mg/dL
Specific Gravity, Urine: 1.033 — ABNORMAL HIGH (ref 1.005–1.030)
pH: 6 (ref 5.0–8.0)

## 2023-05-11 LAB — HEMOGLOBIN A1C
Hgb A1c MFr Bld: 6 % — ABNORMAL HIGH (ref 4.8–5.6)
Mean Plasma Glucose: 125.5 mg/dL

## 2023-05-11 LAB — CBG MONITORING, ED
Glucose-Capillary: 111 mg/dL — ABNORMAL HIGH (ref 70–99)
Glucose-Capillary: 103 mg/dL — ABNORMAL HIGH (ref 70–99)
Glucose-Capillary: 101 mg/dL — ABNORMAL HIGH (ref 70–99)
Glucose-Capillary: 84 mg/dL (ref 70–99)

## 2023-05-11 MED ORDER — GADOBUTROL 1 MMOL/ML IV SOLN
10.0000 mL | Freq: Once | INTRAVENOUS | Status: AC | PRN
  Administered 2023-05-11: 10 mL via INTRAVENOUS

## 2023-05-11 MED ORDER — ONDANSETRON HCL 4 MG/2ML IJ SOLN: 4.0000 mg | Freq: Four times a day (QID) | INTRAMUSCULAR | Status: DC | PRN

## 2023-05-11 MED ORDER — METHOCARBAMOL 1000 MG/10ML IJ SOLN: 500.0000 mg | Freq: Four times a day (QID) | INTRAMUSCULAR | Status: DC | PRN

## 2023-05-11 MED ORDER — INSULIN ASPART 100 UNIT/ML IJ SOLN
0.0000 [IU] | Freq: Three times a day (TID) | INTRAMUSCULAR | Status: DC
  Administered 2023-05-13: 2 [IU] via SUBCUTANEOUS
  Administered 2023-05-13: 3 [IU] via SUBCUTANEOUS
  Filled 2023-05-11 (×2): qty 1

## 2023-05-11 MED ORDER — LETROZOLE 2.5 MG PO TABS
2.5000 mg | ORAL_TABLET | Freq: Every day | ORAL | Status: DC
  Administered 2023-05-11 – 2023-05-15 (×5): 2.5 mg via ORAL
  Filled 2023-05-11 (×5): qty 1

## 2023-05-11 MED ORDER — HYDROMORPHONE HCL 1 MG/ML IJ SOLN
0.5000 mg | Freq: Once | INTRAMUSCULAR | Status: AC
  Administered 2023-05-11: 0.5 mg via INTRAVENOUS
  Filled 2023-05-11: qty 0.5

## 2023-05-11 MED ORDER — GABAPENTIN 300 MG PO CAPS
300.0000 mg | ORAL_CAPSULE | Freq: Four times a day (QID) | ORAL | Status: DC
  Administered 2023-05-11 – 2023-05-13 (×7): 300 mg via ORAL
  Filled 2023-05-11 (×8): qty 1

## 2023-05-11 MED ORDER — ALPRAZOLAM 0.25 MG PO TABS
0.2500 mg | ORAL_TABLET | Freq: Two times a day (BID) | ORAL | Status: DC | PRN
  Administered 2023-05-11: 0.25 mg via ORAL
  Filled 2023-05-11 (×2): qty 1

## 2023-05-11 MED ORDER — ONDANSETRON HCL 4 MG/2ML IJ SOLN
4.0000 mg | Freq: Once | INTRAMUSCULAR | Status: AC
  Administered 2023-05-11: 4 mg via INTRAVENOUS
  Filled 2023-05-11: qty 2

## 2023-05-11 MED ORDER — SODIUM CHLORIDE 0.9 % IV BOLUS
1000.0000 mL | Freq: Once | INTRAVENOUS | Status: AC
  Administered 2023-05-11: 1000 mL via INTRAVENOUS

## 2023-05-11 MED ORDER — HYDROCODONE-ACETAMINOPHEN 5-325 MG PO TABS
2.0000 | ORAL_TABLET | ORAL | Status: DC | PRN
  Administered 2023-05-11 – 2023-05-14 (×4): 2 via ORAL
  Filled 2023-05-11 (×5): qty 2

## 2023-05-11 MED ORDER — PREDNISONE 50 MG PO TABS
50.0000 mg | ORAL_TABLET | Freq: Every day | ORAL | Status: DC
  Administered 2023-05-13: 50 mg via ORAL
  Filled 2023-05-11 (×2): qty 1

## 2023-05-11 MED ORDER — MOMETASONE FURO-FORMOTEROL FUM 200-5 MCG/ACT IN AERO
2.0000 | INHALATION_SPRAY | Freq: Two times a day (BID) | RESPIRATORY_TRACT | Status: DC
  Administered 2023-05-11 – 2023-05-15 (×6): 2 via RESPIRATORY_TRACT
  Filled 2023-05-11: qty 8.8

## 2023-05-11 MED ORDER — ENOXAPARIN SODIUM 30 MG/0.3ML IJ SOSY
30.0000 mg | PREFILLED_SYRINGE | INTRAMUSCULAR | Status: DC
  Administered 2023-05-11 – 2023-05-15 (×5): 30 mg via SUBCUTANEOUS
  Filled 2023-05-11 (×5): qty 0.3

## 2023-05-11 MED ORDER — ONDANSETRON HCL 4 MG PO TABS: 4.0000 mg | ORAL_TABLET | Freq: Four times a day (QID) | ORAL | Status: DC | PRN

## 2023-05-11 MED ORDER — IPRATROPIUM-ALBUTEROL 0.5-2.5 (3) MG/3ML IN SOLN: 3.0000 mL | RESPIRATORY_TRACT | Status: DC | PRN

## 2023-05-11 MED ORDER — INSULIN GLARGINE-YFGN 100 UNIT/ML ~~LOC~~ SOLN
10.0000 [IU] | Freq: Every day | SUBCUTANEOUS | Status: DC
  Administered 2023-05-11 – 2023-05-15 (×5): 10 [IU] via SUBCUTANEOUS
  Filled 2023-05-11 (×5): qty 0.1

## 2023-05-11 MED ORDER — INSULIN ASPART 100 UNIT/ML IJ SOLN: 0.0000 [IU] | Freq: Every day | INTRAMUSCULAR | Status: DC

## 2023-05-11 MED ORDER — METOPROLOL TARTRATE 50 MG PO TABS
50.0000 mg | ORAL_TABLET | Freq: Every day | ORAL | Status: DC
  Administered 2023-05-11 – 2023-05-15 (×4): 50 mg via ORAL
  Filled 2023-05-11 (×5): qty 1

## 2023-05-11 MED ORDER — LACTATED RINGERS IV BOLUS
500.0000 mL | Freq: Once | INTRAVENOUS | Status: AC
  Administered 2023-05-11: 500 mL via INTRAVENOUS

## 2023-05-11 MED ORDER — LISINOPRIL 5 MG PO TABS
2.5000 mg | ORAL_TABLET | Freq: Every day | ORAL | Status: DC
  Administered 2023-05-11 – 2023-05-12 (×2): 2.5 mg via ORAL
  Filled 2023-05-11 (×2): qty 1

## 2023-05-11 MED ORDER — ACETAMINOPHEN 325 MG PO TABS: 650.0000 mg | ORAL_TABLET | Freq: Four times a day (QID) | ORAL | Status: DC | PRN

## 2023-05-11 MED ORDER — HYDROMORPHONE HCL 1 MG/ML IJ SOLN
0.5000 mg | INTRAMUSCULAR | Status: DC | PRN
  Administered 2023-05-11: 1 mg via INTRAVENOUS
  Administered 2023-05-12: 0.5 mg via INTRAVENOUS
  Filled 2023-05-11 (×2): qty 1

## 2023-05-11 MED ORDER — ALLOPURINOL 300 MG PO TABS
300.0000 mg | ORAL_TABLET | Freq: Every day | ORAL | Status: DC
  Administered 2023-05-11 – 2023-05-15 (×5): 300 mg via ORAL
  Filled 2023-05-11 (×5): qty 1

## 2023-05-11 MED ORDER — ACETAMINOPHEN 650 MG RE SUPP
650.0000 mg | Freq: Four times a day (QID) | RECTAL | Status: DC | PRN
  Administered 2023-05-12: 650 mg via RECTAL
  Filled 2023-05-11: qty 2

## 2023-05-11 NOTE — ED Notes (Addendum)
Pt cleaned of urine. Pt yelling out for "Britta Mccreedy," states she wants to "get out of here." Verbal reassurance given.

## 2023-05-11 NOTE — ED Notes (Signed)
Pt cleaned of urine at this time. New linens and chux applied, pt changed into hospital gown. Perineum cleaned, barrier cream applied to perineum and fold of pannus. Perineum and sacrum clean, dry, intact. Mepilex dressing applied to sacrum. Pt repositioned in bed, no further needs identified at this time by the pt/family.

## 2023-05-11 NOTE — Assessment & Plan Note (Signed)
Sliding scale insulin coverage

## 2023-05-11 NOTE — Assessment & Plan Note (Addendum)
To follow up as outpatient.

## 2023-05-11 NOTE — ED Notes (Signed)
Dr Para March to bedside to speak with pt and daughter about admission.

## 2023-05-11 NOTE — Assessment & Plan Note (Signed)
Patient non ambulatory, bed bound and wheelchair bound.

## 2023-05-11 NOTE — Assessment & Plan Note (Signed)
-   Continue letrozole 

## 2023-05-11 NOTE — ED Notes (Signed)
IV leaking, dressing changed and secured with keflex wrap.

## 2023-05-11 NOTE — Progress Notes (Addendum)
No charge progress note.  Melissa Mcdonald is a 82 y.o. female with medical history significant for Pulmonary hypertension, diastolic congestive heart failure, COPD, chronic hypoxic respiratory failure, chronic kidney disease, follicular lymphoma  diabetes, gout, left AKA, being admitted for intractable low back pain.   On presentation vital stable, labs with WBC 11,000, creatinine 1.45,slightly up from most recent baseline of 1.05 a year prior.  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 2. Moderate L4-5 facet arthrosis with mild left neural foraminal stenosis.   Patient was treated with Dilaudid and lidocaine patch.  12/6: Vital stable, IR was consulted for epidural spinal injection.  Also started on prednisone for 3 days.  Pain seems bearable with current regimen.  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 At baseline she is mostly wheelchair and bedbound.  -Continue current management Pending PT and OT evaluation. Patient might need an outpatient spinal surgery evaluation after discharge.  IR evaluated her and she is not a candidate for epidural injection as she is not very mobile, obese and unable to lay on her abdomen. They are recommending p.o. steroid and pain management.

## 2023-05-11 NOTE — ED Notes (Signed)
Pt back from radiology with CT tech via stretcher and appears to be asleep.

## 2023-05-11 NOTE — Hospital Course (Addendum)
Taken from H&P.  Melissa Mcdonald is a 82 y.o. female with medical history significant for Pulmonary hypertension, diastolic congestive heart failure, COPD, chronic hypoxic respiratory failure, chronic kidney disease, follicular lymphoma  diabetes, gout, left AKA, being admitted for intractable low back pain.   On presentation vital stable, labs with WBC 11,000, creatinine 1.45,slightly up from most recent baseline of 1.05 a year prior.  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 2. Moderate L4-5 facet arthrosis with mild left neural foraminal stenosis.   Patient was treated with Dilaudid and lidocaine patch.  12/6: Vital stable, IR was consulted for epidural spinal injection.  Also started on prednisone for 3 days.

## 2023-05-11 NOTE — ED Provider Notes (Signed)
-----------------------------------------   2:02 AM on 05/11/2023 -----------------------------------------   MRI interpreted per Dr. Chase Picket:  1. 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  2. Moderate L4-5 facet arthrosis with mild left neural foraminal  stenosis.  3. No spinal canal stenosis.   Patient still in significant pain.  Will redose Dilaudid.  Administer IV fluids for AKI.  Awaiting UA.  Anticipate hospitalization for intractable pain.   ----------------------------------------- 2:28 AM on 05/11/2023 -----------------------------------------   UA negative   Irean Hong, MD 05/11/23 [phone]
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
## 2023-05-11 NOTE — ED Notes (Signed)
Pt's daughter going home to rest and will be back later. Pt restful at this time with eyes closed and respirations regular, even and unlabored. O2 sat 100% on O2 @ 2L via [address].

## 2023-05-11 NOTE — Assessment & Plan Note (Signed)
Spine MRI with severe right L3-4 neural foraminal stenosis.  Moderate L4-5 facet arthrosis with mid left neural foraminal stenosis.  No spinal canal stenosis.  Patient is non ambulatory, with obesity and poor functional status Not candidate for local therapy with steroid injection.   Plan to continue supportive medical therapy with analgesics, IV hydromorphone and oral oxycodone.  With low blood pressure, she carries a poor prognosis.  This was discussed with her daughter at the bedside and she decided to continue care with more focus on palliation.  Patient is DNR Will consult palliative care.

## 2023-05-11 NOTE — ED Notes (Signed)
Patient son called for update,  given brief overview of patient's current condition
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
## 2023-05-11 NOTE — ED Notes (Signed)
Arm board and pressure bag applied to infuse LR.

## 2023-05-11 NOTE — ED Notes (Signed)
Pt's BP 80/42, 80/30- EDP notified and admitting MD notified. Awaiting response.

## 2023-05-11 NOTE — Progress Notes (Signed)
Anticoagulation monitoring(Lovenox):  82 yo female ordered Lovenox 40 mg Q24h    Filed Weights   05/10/23 2008  Weight: 54 kg (119 lb 0.8 oz)   BMI 23.3   Lab Results  Component Value Date   CREATININE 1.45 (H) 05/10/2023   CREATININE 1.05 (H) 02/02/2022   CREATININE 0.82 07/31/2017   Estimated Creatinine Clearance: 21.9 mL/min (A) (by C-G formula based on SCr of 1.45 mg/dL (H)). Hemoglobin & Hematocrit     Component Value Date/Time   HGB 12.7 05/10/2023 2054   HGB 11.0 (L) 09/17/2014 1127   HCT 38.7 05/10/2023 2054   HCT 33.4 (L) 09/17/2014 1127     Per Protocol for Patient with estCrcl < 30 ml/min and BMI < 30, will transition to Lovenox 30 mg Q24h.

## 2023-05-11 NOTE — Assessment & Plan Note (Signed)
Pulmonary arterial hypertension Chronic respiratory failure with hypoxia Continue home inhalers DuoNebs as needed No signs of acute exacerbation.

## 2023-05-11 NOTE — ED Notes (Signed)
RN to bedside to provide breakfast to pt. Pt  provided peri care and repositioned. Pt provided clean brief. Pt sat up in bed to eat breakfast. RN encouraged pt to eat breakfast. Pt took one bite and stated she did not want anymore.

## 2023-05-12 DIAGNOSIS — C8511 Unspecified B-cell lymphoma, lymph nodes of head, face, and neck: Secondary | ICD-10-CM

## 2023-05-12 DIAGNOSIS — N1831 Chronic kidney disease, stage 3a: Secondary | ICD-10-CM | POA: Diagnosis present

## 2023-05-12 DIAGNOSIS — Z8249 Family history of ischemic heart disease and other diseases of the circulatory system: Secondary | ICD-10-CM | POA: Diagnosis not present

## 2023-05-12 DIAGNOSIS — Z89612 Acquired absence of left leg above knee: Secondary | ICD-10-CM | POA: Diagnosis not present

## 2023-05-12 DIAGNOSIS — E1165 Type 2 diabetes mellitus with hyperglycemia: Secondary | ICD-10-CM | POA: Diagnosis not present

## 2023-05-12 DIAGNOSIS — E11649 Type 2 diabetes mellitus with hypoglycemia without coma: Secondary | ICD-10-CM | POA: Diagnosis not present

## 2023-05-12 DIAGNOSIS — I130 Hypertensive heart and chronic kidney disease with heart failure and stage 1 through stage 4 chronic kidney disease, or unspecified chronic kidney disease: Secondary | ICD-10-CM | POA: Diagnosis present

## 2023-05-12 DIAGNOSIS — I5032 Chronic diastolic (congestive) heart failure: Secondary | ICD-10-CM | POA: Diagnosis present

## 2023-05-12 DIAGNOSIS — E669 Obesity, unspecified: Secondary | ICD-10-CM | POA: Diagnosis present

## 2023-05-12 DIAGNOSIS — Z7984 Long term (current) use of oral hypoglycemic drugs: Secondary | ICD-10-CM | POA: Diagnosis not present

## 2023-05-12 DIAGNOSIS — Z79811 Long term (current) use of aromatase inhibitors: Secondary | ICD-10-CM | POA: Diagnosis not present

## 2023-05-12 DIAGNOSIS — Z79899 Other long term (current) drug therapy: Secondary | ICD-10-CM | POA: Diagnosis not present

## 2023-05-12 DIAGNOSIS — E1122 Type 2 diabetes mellitus with diabetic chronic kidney disease: Secondary | ICD-10-CM | POA: Diagnosis present

## 2023-05-12 DIAGNOSIS — N179 Acute kidney failure, unspecified: Secondary | ICD-10-CM

## 2023-05-12 DIAGNOSIS — E16A1 Hypoglycemia level 1: Secondary | ICD-10-CM | POA: Diagnosis not present

## 2023-05-12 DIAGNOSIS — E119 Type 2 diabetes mellitus without complications: Secondary | ICD-10-CM

## 2023-05-12 DIAGNOSIS — C8591 Non-Hodgkin lymphoma, unspecified, lymph nodes of head, face, and neck: Secondary | ICD-10-CM | POA: Diagnosis present

## 2023-05-12 DIAGNOSIS — J439 Emphysema, unspecified: Secondary | ICD-10-CM

## 2023-05-12 DIAGNOSIS — I2721 Secondary pulmonary arterial hypertension: Secondary | ICD-10-CM | POA: Diagnosis present

## 2023-05-12 DIAGNOSIS — Z85828 Personal history of other malignant neoplasm of skin: Secondary | ICD-10-CM | POA: Diagnosis not present

## 2023-05-12 DIAGNOSIS — Z515 Encounter for palliative care: Secondary | ICD-10-CM | POA: Diagnosis not present

## 2023-05-12 DIAGNOSIS — M549 Dorsalgia, unspecified: Secondary | ICD-10-CM | POA: Diagnosis not present

## 2023-05-12 DIAGNOSIS — Z66 Do not resuscitate: Secondary | ICD-10-CM | POA: Diagnosis present

## 2023-05-12 DIAGNOSIS — J449 Chronic obstructive pulmonary disease, unspecified: Secondary | ICD-10-CM | POA: Diagnosis present

## 2023-05-12 DIAGNOSIS — M48061 Spinal stenosis, lumbar region without neurogenic claudication: Secondary | ICD-10-CM | POA: Diagnosis present

## 2023-05-12 DIAGNOSIS — Z853 Personal history of malignant neoplasm of breast: Secondary | ICD-10-CM

## 2023-05-12 DIAGNOSIS — Z923 Personal history of irradiation: Secondary | ICD-10-CM | POA: Diagnosis not present

## 2023-05-12 DIAGNOSIS — G928 Other toxic encephalopathy: Secondary | ICD-10-CM | POA: Diagnosis present

## 2023-05-12 DIAGNOSIS — Z794 Long term (current) use of insulin: Secondary | ICD-10-CM | POA: Diagnosis not present

## 2023-05-12 LAB — BASIC METABOLIC PANEL
Anion gap: 7 (ref 5–15)
BUN: 23 mg/dL (ref 8–23)
CO2: 28 mmol/L (ref 22–32)
Calcium: 8.5 mg/dL — ABNORMAL LOW (ref 8.9–10.3)
Chloride: 105 mmol/L (ref 98–111)
Creatinine, Ser: 1.7 mg/dL — ABNORMAL HIGH (ref 0.44–1.00)
GFR, Estimated: 30 mL/min — ABNORMAL LOW (ref >60)
Glucose, Bld: 102 mg/dL — ABNORMAL HIGH (ref 70–99)
Potassium: 4.3 mmol/L (ref 3.5–5.1)
Sodium: 140 mmol/L (ref 135–145)

## 2023-05-12 LAB — GLUCOSE, CAPILLARY
Glucose-Capillary: 65 mg/dL — ABNORMAL LOW (ref 70–99)
Glucose-Capillary: 130 mg/dL — ABNORMAL HIGH (ref 70–99)

## 2023-05-12 LAB — MRSA NEXT GEN BY PCR, NASAL: MRSA by PCR Next Gen: DETECTED — AB

## 2023-05-12 LAB — CBC
HCT: 34.6 % — ABNORMAL LOW (ref 36.0–46.0)
Hemoglobin: 11 g/dL — ABNORMAL LOW (ref 12.0–15.0)
MCH: 32.8 pg (ref 26.0–34.0)
MCHC: 31.8 g/dL (ref 30.0–36.0)
MCV: 103.3 fL — ABNORMAL HIGH (ref 80.0–100.0)
Platelets: 204 10*3/uL (ref 150–400)
RBC: 3.35 MIL/uL — ABNORMAL LOW (ref 3.87–5.11)
RDW: 14.3 % (ref 11.5–15.5)
WBC: 9.9 10*3/uL (ref 4.0–10.5)
nRBC: 0 % (ref 0.0–0.2)

## 2023-05-12 LAB — CBG MONITORING, ED
Glucose-Capillary: 86 mg/dL (ref 70–99)
Glucose-Capillary: 85 mg/dL (ref 70–99)
Glucose-Capillary: 88 mg/dL (ref 70–99)

## 2023-05-12 LAB — URINE CULTURE: Culture: NO GROWTH

## 2023-05-12 MED ORDER — CHLORHEXIDINE GLUCONATE CLOTH 2 % EX PADS
6.0000 | MEDICATED_PAD | Freq: Every day | CUTANEOUS | Status: DC
  Administered 2023-05-13: 6 via TOPICAL

## 2023-05-12 MED ORDER — HYDROMORPHONE HCL 1 MG/ML IJ SOLN
1.0000 mg | INTRAMUSCULAR | Status: DC | PRN
  Administered 2023-05-12 (×2): 1 mg via INTRAVENOUS
  Filled 2023-05-12 (×3): qty 1

## 2023-05-12 MED ORDER — DEXTROSE 50 % IV SOLN: 12.5000 g | INTRAVENOUS | Status: AC

## 2023-05-12 MED ORDER — SODIUM CHLORIDE 0.9 % IV SOLN: INTRAVENOUS | Status: AC

## 2023-05-12 MED ORDER — MUPIROCIN 2 % EX OINT
1.0000 | TOPICAL_OINTMENT | Freq: Two times a day (BID) | CUTANEOUS | Status: DC
  Administered 2023-05-12 – 2023-05-15 (×6): 1 via NASAL
  Filled 2023-05-12 (×2): qty 22

## 2023-05-12 MED ORDER — DEXTROSE 50 % IV SOLN
INTRAVENOUS | Status: AC
  Administered 2023-05-12: 12.5 g via INTRAVENOUS
  Filled 2023-05-12: qty 50

## 2023-05-12 NOTE — ED Notes (Signed)
HCP notified of soft manual BP taken and new onset confusion per daughter.

## 2023-05-12 NOTE — ED Notes (Signed)
Pt son Maisie Fus called for update, given by this RN.

## 2023-05-12 NOTE — ED Notes (Signed)
This tech and Melissa NT, checked BP for this patient but due to Pt discomfort with BP cuff Manual BP was taken. (113/59)

## 2023-05-12 NOTE — Progress Notes (Addendum)
Progress Note   Patient: Melissa Mcdonald HYQ:[id number] DOB: 1940/09/26 DOA: 05/10/2023     0 DOS: the patient was seen and examined on 05/12/2023   Brief hospital course: Mrs. Melissa Mcdonald was admitted with intractable back pain.   82 y.o. female with medical history significant for Pulmonary hypertension, diastolic congestive heart failure, COPD, chronic hypoxic respiratory failure, chronic kidney disease, follicular lymphoma  diabetes, gout, left AKA, being admitted for intractable low back pain.   On presentation vital stable, labs with WBC 11,000, creatinine 1.45,slightly up from most recent baseline of 1.05 a year prior.  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 2. Moderate L4-5 facet arthrosis with mild left neural foraminal stenosis.   Patient was treated with Dilaudid and lidocaine patch.  12/6: Vital stable, IR was consulted for epidural spinal injection.  Also started on prednisone for 3 days. 12/7 Patient not candidate for epidural injection.  Patient continue to have severe back pain, her blood pressure has been low and she has been confused and disorientated.  Poor prognosis, palliative care consulted. Discussed with her daughter at the bedside.   Assessment and Plan: * Intractable back pain Spine MRI with severe right L3-4 neural foraminal stenosis.  Moderate L4-5 facet arthrosis with mid left neural foraminal stenosis.  No spinal canal stenosis.  Patient is non ambulatory, with obesity and poor functional status Not candidate for local therapy with steroid injection.   Plan to continue supportive medical therapy with analgesics, IV hydromorphone and oral oxycodone.  With low blood pressure, she carries a poor prognosis.  This was discussed with her daughter at the bedside and she decided to continue care with more  focus on palliation.  Patient is DNR Will consult palliative care.   Pulmonary arterial hypertension (HCC) Acute on chronic hypoxemic respiratory failure.  Continue supplemental 02 per [place]    AKI (acute kidney injury) (HCC) Suspected pre renal renal failure Plan to continue gently hydration with isotonic saline Follow up renal function in am, avoid hypotension and nephrotoxic medications,   S/P AKA (above knee amputation) (HCC) Patient non ambulatory, bed bound and wheelchair bound.   Chronic obstructive pulmonary disease (COPD) (HCC) Pulmonary arterial hypertension Chronic respiratory failure with hypoxia Continue home inhalers DuoNebs as needed No signs of acute exacerbation.   Diabetes mellitus without complication (HCC) Continue insulin sliding scale for glucose cover and monitoring  Patient with poor oral intake.   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    Subjective: patient continue to have severe back pain, she has been confused and disorientated.   Physical Exam: Vitals:   05/12/23 1204 05/12/23 1230 05/12/23 1255 05/12/23 1520  BP: 100/65 (!) 86/58 (!) 152/115 (!) 151/71  Pulse:  60 (!) 58 61  Resp:    20  Temp:    98.1 F (36.7 C)  TempSrc:    Oral  SpO2:  100% 96%   Weight:      Height:       Neurology somnolent but easy to arouse, ill looking appearing, in pain, very deconditioned ENT with mild pallor Cardiovascular with S1 and S2 present and regular with no gallops, rubs or murmurs Respiratory with no rales or wheezing Abdomen with no distention right lower extremity with no edema. Left AKA  Data Reviewed:    Family Communication:  I spoke with patient's daughter at the bedside, we talked in detail about patient's condition, plan of care and prognosis and all questions were addressed.   Disposition: Status is: Inpatient Remains inpatient appropriate because: IV  analgesics   Planned Discharge Destination: Skilled nursing facility      Author: Coralie Keens, MD 05/12/2023 5:09 PM  For on call review [email or web address].

## 2023-05-12 NOTE — ED Notes (Signed)
Pt just received hydrocodone for 10/10 pain. HCP approved her to get diladid as she will be entering Pallative care.

## 2023-05-12 NOTE — ED Notes (Signed)
Pt refusing breakfast, did drink coffee. Pt having some movement issues with hands, jerking, spilt some coffee and choked a bit while drinking. Pt able to cough and stated she was ok.

## 2023-05-12 NOTE — ED Notes (Addendum)
HCP notified of low BP 100/65. HCP wants to come see Pt. This RN requested fluids. Secretary paged HCP.

## 2023-05-12 NOTE — Assessment & Plan Note (Signed)
Acute on chronic hypoxemic respiratory failure.  Continue supplemental 02 per [place]

## 2023-05-12 NOTE — Progress Notes (Signed)
Patient ID: Melissa Mcdonald, female   DOB: 1940/09/30, 82 y.o.   MRN: [id number]  Patient arrived via bed. Family at bedside. Resting comfortably at this time. VSS.  BP (!) 176/90 (BP Location: Left Arm)   Pulse 61   Temp 98 F (36.7 C) (Oral)   Resp 14   Ht 5' (1.524 m)   Wt 54 kg   SpO2 96%   BMI 23.25 kg/m    Lidia Collum, RN

## 2023-05-12 NOTE — ED Notes (Signed)
Provider Jon Billings  notified of patients hypotension,  provider stated to continue to monitor

## 2023-05-12 NOTE — ED Notes (Signed)
Pt BP cuff replaced, more accurate Bps. HCP paged.

## 2023-05-12 NOTE — ED Notes (Signed)
RN ask to obtain pts blood pressure. Put a larger cuff on pts upper left arm while bp cuff was blowing up pt tried to take it off. Gently held pts hands down to obtain bp and advised pt of importance to remain still.

## 2023-05-12 NOTE — ED Notes (Signed)
Patient blood pressure not reading well due to patient contracting arm and grabbing it when it goes off, get more accurate reading when done with staff in the room to hold patients arm still and comfort patient

## 2023-05-12 NOTE — Assessment & Plan Note (Signed)
Suspected pre renal renal failure Plan to continue gently hydration with isotonic saline Follow up renal function in am, avoid hypotension and nephrotoxic medications,

## 2023-05-12 NOTE — ED Notes (Signed)
Pt hard to stay awake, will open then shut eyes, not answering this RN's questions. Will continue to monitor.

## 2023-05-12 NOTE — ED Notes (Signed)
This NT and NT Latyjua changed pts gown per RN, Luisa Hart request.

## 2023-05-13 DIAGNOSIS — M48061 Spinal stenosis, lumbar region without neurogenic claudication: Secondary | ICD-10-CM

## 2023-05-13 DIAGNOSIS — N179 Acute kidney failure, unspecified: Secondary | ICD-10-CM | POA: Diagnosis not present

## 2023-05-13 DIAGNOSIS — I10 Essential (primary) hypertension: Secondary | ICD-10-CM

## 2023-05-13 DIAGNOSIS — I2721 Secondary pulmonary arterial hypertension: Secondary | ICD-10-CM | POA: Diagnosis not present

## 2023-05-13 DIAGNOSIS — Z89612 Acquired absence of left leg above knee: Secondary | ICD-10-CM | POA: Diagnosis not present

## 2023-05-13 LAB — BASIC METABOLIC PANEL
Anion gap: 7 (ref 5–15)
BUN: 38 mg/dL — ABNORMAL HIGH (ref 8–23)
CO2: 25 mmol/L (ref 22–32)
Calcium: 8.3 mg/dL — ABNORMAL LOW (ref 8.9–10.3)
Chloride: 102 mmol/L (ref 98–111)
Creatinine, Ser: 1.86 mg/dL — ABNORMAL HIGH (ref 0.44–1.00)
GFR, Estimated: 27 mL/min — ABNORMAL LOW (ref >60)
Glucose, Bld: 175 mg/dL — ABNORMAL HIGH (ref 70–99)
Potassium: 4.2 mmol/L (ref 3.5–5.1)
Sodium: 134 mmol/L — ABNORMAL LOW (ref 135–145)

## 2023-05-13 LAB — GLUCOSE, CAPILLARY
Glucose-Capillary: 81 mg/dL (ref 70–99)
Glucose-Capillary: 132 mg/dL — ABNORMAL HIGH (ref 70–99)
Glucose-Capillary: 182 mg/dL — ABNORMAL HIGH (ref 70–99)

## 2023-05-13 MED ORDER — GABAPENTIN 300 MG PO CAPS
300.0000 mg | ORAL_CAPSULE | Freq: Three times a day (TID) | ORAL | Status: DC
  Administered 2023-05-13 – 2023-05-15 (×6): 300 mg via ORAL
  Filled 2023-05-13 (×6): qty 1

## 2023-05-13 MED ORDER — BUSPIRONE HCL 10 MG PO TABS
5.0000 mg | ORAL_TABLET | Freq: Three times a day (TID) | ORAL | Status: DC
  Administered 2023-05-13 – 2023-05-15 (×6): 5 mg via ORAL
  Filled 2023-05-13 (×6): qty 1

## 2023-05-13 MED ORDER — DULOXETINE HCL 30 MG PO CPEP
60.0000 mg | ORAL_CAPSULE | Freq: Every day | ORAL | Status: DC
  Administered 2023-05-13 – 2023-05-15 (×3): 60 mg via ORAL
  Filled 2023-05-13 (×3): qty 2

## 2023-05-13 MED ORDER — ACETAMINOPHEN 325 MG PO TABS
650.0000 mg | ORAL_TABLET | Freq: Four times a day (QID) | ORAL | Status: DC | PRN
  Administered 2023-05-13 (×2): 650 mg via ORAL
  Filled 2023-05-13: qty 2

## 2023-05-13 MED ORDER — ACETAMINOPHEN 650 MG RE SUPP
650.0000 mg | Freq: Four times a day (QID) | RECTAL | Status: DC | PRN
  Administered 2023-05-13: 650 mg via RECTAL
  Filled 2023-05-13 (×2): qty 1

## 2023-05-13 NOTE — Assessment & Plan Note (Signed)
Continue metoprolol for blood pressure control, hold on lisinopril due to acute elevation in serum cr.

## 2023-05-13 NOTE — Progress Notes (Addendum)
Progress Note   Patient: Melissa Mcdonald ZOX:[id number] DOB: [birth year] DOA: 05/10/2023     1 DOS: the patient was seen and examined on 05/13/2023   Brief hospital course: [name] was admitted with intractable back pain.   82 y.o. female with medical history significant for Pulmonary hypertension, diastolic congestive heart failure, COPD, chronic hypoxic respiratory failure, chronic kidney disease, follicular lymphoma  diabetes, gout, left AKA, being admitted for intractable low back pain.  Patient developed severe back pain, prompting EMS call. She was found in severe pain and was transported to the ED.  On her initial physical examination her blood pressure was 98/82, HR 61, RR 16 and 02 saturation 95%, she was noted to be in distress due to pain, her lungs had no wheezing or rhonchi, heart with S1 and S2 present and regular, abdomen with no distention, she had left AKA with no  right lower extremity edema.   Na 141, K 3,9 Cl 101 bicarbonate 29, glucose 115 bun 26 cr 1.45  AST 17 ALT 15  Lipase 28  Wbc 11,0 hgb 12.7 plt 217  Urine analysis SG 1,033, negative protein, negative leukocytes, negative hgb, glucose > 500.   CT abdomen and pelvis with cholelithiasis without evidence of acute cholecystitis.  No bowel obstruction or inflammation  Aortic atherosclerosis.  Degenerative changes in the lumbar spine, SI joints and hips.   MRI lumbar spine with severe right L3-4 neural foraminal stenosis.  Moderate L4-5 facet arthrosis with mild left neural foraminal stenosis.  No spinal canal stenosis.   Patient was treated with hydromorphone nd lidocaine patch.  12/6: Vital stable, IR was consulted for epidural spinal injection.  Also started on prednisone for 3 days. 12/7 Patient not candidate for epidural injection.  Patient continue to have severe back pain, her blood pressure has been low and she has been confused and disorientated.  Poor prognosis, palliative care consulted.  Discussed with her daughter at the bedside.  12/08 today patient is more awake and her back pain is better controlled.  Plan to transition to oral analgesics in the next 24 hrs.  Follow up with palliative care as outpatient.   Assessment and Plan: * Lumbar foraminal stenosis Intractable back pain on admission.  Spine MRI with severe right L3-4 neural foraminal stenosis.  Moderate L4-5 facet arthrosis with mid left neural foraminal stenosis.  No spinal canal stenosis.  Patient is non ambulatory, with obesity and poor functional status Not candidate for local therapy with steroid injection.   Plan to continue supportive medical therapy with analgesics, IV hydromorphone and oral oxycodone.  Her blood pressure is more stable today, with systolic in the 120's.  Continue with methocarbamol as needed for muscle spasms.  Topical lidocaine patch.  Gabapentin 300 mg tid.   As needed alprazolam for anxiety.  Resume duloxetine and buspirone.   Plan to continue conservative supportive medical care.  Follow up with palliative care consultation, she will benefit to continue palliative care as outpatient.  Will discontinue steroids for now.    Pulmonary arterial hypertension (HCC) Acute on chronic hypoxemic respiratory failure.  Continue supplemental 02 per [place]    AKI (acute kidney injury) (HCC) Patient more awake today with improved po intake. Continue IV fluids for now and follow up renal function today and tomorrow.   S/P AKA (above knee amputation) (HCC) Patient non ambulatory, bed bound and wheelchair bound.   Chronic obstructive pulmonary disease (COPD) (HCC) No signs of acute exacerbation.  Continue inhaled corticosteroids  and bronchodilator therapy.   Diabetes mellitus without complication (HCC) Hypoglycemia.   Continue insulin sliding scale for glucose cover and monitoring  Basal insulin 10 units (reduced dose)  Capillary glucose today 65, 130, 81, 132.  Po intake is better  today.   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   Hypertension Continue metoprolol for blood pressure control, hold on lisinopril due to acute elevation in serum cr.         Subjective: patient with improvement in back pain, she is tolerating po better today, continue very weak and deconditioned   Physical Exam: Vitals:   05/13/23 0030 05/13/23 0130 05/13/23 0734 05/13/23 0939  BP:   (!) 102/41 (!) 112/46  Pulse:   85 84  Resp:   20 16  Temp: (!) 101.4 F (38.6 C) 99.2 F (37.3 C) (!) 101.4 F (38.6 C) 98.1 F (36.7 C)  TempSrc: Rectal Oral Oral Oral  SpO2:   99% 96%  Weight:      Height:       Neurology awake and alert, deconditioned ENT with mild pallor Cardiovascular with S1 and S2 present and regular with no gallops, rubs or murmurs Respiratory with no rales or wheezing, poor inspiratory effort Abdomen with no distention  Left AKA, right lower extremity with no edema  Data Reviewed:    Family Communication: I spoke with patient's daughter at the bedside, we talked in detail about patient's condition, plan of care and prognosis and all questions were addressed.   Disposition: Status is: Inpatient Remains inpatient appropriate because: IV analgesics, plan to return to SNF with palliative care   Planned Discharge Destination: Skilled nursing facility      Author: Coralie Keens, MD 05/13/2023 12:21 PM  For on call review [email or web address].

## 2023-05-13 NOTE — Progress Notes (Signed)
Pt was seen stuporous during change of shift. Per family members, pt was like this in the ED especially after taking narcotics. It is for this reason her Gabapentin was held and family members were educated regarding the medication. Oral meds were held with family member discretion as well.  RN observed jerking movements that were observed by ED nurses as well. However the intensity was reduced once patient was given more blankets. Provider on call was notified of Neurontin not given due to patient's stuporous status.   Close to midnight patient had temperature of 100.9. Provider was notified and tylenol suppository parameters were changed. Provider also requested rectal temperature to be obtained before administering the suppository. Please see flowsheet. Temperature was reduced to 99.2 one hour after tylenol suppository admin.

## 2023-05-13 NOTE — Plan of Care (Signed)

## 2023-05-13 NOTE — Progress Notes (Signed)
Patient ID: Melissa Mcdonald, female   DOB: 03-14-1941, 82 y.o.   MRN: [id number]   05/13/23 0734  Assess: MEWS Score  Temp (!) 101.4 F (38.6 C)  BP (!) 102/41  MAP (mmHg) (!) 57  Pulse Rate 85  Resp 20  SpO2 99 %  O2 Device Nasal Cannula  Assess: MEWS Score  MEWS Temp 1  MEWS Systolic 0  MEWS Pulse 0  MEWS RR 0  MEWS LOC 1  MEWS Score 2  MEWS Score Color Yellow  Assess: if the MEWS score is Yellow or Red  Were vital signs accurate and taken at a resting state? Yes  Does the patient meet 2 or more of the SIRS criteria? Yes  Does the patient have a confirmed or suspected source of infection? No  MEWS guidelines implemented  Yes, yellow  Treat  MEWS Interventions Considered administering scheduled or prn medications/treatments as ordered  Take Vital Signs  Increase Vital Sign Frequency  Yellow: Q2hr x1, continue Q4hrs until patient remains green for 12hrs  Escalate  MEWS: Escalate Yellow: Discuss with charge nurse and consider notifying provider and/or RRT  Provider Notification  Provider Name/Title Arrien  Date Provider Notified 05/13/23  Time Provider Notified 0745  Method of Notification Page  Notification Reason Other (Comment)  Provider response No new orders  Assess: SIRS CRITERIA  SIRS Temperature  1  SIRS Pulse 0  SIRS Respirations  0  SIRS WBC 0  SIRS Score Sum  1   Tylenol given. No rapid response called at this time due to prognosis. Awaiting physician response.  Lidia Collum, RN

## 2023-05-13 NOTE — Plan of Care (Signed)
Patient ID: Melissa Mcdonald, female   DOB: Dec 09, 1940, 82 y.o.   MRN: [id number]  Problem: Education: Goal: Ability to describe self-care measures that may prevent or decrease complications (Diabetes Survival Skills Education) will improve Outcome: Progressing Goal: Individualized Educational Video(s) Outcome: Progressing   Problem: Coping: Goal: Ability to adjust to condition or change in health will improve Outcome: Progressing   Problem: Fluid Volume: Goal: Ability to maintain a balanced intake and output will improve Outcome: Progressing   Problem: Health Behavior/Discharge Planning: Goal: Ability to identify and utilize available resources and services will improve Outcome: Progressing Goal: Ability to manage health-related needs will improve Outcome: Progressing   Problem: Metabolic: Goal: Ability to maintain appropriate glucose levels will improve Outcome: Progressing   Problem: Nutritional: Goal: Maintenance of adequate nutrition will improve Outcome: Progressing Goal: Progress toward achieving an optimal weight will improve Outcome: Progressing   Problem: Skin Integrity: Goal: Risk for impaired skin integrity will decrease Outcome: Progressing   Problem: Tissue Perfusion: Goal: Adequacy of tissue perfusion will improve Outcome: Progressing   Problem: Education: Goal: Knowledge of General Education information will improve Description: Including pain rating scale, medication(s)/side effects and non-pharmacologic comfort measures Outcome: Progressing   Problem: Health Behavior/Discharge Planning: Goal: Ability to manage health-related needs will improve Outcome: Progressing   Problem: Clinical Measurements: Goal: Ability to maintain clinical measurements within normal limits will improve Outcome: Progressing Goal: Will remain free from infection Outcome: Progressing Goal: Diagnostic test results will improve Outcome: Progressing Goal: Respiratory  complications will improve Outcome: Progressing Goal: Cardiovascular complication will be avoided Outcome: Progressing   Problem: Activity: Goal: Risk for activity intolerance will decrease Outcome: Progressing   Problem: Nutrition: Goal: Adequate nutrition will be maintained Outcome: Progressing   Problem: Coping: Goal: Level of anxiety will decrease Outcome: Progressing   Problem: Elimination: Goal: Will not experience complications related to bowel motility Outcome: Progressing Goal: Will not experience complications related to urinary retention Outcome: Progressing   Problem: Pain Management: Goal: General experience of comfort will improve Outcome: Progressing   Problem: Safety: Goal: Ability to remain free from injury will improve Outcome: Progressing   Problem: Skin Integrity: Goal: Risk for impaired skin integrity will decrease Outcome: Progressing    Lidia Collum, RN

## 2023-05-14 DIAGNOSIS — M48061 Spinal stenosis, lumbar region without neurogenic claudication: Secondary | ICD-10-CM | POA: Diagnosis not present

## 2023-05-14 LAB — GLUCOSE, CAPILLARY
Glucose-Capillary: 120 mg/dL — ABNORMAL HIGH (ref 70–99)
Glucose-Capillary: 94 mg/dL (ref 70–99)
Glucose-Capillary: 124 mg/dL — ABNORMAL HIGH (ref 70–99)
Glucose-Capillary: 122 mg/dL — ABNORMAL HIGH (ref 70–99)

## 2023-05-14 LAB — BASIC METABOLIC PANEL WITH GFR
Anion gap: 9 (ref 5–15)
BUN: 39 mg/dL — ABNORMAL HIGH (ref 8–23)
CO2: 24 mmol/L (ref 22–32)
Calcium: 8.5 mg/dL — ABNORMAL LOW (ref 8.9–10.3)
Chloride: 102 mmol/L (ref 98–111)
Creatinine, Ser: 1.39 mg/dL — ABNORMAL HIGH (ref 0.44–1.00)
GFR, Estimated: 38 mL/min — ABNORMAL LOW
Glucose, Bld: 122 mg/dL — ABNORMAL HIGH (ref 70–99)
Potassium: 4.3 mmol/L (ref 3.5–5.1)
Sodium: 135 mmol/L (ref 135–145)

## 2023-05-14 NOTE — TOC Initial Note (Addendum)
Transition of Care [facility]) - Initial/Assessment Note    Patient Details  Name: Melissa Mcdonald MRN: [id number] Date of Birth: May 11, 1941  Transition of Care [facility]) CM/SW Contact:    Marlowe Sax, RN Phone Number: 05/14/2023, 3:09 PM  Clinical Narrative:                  Met with the patient in the room, she is a long term rsident at [facility] and plans to return at DC will need EMS to transport Pt to eval and make recommendations, I notified [facility]ney that she plans to return and will need Ins approval and Fl2 to get Ins approval to get therapy  Expected Discharge Plan: Long Term Nursing Home Barriers to Discharge: Continued Medical Work up   Patient Goals and CMS Choice            Expected Discharge Plan and Services   Discharge Planning Services: CM Consult   Living arrangements for the past 2 months: Skilled Nursing Facility                           HH Arranged: NA          Prior Living Arrangements/Services Living arrangements for the past 2 months: Skilled Nursing Facility Lives with:: Facility Resident Patient language and need for interpreter reviewed:: Yes Do you feel safe going back to the place where you live?: Yes      Need for Family Participation in Patient Care: Yes (Comment) Care giver support system in place?: Yes (comment)   Criminal Activity/Legal Involvement Pertinent to Current Situation/Hospitalization: No - Comment as needed  Activities of Daily Living      Permission Sought/Granted   Permission granted to share information with : Yes, Verbal Permission Granted              Emotional Assessment Appearance:: Appears stated age   Affect (typically observed): Accepting Orientation: : Oriented to Self, Oriented to Place, Oriented to  Time, Oriented to Situation Alcohol / Substance Use: Not Applicable Psych Involvement: No (comment)  Admission diagnosis:  AKI (acute kidney injury) (HCC) [N17.9] Intractable pain  [R52] Intractable back pain [M54.9] Low back pain, unspecified back pain laterality, unspecified chronicity, unspecified whether sciatica present [M54.50] Patient Active Problem List   Diagnosis Date Noted   AKI (acute kidney injury) (HCC) 05/12/2023   Lumbar foraminal stenosis 05/11/2023   Chronic obstructive pulmonary disease (COPD) (HCC) 05/11/2023   Chronic respiratory failure with hypoxia (HCC) 05/11/2023   Diabetes mellitus without complication (HCC) 05/11/2023   Acute on chronic combined systolic and diastolic CHF (congestive heart failure) (HCC) 05/11/2023   History of breast cancer 05/11/2023   S/P AKA (above knee amputation) (HCC) 05/11/2023   Intractable pain 05/11/2023   Diverticulosis 07/16/2019   Neuropathy 07/16/2019   Shortness of breath 07/26/2018   Localized edema 07/26/2018   Palliative care encounter 07/26/2018   Goals of care, counseling/discussion 08/21/2017   Acute respiratory failure with hypoxia (HCC) 07/30/2017   Sepsis (HCC) 07/30/2017   Influenza A 07/30/2017   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   Elevated uric acid in blood 07/28/2017   Hyperuricemia 07/17/2017   Abnormal CT of the head 06/08/2017   RUQ fullness 12/15/2016   Atypical chest pain 01/17/2016   Thyroid nodule 11/12/2015   Abnormal PET scan of mediastinum 11/03/2015   Parotid nodule 11/03/2015   Hypocalcemia 09/19/2015   Post-operative state 12/19/2013  Iron deficiency anemia 12/05/2013   Diverticulosis of colon 11/25/2013   History of total knee replacement 11/25/2013   Hypertension 11/25/2013   Migraine, unspecified, not intractable, without status migrainosus 11/25/2013   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/25/2013   Mononeuritis 11/25/2013   Other chronic pulmonary heart diseases 11/25/2013   Osteoarthritis 10/06/2013   Breast cancer (HCC) 01/08/2013   Above knee amputation status 09/23/2012   Sleep apnea 07/29/2012   Anemia 04/30/2012   Infection or  inflammatory reaction due to internal joint prosthesis (HCC) 03/13/2012   Infection following a procedure, unspecified, initial encounter 12/14/2011   Lymphedema 11/01/2011   Pulmonary arterial hypertension (HCC) 2009   PCP:  Dale Woodlynne, MD Pharmacy:   [facility] [address] - [address] [place] [address] Phone: [phone] Fax: [phone]     Social Determinants of Health (SDOH) Social History: SDOH Screenings   Food Insecurity: Unknown (05/13/2023)  Transportation Needs: Unknown (05/13/2023)  Financial Resource Strain: Low Risk  (07/30/2017)  Physical Activity: Unknown (07/30/2017)  Social Connections: Unknown (07/30/2017)  Stress: No Stress Concern Present (07/30/2017)  Tobacco Use: Low Risk  (05/10/2023)   SDOH Interventions:     Readmission Risk Interventions     No data to display

## 2023-05-14 NOTE — Progress Notes (Signed)
PROGRESS NOTE Melissa Mcdonald  ZOX:[id number] DOB: 1940-12-09 DOA: 05/10/2023 PCP: Dale Zortman, MD  Brief Narrative/Hospital Course: Melissa Mcdonald was admitted with intractable back pain.   82 y.o. female with medical history significant for Pulmonary hypertension, diastolic congestive heart failure, COPD, chronic hypoxic respiratory failure, chronic kidney disease, follicular lymphoma  diabetes, gout, left AKA, being admitted for intractable low back pain.  Patient developed severe back pain, prompting EMS call. She was found in severe pain and was transported to the ED.  On her initial physical examination her blood pressure was 98/82, HR 61, RR 16 and 02 saturation 95%, she was noted to be in distress due to pain, her lungs had no wheezing or rhonchi, heart with S1 and S2 present and regular, abdomen with no distention, she had left AKA with no  right lower extremity edema.  LABS>> Na 141, K 3,9 Cl 101 bicarbonate 29, glucose 115 bun 26 cr 1.45  AST 17 ALT 15 Lipase 28  Wbc 11,0 hgb 12.7 plt 217  Urine analysis SG 1,033, negative protein, negative leukocytes, negative hgb, glucose > 500.   CT abdomen and pelvis with cholelithiasis without evidence of acute cholecystitis.  No bowel obstruction or inflammation  Aortic atherosclerosis. Degenerative changes in the lumbar spine, SI joints and hips.   MRI lumbar spine with severe right L3-4 neural foraminal stenosis.  Moderate L4-5 facet arthrosis with mild left neural foraminal stenosis.  No spinal canal stenosis.  Patient was treated with hydromorphone nd lidocaine patch. 12/6: Vital stable, IR was consulted for epidural spinal injection.  Also started on prednisone for 3 days. 12/7 Patient not candidate for epidural injection.  Patient continue to have severe back pain, her blood pressure has been low and she has been confused and disorientated.   Poor prognosis, palliative care consulted. Discussed with her daughter at the bedside.   12/08 today patient is more awake and her back pain is better controlled. Plan to transition to oral analgesics in the next 24 hrs.  Follow up with palliative care as outpatient.   12/9: Afebrile, labs shows creatinine improving 1.3 TAKING Cymbalta, Neurontin 300 3 times daily, taking Norco Xanax Tylenol as needed    Subjective: Patient seen and examined this morning. 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 Moderate L4-5 facet arthrosis with mid left neural foraminal stenosis.  No spinal canal stenosis. Patient is non ambulatory, with obesity and poor functional status Not candidate for local therapy with steroid injection. Continue with supportive care analgesics, lidocaine patch, Neurontin Xanax along with Cymbalta and BuSpar Supportive care, PMDT eval and conservative management. Off steroids  COPD Pulmonary arterial hypertension Chronic respiratory failure with hypoxia: Currently doing well on 2 L nasal cannula.  Continue respiratory support, inhaled corticosteroids bronchodilator.  Not in exacerbation  AKI: Creatinine has improved. Recent Labs  Lab 05/10/23  2054 05/12/23 0457 05/13/23 1255 05/14/23 0452  BUN 26* 23 38* 39*  CREATININE 1.45* 1.70* 1.86* 1.39*    S/P AKA (above knee amputation) (HCC) Patient non  ambulatory, bed bound and wheelchair bound.   Diabetes mellitus with hyperglycemia episode, resolved. Basal insulin 10 units (reduced dose)  Recent Labs  Lab 05/11/23 0435 05/11/23 0859 05/13/23 0735 05/13/23 1123 05/13/23 2106 05/14/23 0845 05/14/23 1228  GLUCAP  --    < > 81 132* 182* 120* 94  HGBA1C 6.0*  --   --   --   --   --   --    < > = values in this interval not displayed.    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 Patient status is: Remains hospitalized because of BACK PAIN Level of care: Med-Surg   Dispo: The patient is from: snf            Anticipated disposition: snf I N24 HRS Objective: Vitals last 24 hrs: Vitals:   05/13/23 1833 05/13/23 2320 05/14/23 0323 05/14/23 0813  BP: (!) 156/124 (!) 99/46 (!) 108/57 98/66  Pulse: 67 60 63 62  Resp: 18 18 16 18   Temp: 98.5 F (36.9 C) (!) 97.2 F (36.2 C) (!) 97.4 F (36.3 C) 97.8 F (36.6 C)  TempSrc: Oral   Oral  SpO2: 97% 99% 100% 97%  Weight:      Height:       Weight change:   Physical Examination: General exam: alert awake, OBESE,older than stated age HEENT:Oral mucosa moist, Ear/Nose WNL grossly Respiratory system: bilaterally CLEAR BS, no use of accessory muscle Cardiovascular system: S1 & S2 +, No JVD. Gastrointestinal system: Abdomen soft,NT,ND, BS+ Nervous System:Alert, awake, moving UE Extremities: LE edema NEG,distal peripheral pulses palpable.   Skin: No rashes,no icterus. MSK: Normal muscle bulk,tone, power  Medications reviewed:  Scheduled Meds:  allopurinol  300 mg Oral Daily   busPIRone  5 mg Oral TID   DULoxetine  60 mg Oral Daily   enoxaparin (LOVENOX) injection  30 mg Subcutaneous Q24H   gabapentin  300 mg Oral TID   insulin aspart  0-15 Units Subcutaneous TID WC   insulin aspart  0-5 Units Subcutaneous QHS   insulin glargine-yfgn  10 Units Subcutaneous Daily   letrozole  2.5 mg Oral Daily   lidocaine  1 patch Transdermal Q24H   metoprolol tartrate  50 mg Oral Daily   mometasone-formoterol  2 puff Inhalation BID   mupirocin ointment  1 Application Nasal BID  Continuous Infusions:   Diet Order             Diet heart healthy/carb modified Room service appropriate? No; Fluid consistency: Thin  Diet effective now                   Intake/Output Summary (Last 24 hours) at 05/14/2023 1332 Last data filed at 05/14/2023 1133 Gross per 24 hour  Intake 2073.73 ml  Output 900 ml  Net 1173.73 ml   Net IO Since Admission: 1,673.73  mL [05/14/23 1332]  Wt Readings from Last 3 Encounters:  05/10/23 54 kg  07/25/21 54 kg  03/30/21 104.3 kg   Unresulted Labs (From admission, onward)    None     Data Reviewed: I have personally reviewed following labs and imaging studies CBC: Recent Labs  Lab 05/10/23 2054 05/12/23 0457  WBC 11.0* 9.9  NEUTROABS 8.7*  --   HGB 12.7 11.0*  HCT 38.7 34.6*  MCV 99.5 103.3*  PLT 217 204  Basic Metabolic Panel:  Recent Labs  Lab 05/10/23 2054 05/12/23 0457 05/13/23 1255 05/14/23 0452  NA 141 140 134* 135  K 3.9 4.3 4.2 4.3  CL 101 105 102 102  CO2 29 28 25 24   GLUCOSE 115* 102* 175* 122*  BUN 26* 23 38* 39*  CREATININE 1.45* 1.70* 1.86* 1.39*  CALCIUM 9.0 8.5* 8.3* 8.5*   GFR: Estimated Creatinine Clearance: 22.8 mL/min (A) (by C-G formula based on SCr of 1.39 mg/dL (H)). Liver Function Tests:  Recent Labs  Lab 05/10/23 2054  AST 17  ALT 15  ALKPHOS 74   BILITOT 0.8  PROT 7.1  ALBUMIN 3.6   Recent Labs  Lab 05/10/23 2054  LIPASE 28   No results for input(s): "AMMONIA" in the last 168 hours. Recent Labs  Lab 05/13/23 0735 05/13/23 1123 05/13/23 2106 05/14/23 0845 05/14/23 1228  GLUCAP 81 132* 182* 120* 94  No results for input(s): "PROCALCITON", "LATICACIDVEN" in the last 168 hours. Recent Results (from the past 240 hour(s))  Urine Culture     Status: None   Collection Time: 05/11/23  1:46 AM   Specimen: Urine, Catheterized  Result Value Ref Range Status   Specimen Description   Final    URINE, CATHETERIZED Performed at [facility], [address] [place], [place] [address]    Special Requests   Final    NONE Performed at [facility], [address] [place], [place] [address]    Culture   Final    NO GROWTH Performed at [facility], [address] [place], [place] [address]    Report Status 05/12/2023 FINAL  Final  MRSA Next Gen by PCR, Nasal     Status: Abnormal   Collection Time: 05/12/23  7:00 PM   Specimen: Nasal Mucosa; Nasal Swab  Result Value Ref Range Status   MRSA by PCR Next Gen DETECTED (A) NOT DETECTED Final    Comment: CRITICAL RESULT CALLED TO, READ BACK BY AND VERIFIED WITH: Jaynie Crumble RN @ 2149 05/12/23 [facility] (NOTE) The GeneXpert MRSA Assay (FDA approved for NASAL specimens only), is one component of a comprehensive MRSA colonization surveillance program. It is not intended to diagnose MRSA infection nor to guide or monitor treatment for MRSA infections. Test performance is not FDA approved in patients less than 17 years old. Performed at [facility], [address], [place], [place] [address]   Antimicrobials/Microbiology: Anti-infectives (From admission, onward)    None         Component Value Date/Time   SDES  05/11/2023 0146    URINE, CATHETERIZED Performed at [facility], [address] [place], [place] [address]    [facility]   05/11/2023 [phone]    NONE Performed at [facility], [address], [place], [place] [address]    CULT  05/11/2023 0146    NO GROWTH Performed at [facility], [address], [place], [place] [address]    REPTSTATUS 05/12/2023 FINAL 05/11/2023  6295  Radiology Studies: No results found.  LOS: 2 days  Total time spent in review of labs and imaging, patient evaluation, formulation of plan, documentation and communication with family: 35 minutes Lanae Boast, MD [facility]  05/14/2023, 1:32 PM

## 2023-05-15 DIAGNOSIS — M48061 Spinal stenosis, lumbar region without neurogenic claudication: Secondary | ICD-10-CM | POA: Diagnosis not present

## 2023-05-15 LAB — GLUCOSE, CAPILLARY
Glucose-Capillary: 94 mg/dL (ref 70–99)
Glucose-Capillary: 128 mg/dL — ABNORMAL HIGH (ref 70–99)

## 2023-05-15 MED ORDER — ALPRAZOLAM 0.25 MG PO TABS: 0.2500 mg | ORAL_TABLET | Freq: Two times a day (BID) | ORAL | 0 refills | Status: DC | PRN

## 2023-05-15 MED ORDER — SIMVASTATIN 10 MG PO TABS: ORAL_TABLET | ORAL | Status: DC

## 2023-05-15 MED ORDER — GABAPENTIN 300 MG PO CAPS: 300.0000 mg | ORAL_CAPSULE | Freq: Three times a day (TID) | ORAL | Status: DC

## 2023-05-15 MED ORDER — INSULIN GLARGINE 100 UNIT/ML ~~LOC~~ SOLN: 12.0000 [IU] | Freq: Every day | SUBCUTANEOUS | Status: DC

## 2023-05-15 NOTE — TOC Progression Note (Signed)
Transition of Care [facility]) - Progression Note    Patient Details  Name: Melissa Mcdonald MRN: [id number] Date of Birth: Aug 02, 1940  Transition of [facility]) CM/SW Contact  Marlowe Sax, RN Phone Number: 05/15/2023, 11:43 AM  Clinical Narrative:    Patient to DC back to [facility] room 216, family in the room and aware, EMS called for transport   Expected Discharge Plan: Long Term Nursing Home Barriers to Discharge: Continued Medical Work up  Expected Discharge Plan and Services   Discharge Planning Services: CM Consult   Living arrangements for the past 2 months: Skilled Nursing Facility Expected Discharge Date: 05/15/23                         [facility] Arranged: NA           Social Determinants of Health (SDOH) Interventions SDOH Screenings   Food Insecurity: Unknown (05/13/2023)  Transportation Needs: Unknown (05/13/2023)  Financial Resource Strain: Low Risk  (07/30/2017)  Physical Activity: Unknown (07/30/2017)  Social Connections: Unknown (07/30/2017)  Stress: No Stress Concern Present (07/30/2017)  Tobacco Use: Low Risk  (05/10/2023)    Readmission Risk Interventions     No data to display

## 2023-05-15 NOTE — Progress Notes (Signed)
PT Cancellation Note  Patient Details Name: Melissa Mcdonald MRN: [id number] DOB: September 21, 1940   Cancelled Treatment:    Reason Eval/Treat Not Completed: PT screened, no needs identified, will sign off.  PT consult received.  Chart reviewed.  Pt LTC resident at [facility].  TOC reports calling facility this morning and that facility reports pt is bed bound at baseline (has w/c but pt chooses to stay in bed).  Discharge planned for today.  No acute PT needs identified; will sign off.  Hendricks Limes, PT 05/15/23, 11:27 AM

## 2023-05-15 NOTE — Care Management Important Message (Signed)
Important Message  Patient Details  Name: Melissa Mcdonald MRN: [id number] Date of Birth: Jan 18, 1941   Important Message Given:  Yes - Medicare IM  Late entry from this morning. I reviewed form with the patients HCPOA, Melissa Mcdonald by phone [phone] and she stated she understood these rights. I wished her a good day and thanked her for her time.   Olegario Messier A Kaysa Roulhac 05/15/2023, 12:43 PM

## 2023-05-15 NOTE — Discharge Summary (Signed)
Physician Discharge Summary  ELEASE Mcdonald TDV:[id number] DOB: 1941-01-24 DOA: 05/10/2023  PCP: Dale Hollyvilla, MD  Admit date: 05/10/2023 Discharge date: 05/15/2023 Recommendations for Outpatient Follow-up:  Follow up with PCP in 1 weeks-call for appointment Please f/u with palliative care at the facility Please obtain BMP/CBC in one week  Discharge Dispo: NH Discharge Condition: Stable Code Status:   Code Status: Limited: Do not attempt resuscitation (DNR) -DNR-LIMITED -Do Not Intubate/DNI  Diet recommendation:  Diet Order             Diet heart healthy/carb modified Room service appropriate? No; Fluid consistency: Thin  Diet effective now                    Brief/Interim Summary: Melissa Mcdonald was admitted with intractable back pain.   82 y.o. female with medical history significant for Pulmonary hypertension, diastolic congestive heart failure, COPD, chronic hypoxic respiratory failure, chronic kidney disease, follicular lymphoma  diabetes, gout, left AKA, being admitted for intractable low back pain.  Patient developed severe back pain, prompting EMS call. She was found in severe pain and was transported to the ED.  On her initial physical examination her blood pressure was 98/82, HR 61, RR 16 and 02 saturation 95%, she was noted to be in distress due to pain, her lungs had no wheezing or rhonchi, heart with S1 and S2 present and regular, abdomen with no distention, she had left AKA with no  right lower extremity edema.  LABS>> Na 141, K 3,9 Cl 101 bicarbonate 29, glucose 115 bun 26 cr 1.45  AST 17 ALT 15 Lipase 28  Wbc 11,0 hgb 12.7 plt 217  Urine analysis SG 1,033, negative protein, negative leukocytes, negative hgb, glucose > 500.   CT abdomen and pelvis with cholelithiasis without evidence of acute cholecystitis.  No bowel obstruction or inflammation  Aortic atherosclerosis. Degenerative changes in the lumbar spine, SI joints and hips.   MRI lumbar spine  with severe right L3-4 neural foraminal stenosis.  Moderate L4-5 facet arthrosis with mild left neural foraminal stenosis.  No spinal canal stenosis.  Patient was treated with hydromorphone nd lidocaine patch. 12/6: Vital stable, IR was consulted for epidural spinal injection.  Also started on prednisone for 3 days. 12/7 Patient not candidate for epidural injection.  Patient continue to have severe back pain, her blood pressure has been low and she has been confused and disorientated.   Poor prognosis, palliative care consulted. Discussed with her daughter at the bedside.  12/08 today patient is more awake and her back pain is better controlled. Plan to transition to oral analgesics in the next 24 hrs.  Follow up with palliative care as outpatient.  12/9: Afebrile, labs shows creatinine improving 1.3 TAKING Cymbalta, Neurontin 300 3 times daily, taking Norco Xanax Tylenol as needed.  Mentation is significantly improved, pain control at this time she is stable for discharge palliative care was consulted and pending but she is already DNR limited will advise palliative care follow-up at nursing facility.   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 Moderate L4-5 facet arthrosis with mid left neural foraminal stenosis.  No spinal canal stenosis. Patient is non ambulatory, with obesity and poor functional status Not candidate for local therapy with steroid injection.Continue with supportive care analgesics, lidocaine patch,  Neurontin Xanax along with Cymbalta and BuSpar Supportive care, PMDT eval advised outpatient.    COPD Pulmonary arterial hypertension Pulmonary insufficiency on admission- resolved currently.  doing well on room air no history of respiratory failure.   Continue respiratory support, inhaled corticosteroids bronchodilator.  Not in exacerbation  Hypertension BP soft. AKI: Creatinine has improved.  Torsemide has been discontinued changed to prn-advised to follow-up with her cardiology Holding her ACE inhibitor or Entresto torsemide as blood pressure is soft Recent Labs  Lab 05/10/23 2054 05/12/23 0457 05/13/23 1255 05/14/23 0452  BUN 26* 23 38* 39*  CREATININE 1.45* 1.70* 1.86* 1.39*    S/P AKA (above knee amputation) (HCC) Patient non ambulatory, bed bound and wheelchair bound.   Diabetes mellitus with hyperglycemia episode, resolved. Basal insulin dose has been reduced continue the same.   Recent Labs  Lab 05/11/23 0435 05/11/23 0859 05/14/23 1228 05/14/23 1648 05/14/23 2120 05/15/23 0828 05/15/23 1123  GLUCAP  --    < > 94 124* 122* 94 128*  HGBA1C 6.0*  --   --   --   --   --   --    < > = values in this interval not displayed.    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: Fu as OP  Hypertension: BP stable continue metoprolol holding lisinopril  Constipation: Patient had laxatives with good results yesterday, continue MiraLAX/stool softener PRN    Consults: none Subjective: Alert awake resting comfortably, eager to return to nursing home today daughter at the bedside  Discharge Exam: Vitals:   05/15/23 0025 05/15/23 0827  BP: 112/66 (!) 105/52  Pulse: 72 64  Resp: 20 17  Temp: 97.6 F (36.4 C) 97.8 F (36.6 C)  SpO2: 95% 98%   General: Pt is alert, awake, not in acute distress Cardiovascular: RRR, S1/S2 +, no rubs, no gallops Respiratory: CTA bilaterally, no wheezing, no rhonchi Abdominal: Soft, NT, ND,  bowel sounds + Extremities: no edema, no cyanosis  Discharge Instructions  Discharge Instructions     (HEART FAILURE PATIENTS) Call MD:  Anytime you have any of the following symptoms: 1) 3 pound weight gain in 24 hours or 5 pounds in 1 week 2) shortness of breath, with or without a dry hacking cough 3) swelling in the hands, feet or stomach 4) if you have to sleep on extra pillows at night in order to breathe.   Complete by: As directed    Discharge instructions   Complete by: As directed    Please call call MD or return to ER for similar or worsening recurring problem that brought you to hospital or if any fever,nausea/vomiting,abdominal pain, uncontrolled pain, chest pain,  shortness of breath or any other alarming symptoms.  Please follow-up your doctor as instructed in a week time and call the office for appointment.  Please avoid alcohol, smoking, or any other illicit substance and maintain healthy habits including taking your regular medications as prescribed.  You were cared for by a hospitalist during your hospital stay. If you have any questions about your discharge medications or the care you received while you were in the hospital after you are discharged, you can call the  unit and ask to speak with the hospitalist on call if the hospitalist that took care of you is not available.  Once you are discharged, your primary care physician will handle any further medical issues. Please note that NO REFILLS for any discharge medications will be authorized once you are discharged, as it is imperative that you return to your primary care physician (or establish a relationship with a primary care physician if you do not have one) for your aftercare needs so that they can reassess your need for medications and monitor your lab values   Increase activity slowly   Complete by: As directed       Allergies as of 05/15/2023       Reactions   Pantoprazole Sodium Diarrhea   Aleve [naproxen  Sodium] Swelling   Iron Nausea And Vomiting   "Oral Iron" per patient        Medication List     STOP taking these medications    ARIPiprazole 5 MG tablet Commonly known as: ABILIFY   CALCIUM-CARB 600 PO   calcium-vitamin D 500-200 MG-UNIT Tabs tablet Commonly known as: OSCAL WITH D   Entresto 49-51 MG Generic drug: sacubitril-valsartan   ferrous sulfate 325 (65 FE) MG tablet   folic acid 1 MG tablet Commonly known as: FOLVITE   HYDROcodone-acetaminophen 5-325 MG tablet Commonly known as: NORCO/VICODIN   HYDROcodone-acetaminophen 7.5-325 MG tablet Commonly known as: NORCO   insulin aspart 100 UNIT/ML injection Commonly known as: novoLOG   lisinopril 2.5 MG tablet Commonly known as: ZESTRIL   Magnesium 250 MG Tabs   potassium chloride SA 20 MEQ tablet Commonly known as: KLOR-CON M   torsemide 20 MG tablet Commonly known as: DEMADEX   VITAMIN B 12 PO       TAKE these medications    acetaminophen 325 MG tablet Commonly known as: TYLENOL Take 650 mg by mouth at bedtime as needed.   albuterol 108 (90 Base) MCG/ACT inhaler Commonly known as: VENTOLIN HFA Inhale 2 puffs into the lungs every 6 (six) hours as needed for wheezing or shortness of breath.   allopurinol 300 MG tablet Commonly known as: ZYLOPRIM Take 1 tablet (300 mg total) by mouth daily. What changed: Another medication with the same name was removed. Continue taking this medication, and follow the directions you see here.   ALPRAZolam 0.25 MG tablet Commonly known as: XANAX Take 1 tablet (0.25 mg total) by mouth every 12 (twelve) hours as needed for up to 4 doses for anxiety.   Biofreeze Cool The Pain 4 % Gel Generic drug: Menthol (Topical Analgesic) Apply 1 Application topically in the morning, at noon, in the evening, and at bedtime. Apply to lower back 12/05-12/12   budesonide-formoterol 160-4.5 MCG/ACT inhaler Commonly known as: SYMBICORT Inhale 2 puffs into the lungs 2 (two)  times daily.   busPIRone 5 MG tablet Commonly known as: BUSPAR Take 5 mg by mouth 3 (three) times daily.   cholecalciferol 1000 units tablet Commonly known as: VITAMIN D Take 5,000 Units by mouth daily.   Dextromethorphan-Benzocaine 5-7.5 MG Lozg Take 1 lozenge by mouth every 2 (two) hours as needed.   DULoxetine 60 MG capsule Commonly known as: CYMBALTA Take 60 mg by mouth daily.   gabapentin 300 MG capsule Commonly known as: NEURONTIN Take 1 capsule (300 mg total) by mouth 3 (three) times daily. What changed: when to take this   guaifenesin 100 MG/5ML syrup Commonly known as: ROBITUSSIN Take 10 mLs by mouth every 6 (  six) hours as needed for cough.   Hypromellose 0.4 % Soln Apply 1 drop to eye 2 (two) times daily at 10 AM and 5 PM.   insulin glargine 100 UNIT/ML injection Commonly known as: LANTUS Inject 0.12 mLs (12 Units total) into the skin at bedtime. What changed: how much to take   insulin lispro 100 UNIT/ML KwikPen Commonly known as: HUMALOG 3 Units in the morning and at bedtime.   ipratropium-albuterol 0.5-2.5 (3) MG/3ML Soln Commonly known as: DUONEB Take 3 mLs by nebulization every 4 (four) hours as needed.   Jardiance 10 MG Tabs tablet Generic drug: empagliflozin Take 10 mg by mouth daily.   letrozole 2.5 MG tablet Commonly known as: Femara Take 1 tablet (2.5 mg total) by mouth daily.   lidocaine 5 % ointment Commonly known as: XYLOCAINE Apply 1 Application topically 3 (three) times daily as needed. Apply to left knee   metoprolol tartrate 50 MG tablet Commonly known as: LOPRESSOR Take 50 mg by mouth daily.   Refresh Liquigel 1 % Gel Generic drug: Carboxymethylcellulose Sodium Place 1 drop into both eyes at bedtime.   senna 8.6 MG tablet Commonly known as: SENOKOT Take 2 tablets by mouth 2 (two) times daily.   simvastatin 10 MG tablet Commonly known as: ZOCOR Take as needed for sign of fluid retention and weight gain of 3 pound weight  gain in 24 hours or 5 pounds in 1 week 2) shortness of breath What changed:  how much to take how to take this when to take this additional instructions   tiotropium 18 MCG inhalation capsule Commonly known as: SPIRIVA Place 18 mcg into inhaler and inhale daily.   venlafaxine XR 75 MG 24 hr capsule Commonly known as: EFFEXOR-XR Take 150 mg by mouth daily with breakfast.        Follow-up Information     Dale [place], MD Follow up in 1 week(s).   Contact information: [address] [place] [address] [phone]                Allergies  Allergen Reactions   Pantoprazole Sodium Diarrhea   Aleve [Naproxen Sodium] Swelling   Iron Nausea And Vomiting    "Oral Iron" per patient    The results of significant diagnostics from this hospitalization (including imaging, microbiology, ancillary and laboratory) are listed below for reference.    Microbiology: Recent Results (from the past 240 hour(s))  Urine Culture     Status: None   Collection Time: 05/11/23  1:46 AM   Specimen: Urine, Catheterized  Result Value Ref Range Status   Specimen Description   Final    URINE, CATHETERIZED Performed at [facility], [address] [place], [place] [address]    Special Requests   Final    NONE Performed at [facility], [address], [place], [place] [address]    Culture   Final    NO GROWTH Performed at [facility], [address], [place], [place] [address]    Report Status 05/12/2023 FINAL  Final  MRSA Next Gen by PCR, Nasal     Status: Abnormal   Collection Time: 05/12/23  7:00 PM   Specimen: Nasal Mucosa; Nasal Swab  Result Value Ref Range Status   MRSA by PCR Next Gen DETECTED (A) NOT DETECTED Final    Comment: CRITICAL RESULT CALLED TO, READ BACK BY AND VERIFIED WITH: Jaynie Crumble RN @ 2149 05/12/23 [facility] (NOTE) The GeneXpert MRSA Assay (FDA approved for  NASAL specimens only), is one component of a comprehensive MRSA  colonization surveillance program. It is not intended to diagnose MRSA infection nor to guide or monitor treatment for MRSA infections. Test performance is not FDA approved in patients less than 33 years old. Performed at [facility], [address] [place], [place] [address]     Procedures/Studies: MR Lumbar Spine W Wo Contrast  Result Date: 05/11/2023 CLINICAL DATA:  Low back pain EXAM: MRI LUMBAR SPINE WITHOUT AND WITH CONTRAST TECHNIQUE: Multiplanar and multiecho pulse sequences of the lumbar spine were obtained without and with intravenous contrast. CONTRAST:  10mL GADAVIST GADOBUTROL 1 MMOL/ML IV SOLN COMPARISON:  11/19/2007 FINDINGS: Segmentation:  Standard. Alignment:  Grade 1 anterolisthesis at L4-5. Vertebrae:  No fracture, evidence of discitis, or bone lesion. Conus medullaris and cauda equina: Conus extends to the L2 level. Conus and cauda equina appear normal. Paraspinal and other soft tissues: Fatty atrophy of the paraspinous musculature. Disc levels: T11-12: Small disc bulge without stenosis. T12-L1: Normal. L1-L2: Normal disc space and facet joints. No spinal canal stenosis. No neural foraminal stenosis. L2-L3: Small disc bulge. No spinal canal stenosis. No neural foraminal stenosis. L3-L4: Small disc bulge and mild facet hypertrophy. No spinal canal stenosis. Severe right neural foraminal stenosis. L4-L5: Moderate facet hypertrophy, right greater than left. Small disc bulge. No spinal canal stenosis. Mild left neural foraminal stenosis. L5-S1: Normal disc space and facet joints. No spinal canal stenosis. No neural foraminal stenosis. Visualized sacrum: Normal. IMPRESSION: 1. 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 2. Moderate L4-5 facet arthrosis with mild left neural foraminal stenosis. 3. No spinal canal stenosis. Electronically Signed   By: Deatra Robinson M.D.   On: 05/11/2023 01:02   CT ABDOMEN PELVIS W CONTRAST  Result Date: 05/10/2023 CLINICAL DATA:  Acute  nonlocalized abdominal pain. Severe back pain. X-ray done today found bulging discs and degenerative narrowing. EXAM: CT ABDOMEN AND PELVIS WITH CONTRAST TECHNIQUE: Multidetector CT imaging of the abdomen and pelvis was performed using the standard protocol following bolus administration of intravenous contrast. RADIATION DOSE REDUCTION: This exam was performed according to the departmental dose-optimization program which includes automated exposure control, adjustment of the mA and/or kV according to patient size and/or use of iterative reconstruction technique. CONTRAST:  75mL OMNIPAQUE IOHEXOL 300 MG/ML  SOLN COMPARISON:  PET-CT 06/29/2017 FINDINGS: Lower chest: Lung bases are clear.  Cardiac enlargement. Hepatobiliary: No focal liver lesions. Cholelithiasis with multiple gas-filled stones in the gallbladder. No inflammatory changes. No bile duct dilatation. Pancreas: Unremarkable. No pancreatic ductal dilatation or surrounding inflammatory changes. Spleen: Normal in size without focal abnormality. Adrenals/Urinary Tract: Adrenal glands are unremarkable. Kidneys are normal, without renal calculi, focal lesion, or hydronephrosis. Bladder is unremarkable. Stomach/Bowel: Stomach, small bowel, and colon are not abnormally distended. No wall thickening or inflammatory changes. Mild stool throughout the colon. Appendix is not identified. Vascular/Lymphatic: Aortic atherosclerosis. No enlarged abdominal or pelvic lymph nodes. Reproductive: Uterus and bilateral adnexa are unremarkable. Other: Moderate-sized periumbilical hernia containing fat. No free air or free fluid in the abdomen. Musculoskeletal: Moderate degenerative changes demonstrated in the lumbar spine. No acute bony abnormalities. Degenerative changes in the hips. Vacuum changes in the left SI joint likely degenerative. SI joints and symphysis pubis are not displaced. IMPRESSION: 1. Cholelithiasis without evidence of acute cholecystitis. 2. No bowel  obstruction or inflammation. 3. Aortic atherosclerosis. 4. Moderate periumbilical hernia containing fat. 5. Degenerative changes in the lumbar spine, SI joints, and hips. Electronically Signed   By: Burman Nieves  M.D.   On: 05/10/2023 23:03   CT L-SPINE NO CHARGE  Result Date: 05/10/2023 CLINICAL DATA:  Back trauma. Severe back pain. X-ray today found bulging disc and degenerative changes. EXAM: CT LUMBAR SPINE WITHOUT CONTRAST TECHNIQUE: Multidetector CT imaging of the lumbar spine was performed without intravenous contrast administration. Multiplanar CT image reconstructions were also generated. RADIATION DOSE REDUCTION: This exam was performed according to the departmental dose-optimization program which includes automated exposure control, adjustment of the mA and/or kV according to patient size and/or use of iterative reconstruction technique. COMPARISON:  MRI lumbar spine 11/19/2007 FINDINGS: Segmentation: 5 lumbar type vertebral bodies. Alignment: Normal alignment. Vertebrae: No vertebral compression deformities. No focal bone lesion or bone destruction. Bone cortex appears intact. Paraspinal and other soft tissues: No abnormal paraspinal soft tissue mass or infiltration. Mild fatty atrophy of the posterior paraspinal muscles. Disc levels: Degenerative changes throughout with disc space narrowing and endplate osteophyte formation. Degenerative disc disease at multiple levels. Degenerative changes in the posterior facet joints. There is likely diffuse bulging disc annulus and posterior facet joint and ligamentous hypertrophy causing moderate central canal stenosis at L3-4 and L4-5 levels. This appears progressed since the prior MRI although modality differences limit direct comparison. IMPRESSION: 1. No acute displaced fractures are identified. 2. Diffuse degenerative changes with evidence of at least moderate stenosis at L3-4 and L4-5. Electronically Signed   By: Burman Nieves M.D.   On: 05/10/2023  22:59    Labs: BNP (last 3 results) No results for input(s): "BNP" in the last 8760 hours. Basic Metabolic Panel: Recent Labs  Lab 05/10/23 2054 05/12/23 0457 05/13/23 1255 05/14/23 0452  NA 141 140 134* 135  K 3.9 4.3 4.2 4.3  CL 101 105 102 102  CO2 29 28 25 24   GLUCOSE 115* 102* 175* 122*  BUN 26* 23 38* 39*  CREATININE 1.45* 1.70* 1.86* 1.39*  CALCIUM 9.0 8.5* 8.3* 8.5*   Liver Function Tests: Recent Labs  Lab 05/10/23 2054  AST 17  ALT 15  ALKPHOS 74  BILITOT 0.8  PROT 7.1  ALBUMIN 3.6   Recent Labs  Lab 05/10/23 2054  LIPASE 28   No results for input(s): "AMMONIA" in the last 168 hours. CBC: Recent Labs  Lab 05/10/23 2054 05/12/23 0457  WBC 11.0* 9.9  NEUTROABS 8.7*  --   HGB 12.7 11.0*  HCT 38.7 34.6*  MCV 99.5 103.3*  PLT 217 204   Cardiac Enzymes: No results for input(s): "CKTOTAL", "CKMB", "CKMBINDEX", "TROPONINI" in the last 168 hours. BNP: Invalid input(s): "POCBNP" CBG: Recent Labs  Lab 05/14/23 1228 05/14/23 1648 05/14/23 2120 05/15/23 0828 05/15/23 1123  GLUCAP 94 124* 122* 94 128*  Urinalysis    Component Value Date/Time   COLORURINE YELLOW (A) 05/11/2023 0146   APPEARANCEUR CLEAR (A) 05/11/2023 0146   APPEARANCEUR Clear 11/01/2012 0935   LABSPEC 1.033 (H) 05/11/2023 0146   LABSPEC 1.021 11/01/2012 0935   PHURINE 6.0 05/11/2023 0146   GLUCOSEU >=500 (A) 05/11/2023 0146   GLUCOSEU Negative 11/01/2012 0935   HGBUR NEGATIVE 05/11/2023 0146   BILIRUBINUR NEGATIVE 05/11/2023 0146   BILIRUBINUR Negative 11/01/2012 0935   KETONESUR NEGATIVE 05/11/2023 0146   PROTEINUR NEGATIVE 05/11/2023 0146   NITRITE NEGATIVE 05/11/2023 0146   LEUKOCYTESUR NEGATIVE 05/11/2023 0146   LEUKOCYTESUR Negative 11/01/2012 0935   Sepsis Labs Recent Labs  Lab 05/10/23 2054 05/12/23 0457  WBC 11.0* 9.9   Microbiology Recent Results (from the past 240 hour(s))  Urine Culture     Status:  None   Collection Time: 05/11/23  1:46 AM   Specimen:  Urine, Catheterized  Result Value Ref Range Status   Specimen Description   Final    URINE, CATHETERIZED Performed at [facility], [address] [place], [place] [address]    Special Requests   Final    NONE Performed at [facility], [address] [place], [place] [address]    Culture   Final    NO GROWTH Performed at [facility], [address], [place], [place] [address]    Report Status 05/12/2023 FINAL  Final  MRSA Next Gen by PCR, Nasal     Status: Abnormal   Collection Time: 05/12/23  7:00 PM   Specimen: Nasal Mucosa; Nasal Swab  Result Value Ref Range Status   MRSA by PCR Next Gen DETECTED (A) NOT DETECTED Final    Comment: CRITICAL RESULT CALLED TO, READ BACK BY AND VERIFIED WITH: Jaynie Crumble RN @ 2149 05/12/23 [facility] (NOTE) The GeneXpert MRSA Assay (FDA approved for NASAL specimens only), is one component of a comprehensive MRSA colonization surveillance program. It is not intended to diagnose MRSA infection nor to guide or monitor treatment for MRSA infections. Test performance is not FDA approved in patients less than 10 years old. Performed at [facility], [address], [place], [place] [address]    Time coordinating discharge: 35 minutes  SIGNED: Lanae Boast, MD  [facility] 05/15/2023, 11:26 AM  If 7PM-7AM, please contact night-coverage [email or web address]

## 2023-05-15 NOTE — [place] FL2 (Signed)
[place] MEDICAID FL2 LEVEL OF CARE FORM     IDENTIFICATION  Patient Name: Melissa Mcdonald Birthdate: August 18, 1940 Sex: female Admission Date (Current Location): 05/10/2023  [facility] and [place] Number:  Chiropodist and Address:  [facility], [address], [place], [place] [address]      Provider Number: [id number]  Attending Physician Name and Address:  Lanae Boast, MD  Relative Name and Phone Number:  Casilda Carls  Daughter  Emergency Contact  [phone]    Current Level of Care: Hospital Recommended Level of Care: Nursing Facility Prior Approval Number:    Date Approved/Denied:   PASRR Number:    Discharge Plan: Other (Comment)    Current Diagnoses: Patient Active Problem List   Diagnosis Date Noted   AKI (acute kidney injury) (HCC) 05/12/2023   Lumbar foraminal stenosis 05/11/2023   Chronic obstructive pulmonary disease (COPD) (HCC) 05/11/2023   Chronic respiratory failure with hypoxia (HCC) 05/11/2023   Diabetes mellitus without complication (HCC) 05/11/2023   Acute on chronic combined systolic and diastolic CHF (congestive heart failure) (HCC) 05/11/2023   History of breast cancer 05/11/2023   S/P AKA (above knee amputation) (HCC) 05/11/2023   Intractable pain 05/11/2023   Diverticulosis 07/16/2019   Neuropathy 07/16/2019   Shortness of breath 07/26/2018   Localized edema 07/26/2018   Palliative care encounter 07/26/2018   Goals of care, counseling/discussion 08/21/2017   Acute respiratory failure with hypoxia (HCC) 07/30/2017   Sepsis (HCC) 07/30/2017   Influenza A 07/30/2017   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   Elevated uric acid in blood 07/28/2017   Hyperuricemia 07/17/2017   Abnormal CT of the head 06/08/2017   RUQ fullness 12/15/2016   Atypical chest pain 01/17/2016   Thyroid nodule 11/12/2015   Abnormal PET scan of mediastinum 11/03/2015   Parotid nodule 11/03/2015   Hypocalcemia  09/19/2015   Post-operative state 12/19/2013   Iron deficiency anemia 12/05/2013   Diverticulosis of colon 11/25/2013   History of total knee replacement 11/25/2013   Hypertension 11/25/2013   Migraine, unspecified, not intractable, without status migrainosus 11/25/2013   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/25/2013   Mononeuritis 11/25/2013   Other chronic pulmonary heart diseases 11/25/2013   Osteoarthritis 10/06/2013   Breast cancer (HCC) 01/08/2013   Above knee amputation status 09/23/2012   Sleep apnea 07/29/2012   Anemia 04/30/2012   Infection or inflammatory reaction due to internal joint prosthesis (HCC) 03/13/2012   Infection following a procedure, unspecified, initial encounter 12/14/2011   Lymphedema 11/01/2011   Pulmonary arterial hypertension (HCC) 2009    Orientation RESPIRATION BLADDER Height & Weight     Self, Time, Situation, Place  Normal Incontinent Weight: 54 kg Height:  5' (152.4 cm)  BEHAVIORAL SYMPTOMS/MOOD NEUROLOGICAL BOWEL NUTRITION STATUS      Incontinent Diet  AMBULATORY STATUS COMMUNICATION OF NEEDS Skin   Extensive Assist Verbally Normal                       Personal Care Assistance Level of Assistance              Functional Limitations Info  Sight, Hearing, Speech Sight Info: Adequate Hearing Info: Adequate Speech Info: Adequate    SPECIAL CARE FACTORS FREQUENCY                       Contractures Contractures Info: Not present    Additional Factors Info  Code Status, Allergies Code Status  Info: DNR Allergies Info: Pantoprazole Sodium, Aleve (Naproxen Sodium), Iron           Current Medications (05/15/2023):  This is the current hospital active medication list Current Facility-Administered Medications  Medication Dose Route Frequency Provider Last Rate Last Admin   acetaminophen (TYLENOL) tablet 650 mg  650 mg Oral Q6H PRN Manuela Schwartz, NP   650 mg at 05/13/23 2106   Or   acetaminophen (TYLENOL) suppository 650  mg  650 mg Rectal Q6H PRN Manuela Schwartz, NP   650 mg at 05/13/23 0755   allopurinol (ZYLOPRIM) tablet 300 mg  300 mg Oral Daily Lindajo Royal V, MD   300 mg at 05/15/23 0941   ALPRAZolam Prudy Feeler) tablet 0.25 mg  0.25 mg Oral Q12H PRN Andris Baumann, MD   0.25 mg at 05/11/23 1852   busPIRone (BUSPAR) tablet 5 mg  5 mg Oral TID Coralie Keens, MD   5 mg at 05/15/23 0941   DULoxetine (CYMBALTA) DR capsule 60 mg  60 mg Oral Daily Coralie Keens, MD   60 mg at 05/15/23 0941   enoxaparin (LOVENOX) injection 30 mg  30 mg Subcutaneous Q24H Lindajo Royal V, MD   30 mg at 05/15/23 1610   gabapentin (NEURONTIN) capsule 300 mg  300 mg Oral TID Coralie Keens, MD   300 mg at 05/15/23 0941   HYDROcodone-acetaminophen (NORCO/VICODIN) 5-325 MG per tablet 2 tablet  2 tablet Oral Q4H PRN Andris Baumann, MD   2 tablet at 05/14/23 0421   HYDROmorphone (DILAUDID) injection 1 mg  1 mg Intravenous Q2H PRN Arrien, York Ram, MD   1 mg at 05/12/23 1553   insulin aspart (novoLOG) injection 0-15 Units  0-15 Units Subcutaneous TID WC Andris Baumann, MD   2 Units at 05/13/23 1905   insulin aspart (novoLOG) injection 0-5 Units  0-5 Units Subcutaneous QHS Andris Baumann, MD       insulin glargine-yfgn [facility]) injection 10 Units  10 Units Subcutaneous Daily Andris Baumann, MD   10 Units at 05/15/23 0942   ipratropium-albuterol (DUONEB) 0.5-2.5 (3) MG/3ML nebulizer solution 3 mL  3 mL Nebulization Q4H PRN Andris Baumann, MD       letrozole [facility]) tablet 2.5 mg  2.5 mg Oral Daily Lindajo Royal V, MD   2.5 mg at 05/15/23 0942   lidocaine (LIDODERM) 5 % 1 patch  1 patch Transdermal Q24H Concha Se, MD   1 patch at 05/14/23 2030   methocarbamol (ROBAXIN) injection 500 mg  500 mg Intravenous Q6H PRN Andris Baumann, MD       metoprolol tartrate (LOPRESSOR) tablet 50 mg  50 mg Oral Daily Andris Baumann, MD   50 mg at 05/15/23 0941   mometasone-formoterol (DULERA) 200-5 MCG/ACT inhaler 2  puff  2 puff Inhalation BID Andris Baumann, MD   2 puff at 05/15/23 9604   mupirocin ointment (BACTROBAN) 2 % 1 Application  1 Application Nasal BID Coralie Keens, MD   1 Application at 05/15/23 0943   ondansetron [facility]) tablet 4 mg  4 mg Oral Q6H PRN Andris Baumann, MD       Or   ondansetron [facility]) injection 4 mg  4 mg Intravenous Q6H PRN Andris Baumann, MD         Discharge Medications: Please see discharge summary for a list of discharge medications.  Relevant Imaging Results:  Relevant Lab Results:   Additional Information SSN [id number]  Marlowe Sax, RN

## 2023-05-15 NOTE — Plan of Care (Signed)

## 2023-06-08 ENCOUNTER — Other Ambulatory Visit: Payer: Self-pay

## 2023-06-08 ENCOUNTER — Encounter: Payer: Self-pay | Admitting: Hematology and Oncology

## 2023-06-08 DIAGNOSIS — Z1231 Encounter for screening mammogram for malignant neoplasm of breast: Secondary | ICD-10-CM

## 2023-07-08 ENCOUNTER — Encounter: Payer: Self-pay | Admitting: Hematology and Oncology

## 2023-07-12 ENCOUNTER — Encounter: Payer: Self-pay | Admitting: Hematology and Oncology

## 2023-07-31 ENCOUNTER — Encounter: Payer: Self-pay | Admitting: Hematology and Oncology

## 2023-08-06 ENCOUNTER — Ambulatory Visit: Payer: Medicare Other | Admitting: Surgery

## 2023-08-20 ENCOUNTER — Inpatient Hospital Stay
Admission: EM | Admit: 2023-08-20 | Discharge: 2023-08-24 | DRG: 291 | Disposition: A | Discharge status: Skilled Nursing Facility | Source: Skilled Nursing Facility | Attending: Internal Medicine | Admitting: Internal Medicine

## 2023-08-20 ENCOUNTER — Encounter: Payer: Self-pay | Admitting: Family Medicine

## 2023-08-20 ENCOUNTER — Other Ambulatory Visit: Payer: Self-pay

## 2023-08-20 ENCOUNTER — Inpatient Hospital Stay

## 2023-08-20 ENCOUNTER — Emergency Department

## 2023-08-20 DIAGNOSIS — N390 Urinary tract infection, site not specified: Secondary | ICD-10-CM | POA: Diagnosis present

## 2023-08-20 DIAGNOSIS — I5043 Acute on chronic combined systolic (congestive) and diastolic (congestive) heart failure: Secondary | ICD-10-CM | POA: Diagnosis present

## 2023-08-20 DIAGNOSIS — N1832 Chronic kidney disease, stage 3b: Secondary | ICD-10-CM | POA: Diagnosis present

## 2023-08-20 DIAGNOSIS — I959 Hypotension, unspecified: Secondary | ICD-10-CM | POA: Diagnosis present

## 2023-08-20 DIAGNOSIS — J9621 Acute and chronic respiratory failure with hypoxia: Secondary | ICD-10-CM | POA: Diagnosis present

## 2023-08-20 DIAGNOSIS — J9601 Acute respiratory failure with hypoxia: Secondary | ICD-10-CM | POA: Diagnosis present

## 2023-08-20 DIAGNOSIS — Z79811 Long term (current) use of aromatase inhibitors: Secondary | ICD-10-CM

## 2023-08-20 DIAGNOSIS — I5033 Acute on chronic diastolic (congestive) heart failure: Secondary | ICD-10-CM

## 2023-08-20 DIAGNOSIS — E66813 Obesity, class 3: Secondary | ICD-10-CM | POA: Diagnosis present

## 2023-08-20 DIAGNOSIS — Z66 Do not resuscitate: Secondary | ICD-10-CM | POA: Diagnosis present

## 2023-08-20 DIAGNOSIS — Z7984 Long term (current) use of oral hypoglycemic drugs: Secondary | ICD-10-CM | POA: Diagnosis not present

## 2023-08-20 DIAGNOSIS — Z7951 Long term (current) use of inhaled steroids: Secondary | ICD-10-CM

## 2023-08-20 DIAGNOSIS — M48061 Spinal stenosis, lumbar region without neurogenic claudication: Secondary | ICD-10-CM | POA: Diagnosis present

## 2023-08-20 DIAGNOSIS — Z89612 Acquired absence of left leg above knee: Secondary | ICD-10-CM

## 2023-08-20 DIAGNOSIS — N183 Chronic kidney disease, stage 3 unspecified: Secondary | ICD-10-CM | POA: Diagnosis not present

## 2023-08-20 DIAGNOSIS — I272 Pulmonary hypertension, unspecified: Secondary | ICD-10-CM | POA: Diagnosis present

## 2023-08-20 DIAGNOSIS — B9620 Unspecified Escherichia coli [E. coli] as the cause of diseases classified elsewhere: Secondary | ICD-10-CM | POA: Diagnosis present

## 2023-08-20 DIAGNOSIS — Z6838 Body mass index (BMI) 38.0-38.9, adult: Secondary | ICD-10-CM

## 2023-08-20 DIAGNOSIS — E119 Type 2 diabetes mellitus without complications: Secondary | ICD-10-CM

## 2023-08-20 DIAGNOSIS — G9341 Metabolic encephalopathy: Secondary | ICD-10-CM | POA: Diagnosis present

## 2023-08-20 DIAGNOSIS — E1122 Type 2 diabetes mellitus with diabetic chronic kidney disease: Secondary | ICD-10-CM | POA: Diagnosis present

## 2023-08-20 DIAGNOSIS — I130 Hypertensive heart and chronic kidney disease with heart failure and stage 1 through stage 4 chronic kidney disease, or unspecified chronic kidney disease: Principal | ICD-10-CM | POA: Diagnosis present

## 2023-08-20 DIAGNOSIS — J449 Chronic obstructive pulmonary disease, unspecified: Secondary | ICD-10-CM | POA: Diagnosis present

## 2023-08-20 DIAGNOSIS — Z960 Presence of urogenital implants: Secondary | ICD-10-CM | POA: Diagnosis present

## 2023-08-20 DIAGNOSIS — I10 Essential (primary) hypertension: Secondary | ICD-10-CM | POA: Insufficient documentation

## 2023-08-20 DIAGNOSIS — C50919 Malignant neoplasm of unspecified site of unspecified female breast: Secondary | ICD-10-CM | POA: Diagnosis present

## 2023-08-20 DIAGNOSIS — G9340 Encephalopathy, unspecified: Secondary | ICD-10-CM | POA: Diagnosis not present

## 2023-08-20 DIAGNOSIS — Z853 Personal history of malignant neoplasm of breast: Secondary | ICD-10-CM

## 2023-08-20 LAB — BLOOD GAS, VENOUS

## 2023-08-20 LAB — URINALYSIS, COMPLETE (UACMP) WITH MICROSCOPIC
Bilirubin Urine: NEGATIVE
Glucose, UA: 150 mg/dL — AB
Hgb urine dipstick: NEGATIVE
Ketones, ur: NEGATIVE mg/dL
Nitrite: NEGATIVE
Protein, ur: NEGATIVE mg/dL
Specific Gravity, Urine: 1.009 (ref 1.005–1.030)
WBC, UA: >50 WBC/hpf (ref 0–5)
pH: 6 (ref 5.0–8.0)

## 2023-08-20 LAB — BASIC METABOLIC PANEL
Anion gap: 9 (ref 5–15)
BUN: 29 mg/dL — ABNORMAL HIGH (ref 8–23)
CO2: 31 mmol/L (ref 22–32)
Calcium: 8.6 mg/dL — ABNORMAL LOW (ref 8.9–10.3)
Chloride: 101 mmol/L (ref 98–111)
Creatinine, Ser: 1.74 mg/dL — ABNORMAL HIGH (ref 0.44–1.00)
GFR, Estimated: 29 mL/min — ABNORMAL LOW (ref >60)
Glucose, Bld: 119 mg/dL — ABNORMAL HIGH (ref 70–99)
Potassium: 4.4 mmol/L (ref 3.5–5.1)
Sodium: 141 mmol/L (ref 135–145)

## 2023-08-20 LAB — HEMOGLOBIN A1C
Hgb A1c MFr Bld: 6.5 % — ABNORMAL HIGH (ref 4.8–5.6)
Mean Plasma Glucose: 139.85 mg/dL

## 2023-08-20 LAB — CBC WITH DIFFERENTIAL/PLATELET
Abs Immature Granulocytes: 0.05 10*3/uL (ref 0.00–0.07)
Basophils Absolute: 0.1 10*3/uL (ref 0.0–0.1)
Basophils Relative: 0 %
Eosinophils Absolute: 0.6 10*3/uL — ABNORMAL HIGH (ref 0.0–0.5)
Eosinophils Relative: 5 %
HCT: 37.4 % (ref 36.0–46.0)
Hemoglobin: 11.7 g/dL — ABNORMAL LOW (ref 12.0–15.0)
Immature Granulocytes: 0 %
Lymphocytes Relative: 11 %
Lymphs Abs: 1.3 10*3/uL (ref 0.7–4.0)
MCH: 33.1 pg (ref 26.0–34.0)
MCHC: 31.3 g/dL (ref 30.0–36.0)
MCV: 105.6 fL — ABNORMAL HIGH (ref 80.0–100.0)
Monocytes Absolute: 0.7 10*3/uL (ref 0.1–1.0)
Monocytes Relative: 6 %
Neutro Abs: 8.8 10*3/uL — ABNORMAL HIGH (ref 1.7–7.7)
Neutrophils Relative %: 78 %
Platelets: 256 10*3/uL (ref 150–400)
RBC: 3.54 MIL/uL — ABNORMAL LOW (ref 3.87–5.11)
RDW: 14.6 % (ref 11.5–15.5)
WBC: 11.5 10*3/uL — ABNORMAL HIGH (ref 4.0–10.5)
nRBC: 0 % (ref 0.0–0.2)

## 2023-08-20 LAB — HEPATIC FUNCTION PANEL
ALT: 15 U/L (ref 0–44)
AST: 15 U/L (ref 15–41)
Albumin: 3.3 g/dL — ABNORMAL LOW (ref 3.5–5.0)
Alkaline Phosphatase: 66 U/L (ref 38–126)
Bilirubin, Direct: 0.2 mg/dL (ref 0.0–0.2)
Indirect Bilirubin: 0.3 mg/dL (ref 0.3–0.9)
Total Bilirubin: 0.5 mg/dL (ref 0.0–1.2)
Total Protein: 7 g/dL (ref 6.5–8.1)

## 2023-08-20 LAB — AMMONIA: Ammonia: 14 umol/L (ref 9–35)

## 2023-08-20 LAB — BRAIN NATRIURETIC PEPTIDE: B Natriuretic Peptide: 47.4 pg/mL (ref 0.0–100.0)

## 2023-08-20 LAB — GLUCOSE, CAPILLARY
Glucose-Capillary: 139 mg/dL — ABNORMAL HIGH (ref 70–99)
Glucose-Capillary: 116 mg/dL — ABNORMAL HIGH (ref 70–99)

## 2023-08-20 MED ORDER — INSULIN GLARGINE 100 UNIT/ML ~~LOC~~ SOLN
6.0000 [IU] | Freq: Every day | SUBCUTANEOUS | Status: DC
Start: 2023-08-20 — End: 2023-08-25
  Administered 2023-08-20 – 2023-08-23 (×4): 6 [IU] via SUBCUTANEOUS
  Filled 2023-08-20 (×5): qty 0.06

## 2023-08-20 MED ORDER — ONDANSETRON HCL 4 MG PO TABS
4.0000 mg | ORAL_TABLET | Freq: Four times a day (QID) | ORAL | Status: DC | PRN
Start: 2023-08-20 — End: 2023-08-25

## 2023-08-20 MED ORDER — INSULIN GLARGINE-YFGN 100 UNIT/ML ~~LOC~~ SOPN: 6.0000 [IU] | PEN_INJECTOR | Freq: Every day | SUBCUTANEOUS | Status: DC

## 2023-08-20 MED ORDER — ACETAMINOPHEN 325 MG PO TABS: 650.0000 mg | ORAL_TABLET | Freq: Four times a day (QID) | ORAL | Status: DC | PRN

## 2023-08-20 MED ORDER — FUROSEMIDE 20 MG PO TABS: 20.0000 mg | ORAL_TABLET | Freq: Every day | ORAL | Status: DC

## 2023-08-20 MED ORDER — MOMETASONE FURO-FORMOTEROL FUM 200-5 MCG/ACT IN AERO
2.0000 | INHALATION_SPRAY | Freq: Two times a day (BID) | RESPIRATORY_TRACT | Status: DC
  Administered 2023-08-20 – 2023-08-24 (×9): 2 via RESPIRATORY_TRACT
  Filled 2023-08-20: qty 8.8

## 2023-08-20 MED ORDER — FUROSEMIDE 10 MG/ML IJ SOLN
20.0000 mg | Freq: Once | INTRAMUSCULAR | Status: AC
  Administered 2023-08-20: 20 mg via INTRAVENOUS
  Filled 2023-08-20: qty 4

## 2023-08-20 MED ORDER — HYDROCODONE-ACETAMINOPHEN 7.5-325 MG PO TABS: 1.0000 | ORAL_TABLET | Freq: Four times a day (QID) | ORAL | Status: DC | PRN

## 2023-08-20 MED ORDER — SODIUM CHLORIDE 0.9% FLUSH
3.0000 mL | Freq: Two times a day (BID) | INTRAVENOUS | Status: DC
  Administered 2023-08-20 – 2023-08-24 (×8): 3 mL via INTRAVENOUS

## 2023-08-20 MED ORDER — POTASSIUM CHLORIDE CRYS ER 20 MEQ PO TBCR
20.0000 meq | EXTENDED_RELEASE_TABLET | Freq: Every day | ORAL | Status: DC
  Administered 2023-08-20 – 2023-08-24 (×5): 20 meq via ORAL
  Filled 2023-08-20 (×5): qty 1

## 2023-08-20 MED ORDER — SIMVASTATIN 20 MG PO TABS
10.0000 mg | ORAL_TABLET | Freq: Every day | ORAL | Status: DC
  Administered 2023-08-20 – 2023-08-23 (×4): 10 mg via ORAL
  Filled 2023-08-20 (×4): qty 1

## 2023-08-20 MED ORDER — SODIUM CHLORIDE 0.9 % IV SOLN: 250.0000 mL | INTRAVENOUS | Status: AC | PRN

## 2023-08-20 MED ORDER — SODIUM CHLORIDE 0.9% FLUSH: 3.0000 mL | INTRAVENOUS | Status: DC | PRN

## 2023-08-20 MED ORDER — BUSPIRONE HCL 10 MG PO TABS
5.0000 mg | ORAL_TABLET | Freq: Three times a day (TID) | ORAL | Status: DC
  Administered 2023-08-20 – 2023-08-24 (×13): 5 mg via ORAL
  Filled 2023-08-20 (×13): qty 1

## 2023-08-20 MED ORDER — ENOXAPARIN SODIUM 30 MG/0.3ML IJ SOSY
30.0000 mg | PREFILLED_SYRINGE | INTRAMUSCULAR | Status: DC
  Administered 2023-08-20 – 2023-08-22 (×3): 30 mg via SUBCUTANEOUS
  Filled 2023-08-20 (×3): qty 0.3

## 2023-08-20 MED ORDER — SODIUM CHLORIDE 0.9 % IV SOLN
2.0000 g | INTRAVENOUS | Status: DC
  Administered 2023-08-20 – 2023-08-23 (×4): 2 g via INTRAVENOUS
  Filled 2023-08-20 (×5): qty 20

## 2023-08-20 MED ORDER — ENOXAPARIN SODIUM 30 MG/0.3ML IJ SOSY: 30.0000 mg | PREFILLED_SYRINGE | INTRAMUSCULAR | Status: DC

## 2023-08-20 MED ORDER — TORSEMIDE 20 MG PO TABS
20.0000 mg | ORAL_TABLET | Freq: Every day | ORAL | Status: DC
  Administered 2023-08-21 – 2023-08-24 (×4): 20 mg via ORAL
  Filled 2023-08-20 (×5): qty 1

## 2023-08-20 MED ORDER — ONDANSETRON HCL 4 MG/2ML IJ SOLN
4.0000 mg | Freq: Four times a day (QID) | INTRAMUSCULAR | Status: DC | PRN
  Administered 2023-08-23: 4 mg via INTRAVENOUS
  Filled 2023-08-20: qty 2

## 2023-08-20 MED ORDER — ALLOPURINOL 100 MG PO TABS
300.0000 mg | ORAL_TABLET | Freq: Every day | ORAL | Status: DC
  Administered 2023-08-21: 300 mg via ORAL
  Filled 2023-08-20 (×2): qty 3

## 2023-08-20 MED ORDER — INSULIN ASPART 100 UNIT/ML IJ SOLN
0.0000 [IU] | Freq: Three times a day (TID) | INTRAMUSCULAR | Status: DC
  Administered 2023-08-21: 2 [IU] via SUBCUTANEOUS
  Administered 2023-08-22: 1 [IU] via SUBCUTANEOUS
  Administered 2023-08-23 – 2023-08-24 (×3): 2 [IU] via SUBCUTANEOUS
  Administered 2023-08-24: 1 [IU] via SUBCUTANEOUS
  Filled 2023-08-20 (×6): qty 1

## 2023-08-20 MED ORDER — GABAPENTIN 300 MG PO CAPS
300.0000 mg | ORAL_CAPSULE | Freq: Once | ORAL | Status: AC
  Administered 2023-08-20: 300 mg via ORAL
  Filled 2023-08-20: qty 1

## 2023-08-20 NOTE — H&P (Addendum)
 History and Physical    Patient: Melissa Mcdonald MVH:[id number] DOB: 12-27-1940 DOA: 08/20/2023 DOS: the patient was seen and examined on 08/20/2023 PCP: [facility]  Patient coming from: Home  Chief Complaint:  Chief Complaint  Patient presents with   Shortness of Breath   HPI: Melissa Mcdonald is a 83 y.o. 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  Limited history in setting of encephalopathy.  Per report, patient, from local nursing facility.  Per report, patient with shortness of breath.  Was placed on 7 L nasal cannula normally on 6 L.  No reports of falls, head trauma, fevers chills or loss conscious.  At present, patient denies any chest pain.  Noted baseline history of chronic HFpEF as well as chronic respiratory failure.  No reports of recent change in medication regimen.  No reports of high salt intake.  No reports of recent NSAID use.  Daughter does report recent ?MRSA infection status post course of antibiotics (?MRSA screen).  Daughter also reports prior episodes of confusion associated with infection in the past. Presented to the ER afebrile, hemodynamically stable.  Satting in mid 90s on 2 to 4 L at present.  White count 11.5, hemoglobin 11.7, platelets 256, creatinine 1.74.  BNP 47.  EKG sinus rhythm.  Chest x-ray with cardiomegaly.  Review of Systems: As mentioned in the history of present illness. All other systems reviewed and are negative. History reviewed. No pertinent past medical history. History reviewed. No pertinent surgical history. Social History:  reports that she has never smoked. She has never used smokeless tobacco. No history on file for alcohol use and drug use.  No Known  Allergies  History reviewed. No pertinent family history.  Prior to Admission medications   Medication Sig Start Date End Date Taking? Authorizing Provider  allopurinol (ZYLOPRIM) 100 MG tablet Take 200 mg by mouth daily. 05/10/23  Yes [provider]  allopurinol (ZYLOPRIM) 300 MG tablet Take 300 mg by mouth daily. 08/16/23  Yes [provider]  ALPRAZolam Prudy Feeler) 0.25 MG tablet Take 0.25 mg by mouth at bedtime as needed for anxiety or sleep. 08/16/23  Yes [provider]  amoxicillin-clavulanate (AUGMENTIN) 500-125 MG tablet Take 1 tablet by mouth 2 (two) times daily. 08/20/23  Yes [provider]  busPIRone (BUSPAR) 5 MG tablet Take 5 mg by mouth 3 (three) times daily. 08/16/23  Yes [provider]  cefTRIAXone (ROCEPHIN) 1 g injection Inject 1 g into the muscle daily. 08/20/23  Yes [provider]  Cholecalciferol (VITAMIN D3) 125 MCG (5000 UT) CAPS Take 1 capsule by mouth daily. 06/07/23  Yes [provider]  desonide (DESOWEN) 0.05 % cream Apply 1 Application topically 2 (two) times daily. 08/15/23  Yes [provider]  DULoxetine (CYMBALTA) 60 MG capsule Take 60 mg by mouth daily. 08/16/23  Yes [provider]  ENTRESTO 49-51 MG Take 1 tablet by mouth 2 (two) times daily. 08/16/23  Yes [provider]  fluticasone-salmeterol (ADVAIR) 250-50 MCG/ACT AEPB Inhale 1 puff into the lungs 2 (two) times daily. 07/15/23  Yes [provider]  gabapentin (NEURONTIN) 300 MG capsule Take 300 mg by mouth 3 (three) times daily. 08/16/23  Yes [provider]  guaifenesin (ROBITUSSIN) 100 MG/5ML syrup Take 200 mg by mouth 3 (  three) times daily as needed for cough.   Yes [provider]  HYDROcodone-acetaminophen (NORCO) 7.5-325 MG tablet Take 1 tablet by mouth every 6 (six) hours as needed for moderate pain (pain score 4-6) or severe pain (pain score 7-10). 04/24/23  Yes [provider]   hydrOXYzine (ATARAX) 10 MG tablet Take 10 mg by mouth every 4 (four) hours as needed. 08/13/23  Yes [provider]  INCRUSE ELLIPTA 62.5 MCG/ACT AEPB Inhale 1 puff into the lungs daily. 06/13/23  Yes [provider]  insulin glargine-yfgn (SEMGLEE) 100 UNIT/ML Pen Inject 12 Units into the skin at bedtime. 07/22/23  Yes [provider]  JARDIANCE 10 MG TABS tablet Take 10 mg by mouth daily. 08/16/23  Yes [provider]  letrozole (FEMARA) 2.5 MG tablet Take 2.5 mg by mouth daily. 08/04/23  Yes [provider]  lidocaine (XYLOCAINE) 1 % (with preservative) injection Inject 10 mLs into the skin once. 08/20/23  Yes [provider]  lidocaine (XYLOCAINE) 5 % ointment Apply 1 Application topically 3 (three) times daily as needed for mild pain (pain score 1-3). 08/09/23  Yes [provider]  metoprolol tartrate (LOPRESSOR) 50 MG tablet Take 50 mg by mouth daily. 08/16/23  Yes [provider]  REFRESH LIQUIGEL 1 % GEL Apply 1 drop to eye at bedtime. 05/15/23  Yes [provider]  senna (SENOKOT) 8.6 MG TABS tablet Take 2 tablets by mouth daily. 06/07/23  Yes [provider]  simvastatin (ZOCOR) 10 MG tablet Take 10 mg by mouth at bedtime. 08/16/23  Yes [provider]  sulfamethoxazole-trimethoprim (BACTRIM DS) 800-160 MG tablet Take 1 tablet by mouth 2 (two) times daily. 08/08/23  Yes [provider]  torsemide (DEMADEX) 20 MG tablet Take 20 mg by mouth daily. 08/16/23  Yes [provider]  venlafaxine XR (EFFEXOR-XR) 150 MG 24 hr capsule Take 150 mg by mouth daily. 08/16/23  Yes [provider]  albuterol (VENTOLIN HFA) 108 (90 Base) MCG/ACT inhaler Inhale 2 puffs into the lungs every 6 (six) hours as needed for wheezing or shortness of breath. Patient not taking: Reported on 08/20/2023 05/16/23   [provider]  fluconazole (DIFLUCAN) 150 MG tablet Take 150 mg by mouth once. Patient  not taking: Reported on 08/20/2023 08/13/23   [provider]    Physical Exam: Vitals:   08/20/23 1430 08/20/23 1500 08/20/23 1534 08/20/23 1602  BP: 104/75 (!) 121/95 (!) 121/95 92/75  Pulse: 70 75 73 69  Resp: (!) 23 (!) 21 (!) 22 20  Temp:   97.7 F (36.5 C) 98.3 F (36.8 C)  TempSrc:    Oral  SpO2: 95% 94% 95% 96%  Height:   5' (1.524 m)    Physical Exam Constitutional:      Appearance: She is obese.  HENT:     Head: Normocephalic.     Nose: Nose normal.     Mouth/Throat:     Mouth: Mucous membranes are moist.  Eyes:     Pupils: Pupils are equal, round, and reactive to light.  Cardiovascular:     Rate and Rhythm: Normal rate and regular rhythm.  Pulmonary:     Effort: Pulmonary effort is normal.  Abdominal:     General: Bowel sounds are normal.  Musculoskeletal:     Comments: Status post left AKA  Skin:    General: Skin is warm.  Neurological:     General: No focal deficit present.     Comments: Mild  generalized confusion  Psychiatric:        Mood and Affect: Mood normal.     Data Reviewed:  There are no new results to review at this time.  DG Chest Portable 1 View CLINICAL DATA:  Shortness of breath.  Edema.  EXAM: PORTABLE CHEST 1 VIEW  COMPARISON:  X-ray 07/30/2017.  FINDINGS: Enlarged cardiopericardial silhouette with vascular congestion. No pneumothorax or effusion. No consolidation. Prominent central vasculature. No edema. Films are under penetrated underinflated. Overlapping cardiac leads. Degenerative changes of the spine.  IMPRESSION: Enlarged cardiopericardial silhouette with some vascular congestion. Limited radiograph.  Electronically Signed   By: Karen Kays M.D.   On: 08/20/2023 13:50  Lab Results  Component Value Date   WBC 11.5 (H) 08/20/2023   HGB 11.7 (L) 08/20/2023   HCT 37.4 08/20/2023   MCV 105.6 (H) 08/20/2023   PLT 256 08/20/2023   Last metabolic panel Lab Results  Component Value Date   GLUCOSE 119  (H) 08/20/2023   NA 141 08/20/2023   K 4.4 08/20/2023   CL 101 08/20/2023   CO2 31 08/20/2023   BUN 29 (H) 08/20/2023   CREATININE 1.74 (H) 08/20/2023   GFRNONAA 29 (L) 08/20/2023   CALCIUM 8.6 (L) 08/20/2023   ANIONGAP 9 08/20/2023    Assessment and Plan: * Acute on chronic respiratory failure with hypoxia (HCC) Acute on chronic HFpEF (2D echo November 2019 with EF of 60 to 65%) Pulmonary hypertension Decompensated respiratory status now requiring 2 L nasal cannula in the setting of acute on chronic HFpEF and pulmonary hypertension Chest x-ray with cardiomegaly BNP within normal limits Will gently diurese patient in the setting of hypotension 2D echo Noted baseline COPD Does not appear to be in acute exacerbation Strict ins and outs and daily weights Monitor Cardiology consultation as clinically indicated   Encephalopathy Mild generalized confusion on presentation Suspect likely multifactorial in the setting of decompensated respiratory failure on 2 L No overt infection noted at present Will check VBG CT head x 1 Chest x-ray and UA Will otherwise monitor with gentle diuresis Follow and reassess as appropriate  Lumbar foraminal stenosis Noted baseline history of chronic back pain Minimize sedating medications where possible in the setting of encephalopathy Monitor  CKD (chronic kidney disease), stage III (HCC) Creatinine 1.7 with GFR in the upper 20s to 30s Appears near baseline in review of prior labs (pending chart merging) Monitor with diuresis  History of breast cancer Continue letrozole  Hypertension Systolic pressures 90s to 110s on presentation Hold antihypertensive regimen in the setting of active diuresis Monitor  Type 2 diabetes mellitus (HCC) Blood sugar 110s SSI A1c Monitor  COPD (chronic obstructive pulmonary disease) (HCC) Baseline COPD Does not appear to be in acute exacerbation in setting of decompensated respiratory failure Continue  home inhalers and DuoNebs Monitor      Advance Care Planning:   Code Status: Limited: Do not attempt resuscitation (DNR) -DNR-LIMITED -Do Not Intubate/DNI    Consults: None   Family Communication: Case dicussed w/ daughter over the phone   Severity of Illness: The appropriate patient status for this patient is OBSERVATION. Observation status is judged to be reasonable and necessary in order to provide the required intensity of service to ensure the patient's safety. The patient's presenting symptoms, physical exam findings, and initial radiographic and laboratory data in the context of their medical condition is felt to place them at decreased risk for further clinical deterioration. Furthermore, it is anticipated that the patient will be medically  stable for discharge from the hospital within 2 midnights of admission.   Author: Floydene Flock, MD 08/20/2023 4:59 PM  For on call review [email or web address].

## 2023-08-20 NOTE — Assessment & Plan Note (Addendum)
 Mild generalized confusion on presentation Suspect likely multifactorial in the setting of decompensated respiratory failure on 2 L No overt infection noted at present Will check VBG CT head x 1 Chest x-ray and UA Will otherwise monitor with gentle diuresis Follow and reassess as appropriate

## 2023-08-20 NOTE — Assessment & Plan Note (Addendum)
 Acute on chronic HFpEF (2D echo November 2019 with EF of 60 to 65%) Pulmonary hypertension Decompensated respiratory status now requiring 2 L nasal cannula in the setting of acute on chronic HFpEF and pulmonary hypertension Chest x-ray with cardiomegaly BNP within normal limits Will gently diurese patient in the setting of hypotension 2D echo Noted baseline COPD Does not appear to be in acute exacerbation Strict ins and outs and daily weights Monitor Cardiology consultation as clinically indicated

## 2023-08-20 NOTE — Assessment & Plan Note (Signed)
 Baseline COPD Does not appear to be in acute exacerbation in setting of decompensated respiratory failure Continue home inhalers and DuoNebs Monitor

## 2023-08-20 NOTE — ED Provider Notes (Signed)
 [facility] Provider Note    Event Date/Time   First MD Initiated Contact with Patient 08/20/23 1122     (approximate)   History   Shortness of Breath   HPI  Melissa Mcdonald is a 83 y.o. female with a history of chronic respiratory failure reportedly on 6 L nasal cannula at facility presents to the ER for report of shortness of breath.  Patient denies any complaints does not feel short of breath.  She is not sure as to why she was brought to the hospital.  She does have signed DNR as well as MOST form.  Does have indwelling urinary catheter.     Physical Exam   Triage Vital Signs: ED Triage Vitals  Encounter Vitals Group     BP      Systolic BP Percentile      Diastolic BP Percentile      Pulse      Resp      Temp      Temp src      SpO2      Weight      Height      Head Circumference      Peak Flow      Pain Score      Pain Loc      Pain Education      Exclude from Growth Chart     Most recent vital signs: Vitals:   08/20/23 1300 08/20/23 1315  BP: 91/72   Pulse: 63   Resp: (!) 23   Temp:    SpO2: 95% 93%     Constitutional: Alert  Eyes: Conjunctivae are normal.  Head: Atraumatic. Nose: No congestion/rhinnorhea. Mouth/Throat: Mucous membranes are moist.   Neck: Painless ROM.  Cardiovascular:   Good peripheral circulation. Respiratory: No significant increased work of breathing.  No retractions. Gastrointestinal: Soft and nontender.  Musculoskeletal:  no deformity Neurologic:  MAE spontaneously. No gross focal neurologic deficits are appreciated.  Skin:  Skin is warm, dry and intact. No rash noted. Psychiatric: Mood and affect are normal. Speech and behavior are normal.    ED Results / Procedures / Treatments   Labs (all labs ordered are listed, but only abnormal results are displayed) Labs Reviewed  CBC WITH DIFFERENTIAL/PLATELET - Abnormal; Notable for the following components:      Result Value   WBC 11.5 (*)     RBC 3.54 (*)    Hemoglobin 11.7 (*)    MCV 105.6 (*)    Neutro Abs 8.8 (*)    Eosinophils Absolute 0.6 (*)    All other components within normal limits  BASIC METABOLIC PANEL - Abnormal; Notable for the following components:   Glucose, Bld 119 (*)    BUN 29 (*)    Creatinine, Ser 1.74 (*)    Calcium 8.6 (*)    GFR, Estimated 29 (*)    All other components within normal limits  BRAIN NATRIURETIC PEPTIDE     EKG  ED ECG REPORT I, Willy Eddy, the attending physician, personally viewed and interpreted this ECG.   Date: 08/20/2023  EKG Time: 12:03  Rate: 60  Rhythm: sinus  Axis: normal  Intervals: normal  ST&T Change: no stemi, no depressions    RADIOLOGY Please see ED Course for my review and interpretation.  I personally reviewed all radiographic images ordered to evaluate for the above acute complaints and reviewed radiology reports and findings.  These findings were personally discussed with the patient.  Please see medical record for radiology report.    PROCEDURES:  Critical Care performed: Yes, see critical care procedure note(s)  Procedures   MEDICATIONS ORDERED IN ED: Medications  furosemide (LASIX) injection 20 mg (has no administration in time range)  gabapentin (NEURONTIN) capsule 300 mg (300 mg Oral Given 08/20/23 1258)     IMPRESSION / MDM / ASSESSMENT AND PLAN / ED COURSE  I reviewed the triage vital signs and the nursing notes.                              Differential diagnosis includes, but is not limited to, Asthma, copd, CHF, pna, ptx, malignancy, Pe, anemia  Patient presenting to the ER for evaluation of symptoms as described above.  Based on symptoms, risk factors and considered above differential, this presenting complaint could reflect a potentially life-threatening illness therefore the patient will be placed on continuous pulse oximetry and telemetry for monitoring.  Laboratory evaluation will be sent to evaluate for the above  complaints.  Clinically the patient does appear moderately volume overloaded.  Doubt sepsis no fever.  No cough.  Will try to wean oxygen.  Will workup for the but differential.  Have a lower suspicion for PE or malignancy.  She does appear pale may be anemia.  Do not appreciate much significant wheezing on exam.   Clinical Course as of 08/20/23 1434  Mon Aug 20, 2023  1359 Chest x-ray on my review and interpretation with findings concerning for vascular congestion and pulmonary edema.  Family at bedside reported the patient does have a history of congestive heart failure and reiterating the patient does not wear home oxygen.  She has been weaned down to 4 L nasal cannula.  Does not appear to require BiPAP.  Will consult hospitalist for admission. [PR]    Clinical Course User Index [PR] Willy Eddy, MD     FINAL CLINICAL IMPRESSION(S) / ED DIAGNOSES   Final diagnoses:  Acute respiratory failure with hypoxia (HCC)     Rx / DC Orders   ED Discharge Orders     None        Note:  This document was prepared using Dragon voice recognition software and may include unintentional dictation errors.    Willy Eddy, MD 08/20/23 1434

## 2023-08-20 NOTE — Assessment & Plan Note (Signed)
 Noted baseline history of chronic back pain Minimize sedating medications where possible in the setting of encephalopathy Monitor

## 2023-08-20 NOTE — Assessment & Plan Note (Signed)
 Systolic pressures 90s to 110s on presentation Hold antihypertensive regimen in the setting of active diuresis Monitor

## 2023-08-20 NOTE — Assessment & Plan Note (Signed)
 Creatinine 1.7 with GFR in the upper 20s to 30s Appears near baseline in review of prior labs (pending chart merging) Monitor with diuresis

## 2023-08-20 NOTE — Progress Notes (Signed)
 Jury Duty Note for [employer]:   To whom it may concern,   The mother of Casilda Carls [facility]) is currently admitted to the hospital for issues including acute respiratory failure requiring oxygen, acute heart failure, acute confusion.  Casilda Carls is the primary caregiver for Ms. Spadafora and will need to remain in near the bedside while hospitalized.  If there are any questions or concerns please feel free to contact me via email.  Sincerely,   Floydene Flock MD   [email or web address]

## 2023-08-20 NOTE — Assessment & Plan Note (Signed)
 Blood sugar 110s SSI A1c Monitor

## 2023-08-20 NOTE — Assessment & Plan Note (Signed)
 Continue letrozole.

## 2023-08-20 NOTE — Progress Notes (Signed)
 UA indicative of infection w/ encephalopathy and WBC 11.5  Will place on IV rocephin  Urine culture  Reassess as appropriate

## 2023-08-20 NOTE — Progress Notes (Signed)
 PT REFUSING FACILITY CPAP USE AT THIS TIME. RT WILL ASSIST WHEN NEEDED.

## 2023-08-20 NOTE — ED Triage Notes (Signed)
 Pt arrives via [facility] from [facility] for dyspnea. Per staff, pt was short of breath overnight and placed on oxygen 7lpm. Pt is normally on 6lpm. Pt arrives on 6lpm via [place] and does not appear to be in distress. Pt able to speak in complete sentences without difficulty.

## 2023-08-21 ENCOUNTER — Inpatient Hospital Stay (HOSPITAL_COMMUNITY)
Admit: 2023-08-21 | Discharge: 2023-08-21 | Disposition: A | Discharge status: Home / Self Care | Attending: Family Medicine | Admitting: Family Medicine

## 2023-08-21 ENCOUNTER — Inpatient Hospital Stay

## 2023-08-21 DIAGNOSIS — I5033 Acute on chronic diastolic (congestive) heart failure: Secondary | ICD-10-CM | POA: Diagnosis not present

## 2023-08-21 DIAGNOSIS — J9621 Acute and chronic respiratory failure with hypoxia: Secondary | ICD-10-CM | POA: Diagnosis not present

## 2023-08-21 LAB — ECHOCARDIOGRAM COMPLETE
AR max vel: 2.37 cm2
AV Area VTI: 2.23 cm2
AV Area mean vel: 2.25 cm2
AV Mean grad: 6 mmHg
AV Peak grad: 12.1 mmHg
Ao pk vel: 1.74 m/s
Area-P 1/2: 4.44 cm2
Calc EF: 53.2 %
Height: 60 in
MV VTI: 2.63 cm2
S' Lateral: 2.1 cm
Single Plane A2C EF: 53 %
Single Plane A4C EF: 57.9 %
Weight: 3150.4 [oz_av]

## 2023-08-21 LAB — CBC
HCT: 34.1 % — ABNORMAL LOW (ref 36.0–46.0)
Hemoglobin: 11.1 g/dL — ABNORMAL LOW (ref 12.0–15.0)
MCH: 33.1 pg (ref 26.0–34.0)
MCHC: 32.6 g/dL (ref 30.0–36.0)
MCV: 101.8 fL — ABNORMAL HIGH (ref 80.0–100.0)
Platelets: 246 10*3/uL (ref 150–400)
RBC: 3.35 MIL/uL — ABNORMAL LOW (ref 3.87–5.11)
RDW: 14.6 % (ref 11.5–15.5)
WBC: 11 10*3/uL — ABNORMAL HIGH (ref 4.0–10.5)
nRBC: 0 % (ref 0.0–0.2)

## 2023-08-21 LAB — COMPREHENSIVE METABOLIC PANEL
ALT: 15 U/L (ref 0–44)
AST: 15 U/L (ref 15–41)
Albumin: 3.1 g/dL — ABNORMAL LOW (ref 3.5–5.0)
Alkaline Phosphatase: 56 U/L (ref 38–126)
Anion gap: 9 (ref 5–15)
BUN: 28 mg/dL — ABNORMAL HIGH (ref 8–23)
CO2: 27 mmol/L (ref 22–32)
Calcium: 8.6 mg/dL — ABNORMAL LOW (ref 8.9–10.3)
Chloride: 102 mmol/L (ref 98–111)
Creatinine, Ser: 1.61 mg/dL — ABNORMAL HIGH (ref 0.44–1.00)
GFR, Estimated: 32 mL/min — ABNORMAL LOW (ref >60)
Glucose, Bld: 101 mg/dL — ABNORMAL HIGH (ref 70–99)
Potassium: 3.6 mmol/L (ref 3.5–5.1)
Sodium: 138 mmol/L (ref 135–145)
Total Bilirubin: 0.8 mg/dL (ref 0.0–1.2)
Total Protein: 6.4 g/dL — ABNORMAL LOW (ref 6.5–8.1)

## 2023-08-21 LAB — BLOOD GAS, VENOUS
Acid-Base Excess: 7.4 mmol/L — ABNORMAL HIGH (ref 0.0–2.0)
Bicarbonate: 34.7 mmol/L — ABNORMAL HIGH (ref 20.0–28.0)
O2 Saturation: 36.5 %
Patient temperature: 37
pCO2, Ven: 60 mmHg (ref 44–60)
pH, Ven: 7.37 (ref 7.25–7.43)

## 2023-08-21 LAB — GLUCOSE, CAPILLARY
Glucose-Capillary: 103 mg/dL — ABNORMAL HIGH (ref 70–99)
Glucose-Capillary: 158 mg/dL — ABNORMAL HIGH (ref 70–99)
Glucose-Capillary: 113 mg/dL — ABNORMAL HIGH (ref 70–99)
Glucose-Capillary: 103 mg/dL — ABNORMAL HIGH (ref 70–99)
Glucose-Capillary: 108 mg/dL — ABNORMAL HIGH (ref 70–99)

## 2023-08-21 MED ORDER — MIDODRINE HCL 5 MG PO TABS
5.0000 mg | ORAL_TABLET | Freq: Three times a day (TID) | ORAL | Status: AC
  Administered 2023-08-21 (×2): 5 mg via ORAL
  Filled 2023-08-21 (×2): qty 1

## 2023-08-21 MED ORDER — MIDODRINE HCL 5 MG PO TABS
5.0000 mg | ORAL_TABLET | Freq: Three times a day (TID) | ORAL | Status: DC
  Administered 2023-08-21 – 2023-08-22 (×4): 5 mg via ORAL
  Filled 2023-08-21 (×4): qty 1

## 2023-08-21 MED ORDER — SODIUM CHLORIDE 0.9 % IV BOLUS
250.0000 mL | Freq: Once | INTRAVENOUS | Status: AC
  Administered 2023-08-21: 250 mL via INTRAVENOUS

## 2023-08-21 NOTE — Progress Notes (Signed)
 MEWS Progress Note  Patient Details Name: Melissa Mcdonald MRN: [id number] DOB: [birth year] Today's Date: 08/21/2023   MEWS Flowsheet Documentation:  Assess: MEWS Score Temp: 98.6 F (37 C) BP: (!) 80/42 MAP (mmHg): 78 Pulse Rate: 69 ECG Heart Rate: 74 Resp: 19 Level of Consciousness: Alert SpO2: 96 % O2 Device: Room Air O2 Flow Rate (L/min): 2.5 L/min Assess: MEWS Score MEWS Temp: 0 MEWS Systolic: 2 MEWS Pulse: 0 MEWS RR: 0 MEWS LOC: 0 MEWS Score: 2 MEWS Score Color: Yellow Assess: SIRS CRITERIA SIRS Temperature : 0 SIRS Respirations : 0 SIRS Pulse: 0 SIRS WBC: 0 SIRS Score Sum : 0 SIRS Temperature : 0 SIRS Pulse: 0 SIRS Respirations : 0 SIRS WBC: 0 SIRS Score Sum : 0 Assess: if the MEWS score is Yellow or Red Were vital signs accurate and taken at a resting state?: Yes Does the patient meet 2 or more of the SIRS criteria?: No MEWS guidelines implemented : Yes, yellow Treat MEWS Interventions: Considered administering scheduled or prn medications/treatments as ordered Take Vital Signs Increase Vital Sign Frequency : Yellow: Q2hr x1, continue Q4hrs until patient remains green for 12hrs Escalate MEWS: Escalate: Yellow: Discuss with charge nurse and consider notifying provider and/or RRT Notify: Charge Nurse/RN Name of Charge Nurse/RN Notified: Lupita Leash, RN Provider Notification Provider Name/Title: Lindajo Royal, MD Date Provider Notified: 08/21/23 Time Provider Notified: 0005 Method of Notification: Page Notification Reason: Change in status (MEWS yellow, BP 80/42 manual endorsing lightheadedness)      Ernest Mallick 08/21/2023, 12:07 AM

## 2023-08-21 NOTE — Evaluation (Signed)
 Physical Therapy Evaluation Patient Details Name: Melissa Mcdonald MRN: [id number] DOB: [birth year] Today's Date: 08/21/2023  History of Present Illness  Pt is an 83 y.o. female presenting to hospital 08/20/23 with c/o SOB.  Pt admitted with acute on chronic respiratory failure with hypoxia, acute on chronic HFpEF, pulmonary htn, encephalopathy.  PMH includes chronic respiratory failure (reportedly on 6L [place] at facility), pulmonary htn, diastolic CHF, CKD stage 3-4, follicular lymphoma, DM, gout, L AKA, h/o breast CA.  Clinical Impression  Prior to recent medical concerns, pt reports living at a facility; staff using hoyer lift 1x/week to get pt to w/c to go to hair appt; otherwise is bed-bound but able to perform bed mobility (logrolling) on own.  No family present during session to verify information but daughter present in afternoon and verified PLOF information; daughter also reports that pt fell OOB about 2 weeks ago (d/t being confused from UTI and thought she had both legs to use; pt's daughter also reports h/o bone being taken out of R LE from above knee to below knee d/t infection).  Pt oriented to name (not DOB), hospital, and March (not year); increased time noted for pt to process and verbalize during session.  Currently pt is mod assist with logrolling L/R in bed using bed rails; able to come to long sitting (with HOB elevated) on own with use of bed rails.  Pt would currently benefit from skilled PT to address noted impairments and functional limitations (see below for any additional details).  Upon hospital discharge, no further PT needs anticipated.       If plan is discharge home, recommend the following: Two people to help with walking and/or transfers;Assistance with cooking/housework;Assist for transportation;Help with stairs or ramp for entrance   Can travel by private vehicle        Equipment Recommendations None recommended by PT;Other (comment) (Pt has needed DME at facility  already)  Recommendations for Other Services       Functional Status Assessment Patient has had a recent decline in their functional status and/or demonstrates limited ability to make significant improvements in function in a reasonable and predictable amount of time     Precautions / Restrictions Precautions Precautions: Fall Restrictions Weight Bearing Restrictions Per Provider Order: No      Mobility  Bed Mobility Overal bed mobility: Needs Assistance Bed Mobility: Rolling Rolling: Mod assist, Used rails         General bed mobility comments: logrolling L/R in bed; use of bed rail; assist at hips to logroll (pt able to reach with UE's for bed rails)    Transfers                   General transfer comment: Deferred (pt reports using hoyer lift at baseline)    Ambulation/Gait                  Stairs            Wheelchair Mobility     Tilt Bed    Modified Rankin (Stroke Patients Only)       Balance Overall balance assessment:  (Pt able to come to long-sitting in bed with use of bed rails; good sitting balance)                                           Pertinent Vitals/Pain Pain Assessment Pain  Assessment: No/denies pain Vitals (HR and SpO2 on room air) stable throughout session; SpO2 sats 92% or greater on room air.    Home Living Family/patient expects to be discharged to:: Assisted living                 Home Equipment: Wheelchair - manual (hoyer lift) Additional Comments: Pt reports living at a facility (for past 2 years) but unsure of the name.  Pt reports using CPAP at bedtime but no O2 during the day.    Prior Function Prior Level of Function : Needs assist             Mobility Comments: Pt reports mostly being bed bound but staff use hoyer lift 1x/week to get pt OOB to manual w/c so pt can go to her hair appt (pt reports pushing w/c on own with R LE and B UE's); pt reports normally being able to  logroll L/R in bed modified independently ADLs Comments: Pt reports she uses diaper for toileting (staff changes her/provides peri-care)     Extremity/Trunk Assessment   Upper Extremity Assessment Upper Extremity Assessment: Generalized weakness    Lower Extremity Assessment Lower Extremity Assessment: LLE deficits/detail;RLE deficits/detail;Generalized weakness RLE Deficits / Details: pt keeps R LE in flexed position; R DF to neutral PROM; able to get R knee extension to almost neutral; R knee flexion WFL; R LE appearing weak in general LLE Deficits / Details: AKA       Communication   Communication Communication: Impaired Factors Affecting Communication: Difficulty expressing self (Increased time to respond verbally and process cues/information in general)    Cognition Arousal: Alert Behavior During Therapy: WFL for tasks assessed/performed   PT - Cognitive impairments: No family/caregiver present to determine baseline                       PT - Cognition Comments: Pt oriented to name (not DOB), hospital, and March (but not year); pt reported she did not know why she was in the hospital Following commands: Impaired Following commands impaired: Follows one step commands with increased time, Follows one step commands inconsistently     Cueing Cueing Techniques: Verbal cues, Tactile cues, Visual cues     General Comments  Nursing cleared pt for participation in physical therapy.  Pt agreeable to PT session.    Exercises     Assessment/Plan    PT Assessment Patient needs continued PT services  PT Problem List Decreased strength       PT Treatment Interventions Functional mobility training;Therapeutic activities;Therapeutic exercise;Patient/family education    PT Goals (Current goals can be found in the Care Plan section)  Acute Rehab PT Goals Patient Stated Goal: to improve strength for bed mobility PT Goal Formulation: With patient Time For Goal  Achievement: 09/04/23 Potential to Achieve Goals: Good    Frequency Min 1X/week     Co-evaluation               AM-PAC PT "6 Clicks" Mobility  Outcome Measure Help needed turning from your back to your side while in a flat bed without using bedrails?: A Lot Help needed moving from lying on your back to sitting on the side of a flat bed without using bedrails?: A Lot Help needed moving to and from a bed to a chair (including a wheelchair)?: Total Help needed standing up from a chair using your arms (e.g., wheelchair or bedside chair)?: Total Help needed to walk in hospital room?: Total  Help needed climbing 3-5 steps with a railing? : Total 6 Click Score: 8    End of Session   Activity Tolerance: Patient tolerated treatment well Patient left: in bed;with call bell/phone within reach;with bed alarm set Nurse Communication: Mobility status;Precautions PT Visit Diagnosis: Muscle weakness (generalized) (M62.81)    Time: 2956-2130 PT Time Calculation (min) (ACUTE ONLY): 24 min   Charges:   PT Evaluation $PT Eval Low Complexity: 1 Low   PT General Charges $$ ACUTE PT VISIT: 1 Visit        Hendricks Limes, PT 08/21/23, 10:41 AM

## 2023-08-21 NOTE — Plan of Care (Signed)

## 2023-08-21 NOTE — TOC Initial Note (Signed)
 Transition of Care [facility]) - Initial/Assessment Note    Patient Details  Name: Melissa Mcdonald MRN: [id number] Date of Birth: December 30, 1940  Transition of Care [facility]) CM/SW Contact:    Cherre Blanc, RN Phone Number: 08/21/2023, 2:15 PM  Clinical Narrative:                 Patient is from [facility]. The patient and daughter agree that she will return to [facility] when she is medically clear for dc. The patient is a bilateral amputee and uses a wheelchair. TOC will continue to follow for dc needs.  Expected Discharge Plan: Skilled Nursing Facility Barriers to Discharge: Continued Medical Work up   Patient Goals and CMS Choice            Expected Discharge Plan and Services       Living arrangements for the past 2 months: Skilled Nursing Facility                                      Prior Living Arrangements/Services Living arrangements for the past 2 months: Skilled Nursing Facility Lives with:: Facility Resident                   Activities of Daily Living   ADL Screening (condition at time of admission) Independently performs ADLs?: No Does the patient have a NEW difficulty with bathing/dressing/toileting/self-feeding that is expected to last >3 days?: No Does the patient have a NEW difficulty with getting in/out of bed, walking, or climbing stairs that is expected to last >3 days?: No Does the patient have a NEW difficulty with communication that is expected to last >3 days?: No Is the patient deaf or have difficulty hearing?: Yes Does the patient have difficulty seeing, even when wearing glasses/contacts?: No Does the patient have difficulty concentrating, remembering, or making decisions?: No  Permission Sought/Granted                  Emotional Assessment Appearance:: Appears stated age Attitude/Demeanor/Rapport: Engaged Affect (typically observed): Accepting, Appropriate Orientation: : Oriented to Self, Oriented to Place, Oriented to   Time, Oriented to Situation   Psych Involvement: No (comment)  Admission diagnosis:  Acute respiratory failure with hypoxia (HCC) [J96.01] Patient Active Problem List   Diagnosis Date Noted   Acute on chronic respiratory failure with hypoxia (HCC) 08/20/2023   COPD (chronic obstructive pulmonary disease) (HCC) 08/20/2023   Encephalopathy 08/20/2023   Type 2 diabetes mellitus (HCC) 08/20/2023   Hypertension 08/20/2023   History of breast cancer 08/20/2023   CKD (chronic kidney disease), stage III (HCC) 08/20/2023   Lumbar foraminal stenosis 08/20/2023   PCP:  [facility] Pharmacy:  No Pharmacies Listed    Social Drivers of Health (SDOH) Social History: SDOH Screenings   Food Insecurity: No Food Insecurity (08/20/2023)  Housing: Low Risk  (08/20/2023)  Transportation Needs: No Transportation Needs (08/20/2023)  Utilities: Not At Risk (08/20/2023)  Social Connections: Socially Isolated (08/20/2023)  Tobacco Use: Low Risk  (08/20/2023)   SDOH Interventions:     Readmission Risk Interventions     No data to display

## 2023-08-21 NOTE — Progress Notes (Addendum)
 Progress Note   Patient: Melissa Mcdonald ZOX:[id number] DOB: [birth year] DOA: 08/20/2023     1 DOS: the patient was seen and examined on 08/21/2023   Brief hospital course:  Melissa Mcdonald is a 83 y.o. 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  Limited history in setting of encephalopathy.  Per report, patient, from local nursing facility.  Per report, patient with shortness of breath.  Was placed on 7 L nasal cannula normally on 6 L.  No reports of falls, head trauma, fevers chills or loss conscious.  At present, patient denies any chest pain.  Noted baseline history of chronic HFpEF as well as chronic respiratory failure.  No reports of recent change in medication regimen.  No reports of high salt intake.  No reports of recent NSAID use.  Daughter does report recent ?MRSA infection status post course of antibiotics (?MRSA screen).  Daughter also reports prior episodes of confusion associated with infection in the past. Presented to the ER afebrile, hemodynamically stable.  Satting in mid 90s on 2 to 4 L at present.  White count 11.5, hemoglobin 11.7, platelets 256, creatinine 1.74.  BNP 47.  EKG sinus rhythm.  Chest x-ray with cardiomegaly.   Review of Systems: As mentioned in the history of present illness. All other systems reviewed and are negative. History reviewed. No pertinent past medical history.     History reviewed. No pertinent surgical history.     Social History:  reports that she has never smoked. She has never used smokeless tobacco. No history on file for alcohol use and drug use.     Assessment and Plan:  Encephalopathy Suspect metabolic rule out acute stroke And related to UTI Patient is only  oriented to person, not to place or time and at her baseline is usually awake, alert and oriented x 3 per daughter Noted to have significant pyuria on admission Urine culture yielded E. coli Continue IV Rocephin empirically until results of urine culture become available Obtain MRI of the brain since patient has word finding difficulty with    * Acute  respiratory failure with hypoxia (HCC) Acute on chronic HFpEF (2D echo November 2019 with EF of 60 to 65%) Pulmonary hypertension Decompensated respiratory status, initially required 2 L nasal cannula in the setting of acute on chronic HFpEF and pulmonary hypertension Now on room air Chest x-ray showed cardiomegaly with pulmonary vascular congestion BNP within normal limits Patient received a dose of Lasix 20 mg IV and developed hypotension requiring fluid resuscitation Hold further doses of diuretics for now Obtain 2D echo to assess LVEF CT scan of the chest without contrast does not show any evidence of acute cardiopulmonary disease. 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    Subjective: Sitting up in bed.  Awake and alert but  oriented only to person not to place or time.  Physical Exam: Vitals:   08/21/23 0001 08/21/23 0137 08/21/23 0408 08/21/23 0958  BP: (!) 80/42 (!) 97/56 (!) 100/52 126/62  Pulse: 69 66 68 83  Resp:  16 16 20   Temp: 98.6 F (37 C) 97.8 F (36.6 C) 97.7 F (36.5 C) 97.9 F (36.6 C)  TempSrc: Oral     SpO2: 96% 100% 93% 99%  Weight:      Height:        Data Reviewed: Creatinine 1.6, white count 11, hemoglobin 11, A1c 6.5 Labs reviewed  Family Communication: Plan of care discussed with patient's daughter at the bedside.  All questions and concerns have been addressed.  She verbalizes understanding and agrees with the plan.  Disposition: Status is: Inpatient Remains inpatient appropriate because: On IV antibiotics for UTI  Planned Discharge Destination: Skilled nursing facility    Time spent: 36 minutes  Author: Lucile Shutters, MD 08/21/2023 11:11 AM  For on call review [email or web address].

## 2023-08-21 NOTE — Progress Notes (Signed)
       CROSS COVER NOTE  NAME: Melissa Mcdonald MRN: [id number] DOB : 22-Mar-1941    Concern as stated by nurse / staff   Admitted with Acute respiratory failure with hypoxia Acute on chronic CHF and UTI. 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 BP 80/42 manual   endorses feeling lightheaded  SPO2 92-96% on RA  CK HR 69      Pertinent findings on chart review: Today's H&P reviewed.  Patient admitted with CHF exacerbation, on gentle diuresis in the setting of hypotension on arrival with systolic in the 90s.  Patient with mild lightheadedness, no shortness of breath or chest pain    08/21/2023   12:01 AM 08/20/2023    9:13 PM 08/20/2023    7:31 PM  Vitals with BMI  Weight   196 lbs 14 oz  BMI   38.45  Systolic 80 122   Diastolic 42 62   Pulse 69 70      Assessment and  Interventions   Assessment:  Hypotension in the setting of diuretic treatment for CHF exacerbation, for the most part asymptomatic  Plan: Will gently rehydrate with 250 bolus Empiric midodrine so the patient can tolerate treatment of CHF exacerbation Frequent BP checks with goal to keep MAP over 65    Improved with interventions:    08/21/2023    4:08 AM 08/21/2023    1:37 AM 08/21/2023   12:01 AM  Vitals with BMI  Systolic 100 97 80  Diastolic 52 56 42  Pulse 68 66 69

## 2023-08-21 NOTE — Progress Notes (Addendum)
 OT Cancellation Note  Patient Details Name: Melissa Mcdonald MRN: [id number] DOB: 1940-10-18   Cancelled Treatment:    Reason Eval/Treat Not Completed: Other (comment). Consult received, chart reviewed. Upon attempt, pt with MD and another pt preparing to transfer into the double room. Will re-attempt at later time as pt is available.   Addendum 10:03am: On 2nd attempt, pt preparing for transport for imaging. Will re-attempt as pt is available.  Arman Filter., MPH, MS, OTR/L ascom [phone] 08/21/23, 9:37 AM

## 2023-08-21 NOTE — Progress Notes (Signed)
 RT called to place pt on CPAP unit per instructions from pt daughter. RT placed pt on settings noted in the flowsheet with no issues to report. Pt did stated, "that she did not like that mask". Pt resting with ease moments later.. RN @ bedside. RT will cont to monitor.

## 2023-08-21 NOTE — Progress Notes (Signed)
*  PRELIMINARY RESULTS* Echocardiogram 2D Echocardiogram has been performed.  Carolyne Fiscal 08/21/2023, 2:40 PM

## 2023-08-22 DIAGNOSIS — J9601 Acute respiratory failure with hypoxia: Secondary | ICD-10-CM | POA: Diagnosis not present

## 2023-08-22 LAB — GLUCOSE, CAPILLARY
Glucose-Capillary: 140 mg/dL — ABNORMAL HIGH (ref 70–99)
Glucose-Capillary: 101 mg/dL — ABNORMAL HIGH (ref 70–99)
Glucose-Capillary: 109 mg/dL — ABNORMAL HIGH (ref 70–99)

## 2023-08-22 LAB — URINE CULTURE: Culture: NO GROWTH

## 2023-08-22 MED ORDER — ACETAMINOPHEN 325 MG PO TABS: 650.0000 mg | ORAL_TABLET | Freq: Four times a day (QID) | ORAL | Status: DC | PRN

## 2023-08-22 MED ORDER — METOPROLOL TARTRATE 50 MG PO TABS
50.0000 mg | ORAL_TABLET | Freq: Every day | ORAL | Status: DC
  Administered 2023-08-22 – 2023-08-24 (×3): 50 mg via ORAL
  Filled 2023-08-22 (×3): qty 1

## 2023-08-22 MED ORDER — NYSTATIN 100000 UNIT/GM EX CREA
TOPICAL_CREAM | Freq: Two times a day (BID) | CUTANEOUS | Status: DC
  Filled 2023-08-22: qty 30

## 2023-08-22 MED ORDER — DULOXETINE HCL 30 MG PO CPEP
60.0000 mg | ORAL_CAPSULE | Freq: Every day | ORAL | Status: DC
  Administered 2023-08-22 – 2023-08-24 (×3): 60 mg via ORAL
  Filled 2023-08-22 (×3): qty 2

## 2023-08-22 MED ORDER — VENLAFAXINE HCL ER 75 MG PO CP24: 150.0000 mg | ORAL_CAPSULE | Freq: Every day | ORAL | Status: DC

## 2023-08-22 MED ORDER — ALLOPURINOL 100 MG PO TABS
200.0000 mg | ORAL_TABLET | Freq: Every day | ORAL | Status: DC
  Administered 2023-08-22 – 2023-08-24 (×3): 200 mg via ORAL
  Filled 2023-08-22 (×2): qty 2

## 2023-08-22 MED ORDER — LETROZOLE 2.5 MG PO TABS
2.5000 mg | ORAL_TABLET | Freq: Every day | ORAL | Status: DC
  Administered 2023-08-22 – 2023-08-24 (×3): 2.5 mg via ORAL
  Filled 2023-08-22 (×3): qty 1

## 2023-08-22 MED ORDER — GERHARDT'S BUTT CREAM
TOPICAL_CREAM | Freq: Three times a day (TID) | CUTANEOUS | Status: DC
  Filled 2023-08-22: qty 60

## 2023-08-22 MED ORDER — UMECLIDINIUM BROMIDE 62.5 MCG/ACT IN AEPB
1.0000 | INHALATION_SPRAY | Freq: Every day | RESPIRATORY_TRACT | Status: DC
  Administered 2023-08-22: 1 via RESPIRATORY_TRACT
  Filled 2023-08-22: qty 7

## 2023-08-22 MED ORDER — SACUBITRIL-VALSARTAN 49-51 MG PO TABS
1.0000 | ORAL_TABLET | Freq: Two times a day (BID) | ORAL | Status: DC
  Administered 2023-08-22 – 2023-08-24 (×5): 1 via ORAL
  Filled 2023-08-22 (×6): qty 1

## 2023-08-22 MED ORDER — ALPRAZOLAM 0.25 MG PO TABS
0.2500 mg | ORAL_TABLET | Freq: Every evening | ORAL | Status: DC | PRN
  Administered 2023-08-22 – 2023-08-23 (×2): 0.25 mg via ORAL
  Filled 2023-08-22 (×2): qty 1

## 2023-08-22 MED ORDER — CHLORHEXIDINE GLUCONATE CLOTH 2 % EX PADS
6.0000 | MEDICATED_PAD | Freq: Every day | CUTANEOUS | Status: DC
  Administered 2023-08-22 – 2023-08-24 (×3): 6 via TOPICAL

## 2023-08-22 NOTE — Plan of Care (Signed)

## 2023-08-22 NOTE — Plan of Care (Signed)
 Patient is alert to self and place. Denies pain this shift. Foley catheter in place. Three large bowel movements this shift. Incontinent of stool. Redness and moisture to groin, buttocks, and under folds of skin. Medications ordered, and applied with each BM. Has remained free from injury this shift. Good fluid intake but only about 25% of meal consumption. Blood glucose levels have been controlled.  Problem: Clinical Measurements: Goal: Cardiovascular complication will be avoided Outcome: Progressing   Problem: Elimination: Goal: Will not experience complications related to urinary retention Outcome: Progressing   Problem: Pain Managment: Goal: General experience of comfort will improve and/or be controlled Outcome: Progressing   Problem: Safety: Goal: Ability to remain free from injury will improve Outcome: Progressing   Problem: Fluid Volume: Goal: Ability to maintain a balanced intake and output will improve Outcome: Progressing   Problem: Metabolic: Goal: Ability to maintain appropriate glucose levels will improve Outcome: Progressing   Problem: Nutritional: Goal: Maintenance of adequate nutrition will improve Outcome: Progressing

## 2023-08-22 NOTE — Evaluation (Signed)
 Occupational Therapy Evaluation Patient Details Name: Melissa Mcdonald MRN: [id number] DOB: 1940/10/16 Today's Date: 08/22/2023   History of Present Illness   Pt is an 83 y.o. female presenting to hospital 08/20/23 with c/o SOB.  Pt admitted with acute on chronic respiratory failure with hypoxia, acute on chronic HFpEF, pulmonary htn, encephalopathy.  PMH includes chronic respiratory failure (reportedly on 6L [place] at facility), pulmonary htn, diastolic CHF, CKD stage 3-4, follicular lymphoma, DM, gout, L AKA, h/o breast CA.     Clinical Impressions Patient received for OT evaluation. See flowsheet below for details of function. Generally, patient requiring supervision for bed mobility (rolling), unable to perform functional mobility at baseline, and setup for UB ADLs and dependent for LB ADLs. Patient with no further need for OT in acute care; discharge OT services.     If plan is discharge home, recommend the following:   Two people to help with walking and/or transfers;A lot of help with bathing/dressing/bathroom;Assistance with cooking/housework;Direct supervision/assist for medications management;Direct supervision/assist for financial management;Assist for transportation;Help with stairs or ramp for entrance     Functional Status Assessment   Patient has not had a recent decline in their functional status     Equipment Recommendations   None recommended by OT     Recommendations for Other Services         Precautions/Restrictions   Precautions Precautions: Fall Recall of Precautions/Restrictions: Impaired Restrictions Weight Bearing Restrictions Per Provider Order: No Other Position/Activity Restrictions: patient is bedbound/ dependent lift at baseline.     Mobility Bed Mobility Overal bed mobility: Needs Assistance Bed Mobility: Rolling Rolling: Supervision, Used rails         General bed mobility comments: Pt leaning to the L when OT arrived; pt able to  follow cues for repositioning to the R and able to lean forward (HOB raised) into circle sitting.    Transfers                   General transfer comment: deferred 2.2 pt hoyer lift t/f at baseline      Balance                                           ADL either performed or assessed with clinical judgement   ADL Overall ADL's : At baseline                                       General ADL Comments: Patient is bedbound at baseline; transfers with dependent lift 1x/week. Today pt is able to brush teeth, wash face, and brush hair with RUE with set up assist and HOB raised. Anticipate pt is at her functional baseline.     Vision Baseline Vision/History: 1 Wears glasses Additional Comments: Wears glasses, has them at bedside table and puts them on when OT offered.     Perception         Praxis         Pertinent Vitals/Pain Pain Assessment Pain Assessment: No/denies pain     Extremity/Trunk Assessment Upper Extremity Assessment Upper Extremity Assessment: Overall WFL for tasks assessed;Generalized weakness   Lower Extremity Assessment Lower Extremity Assessment: Defer to PT evaluation RLE Deficits / Details: R knee in flexed position. L AKA.       Communication Communication  Communication: Impaired Factors Affecting Communication: Difficulty expressing self   Cognition Arousal: Alert Behavior During Therapy: WFL for tasks assessed/performed Cognition: Cognition impaired, No family/caregiver present to determine baseline   Orientation impairments: Place, Time, Situation         OT - Cognition Comments: Pt is pleasant and cooperative. Noted word-finding/speech production impairments. Pt able to state her name and ask where her daughter Britta Mccreedy is; unable to provide any other orientation information. Able to follow simple commands ("scoot over a little bit", "brush your teeth") during session.                  Following commands: Intact Following commands impaired: Follows one step commands with increased time     Cueing  General Comments   Cueing Techniques: Verbal cues;Tactile cues;Visual cues  Pt on room air. Required assistance to use phone to call daughter; unable to follow instructions to hit numbers on phone when OT provided number (on board).   Exercises     Shoulder Instructions      Home Living Family/patient expects to be discharged to:: Assisted living                             Home Equipment: Wheelchair - manual (hoyer lift)   Additional Comments: From medical record, pt lives at a facility.      Prior Functioning/Environment Prior Level of Function : Needs assist             Mobility Comments: Per medical record, hoyer lift t/f at baseline; transfers 1x/week for hair appointment.  Typically can log roll. ADLs Comments: Per medical record, pt dependent for toileting at bed level. No other information available.    OT Problem List:     OT Treatment/Interventions:        OT Goals(Current goals can be found in the care plan section)       OT Frequency:       Co-evaluation              AM-PAC OT "6 Clicks" Daily Activity     Outcome Measure Help from another person eating meals?: None Help from another person taking care of personal grooming?: None Help from another person toileting, which includes using toliet, bedpan, or urinal?: Total Help from another person bathing (including washing, rinsing, drying)?: A Lot Help from another person to put on and taking off regular upper body clothing?: A Little Help from another person to put on and taking off regular lower body clothing?: Total 6 Click Score: 15   End of Session Nurse Communication: Mobility status  Activity Tolerance: Patient tolerated treatment well Patient left: in bed;with call bell/phone within reach;with bed alarm set                   Time: 4098-1191 OT Time  Calculation (min): 16 min Charges:  OT General Charges $OT Visit: 1 Visit OT Evaluation $OT Eval Moderate Complexity: 1 Mod  Jalicia Roszak Junie Panning, MS, OTR/L  Alvester Morin 08/22/2023, 9:22 AM

## 2023-08-22 NOTE — Progress Notes (Signed)
 PROGRESS NOTE    Melissa Mcdonald  ZOX:[id number] DOB: 1940/10/10 DOA: 08/20/2023 PCP: [facility]    Brief Narrative:  83 y.o. 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  Limited history in setting of encephalopathy.  Per report, patient, from local nursing facility.  Per report, patient with shortness of breath.  Was placed on 7 L nasal cannula normally on 6 L.  No reports of falls, head trauma, fevers chills or loss conscious.  At present, patient denies any chest pain.  Noted baseline history of chronic HFpEF as well as chronic respiratory failure.  No reports of recent change in medication regimen.  No reports of high salt intake.  No reports of recent NSAID use.  Daughter does report recent ?MRSA infection status post course of antibiotics (?MRSA screen).  Daughter also reports prior episodes of confusion associated with infection in the past. Presented to the ER afebrile, hemodynamically stable.  Satting in mid 90s on 2 to 4 L at present.  White count 11.5, hemoglobin 11.7, platelets 256, creatinine 1.74.  BNP 47.  EKG sinus rhythm.  Chest x-ray with cardiomegaly.   Assessment & Plan:   Principal Problem:   Acute respiratory failure with hypoxia (HCC) Active Problems:   Encephalopathy   COPD (chronic obstructive pulmonary disease) (HCC)   Type 2 diabetes mellitus (HCC)   Hypertension   History of breast cancer   CKD (chronic kidney disease), stage III (HCC)   Lumbar foraminal stenosis  Acute metabolic encephalopathy Suspect GI related Patient alert to person only.  Per daughter usually oriented x 3 Noted to have significant pyuria on admission MRI negative for CVA Plan: Continue IV  Rocephin Monitor vitals and fever curve Follow urine culture    Acute  respiratory failure with hypoxia (HCC) Acute on chronic HFpEF (2D echo November 2019 with EF of 60 to 65%) Pulmonary hypertension Decompensated respiratory status, initially required 2 L nasal cannula in the setting of acute on chronic HFpEF and pulmonary hypertension Now on room air Chest x-ray showed cardiomegaly with pulmonary vascular congestion BNP within normal limits Patient received a dose of Lasix 20 mg IV and developed hypotension requiring fluid resuscitation 2D echocardiogram reassuring.  Normal EF with grade 1 diastolic dysfunction Plan: Continue home torsemide     Lumbar foraminal stenosis Noted baseline history of chronic back pain Minimize sedating medications where possible in the setting of encephalopathy Monitor     CKD (chronic kidney disease), stage IIIb (HCC) Creatinine 1.7 with GFR in the upper 20s to 30s Creatinine at or near baseline     History of breast cancer Continue letrozole     Type 2 diabetes mellitus (HCC) with complications of stage IIIb chronic kidney disease Maintain consistent carbohydrate diet Sliding scale insulin for glycemic control     COPD (chronic obstructive pulmonary disease) (HCC) Baseline COPD Does not appear to be acutely  exacerbated Continue as needed bronchodilator therapy     Class III obesity BMI 38.45 Complicates overall prognosis and care.      DVT prophylaxis: Lovenox Code Status: DNR Family Communication:Daughter Britta Mccreedy [phone] on 3/19 Disposition Plan: Status is: Inpatient Remains inpatient appropriate because: UTI with associated metabolic encephalopathy   Level of care: Telemetry Medical  Consultants:  None  Procedures:  None  Antimicrobials: Ceftriaxone  Subjective: Seen and examined.  No pain complaints.  Oriented to person only  Objective: Vitals:   08/21/23 1900 08/22/23 0028 08/22/23 0451 08/22/23  0755  BP: 122/77 (!) 141/66 (!) 125/51 (!) 143/81  Pulse: 87 78 93 97  Resp: 18 18 19 17   Temp: 98.3 F (36.8 C) 98 F (36.7 C) 98.2 F (36.8 C) (!) 97.5 F (36.4 C)  TempSrc: Axillary     SpO2: 97% 94% 96% 98%  Weight:      Height:        Intake/Output Summary (Last 24 hours) at 08/22/2023 1331 Last data filed at 08/22/2023 0500 Gross per 24 hour  Intake 240 ml  Output 1750 ml  Net -1510 ml   Filed Weights   08/20/23 1931  Weight: 89.3 kg    Examination:  General exam: Appears calm and comfortable  Respiratory system: Clear to auscultation. Respiratory effort normal. Cardiovascular system: S1-S2, RRR, no murmurs, no pedal edema Gastrointestinal system: Obese, soft, NT/ND, normal bowel sounds Central nervous system: Alert.  Oriented to person only.  No focal deficits Extremities: Left AKA Skin: No rashes, lesions or ulcers Psychiatry: Judgement and insight appear impaired. Mood & affect confused.     Data Reviewed: I have personally reviewed following labs and imaging studies  CBC: Recent Labs  Lab 08/20/23 1308 08/21/23 0436  WBC 11.5* 11.0*  NEUTROABS 8.8*  --   HGB 11.7* 11.1*  HCT 37.4 34.1*  MCV 105.6* 101.8*  PLT 256 246   Basic Metabolic Panel: Recent Labs  Lab 08/20/23 1308 08/21/23 0436  NA 141 138  K 4.4 3.6  CL 101 102  CO2 31 27  GLUCOSE 119* 101*  BUN 29* 28*  CREATININE 1.74* 1.61*  CALCIUM 8.6* 8.6*   GFR: Estimated Creatinine Clearance: 26.8 mL/min (A) (by C-G formula based on SCr of 1.61 mg/dL (H)). Liver Function Tests: Recent Labs  Lab 08/20/23 1710 08/21/23 0436  AST 15 15  ALT 15 15  ALKPHOS 66 56  BILITOT 0.5 0.8  PROT 7.0 6.4*  ALBUMIN 3.3* 3.1*   No results for input(s): "LIPASE", "AMYLASE" in the last 168 hours. Recent Labs  Lab 08/20/23 1713  AMMONIA 14   Coagulation Profile: No results for input(s): "INR", "PROTIME" in the last 168 hours. Cardiac Enzymes: No results for input(s): "CKTOTAL", "CKMB",  "CKMBINDEX", "TROPONINI" in the last 168 hours. BNP (last 3 results) No results for input(s): "PROBNP" in the last 8760 hours. HbA1C: Recent Labs    08/20/23 1710  HGBA1C 6.5*   CBG: Recent Labs  Lab 08/21/23 1751 08/21/23 2104 08/21/23 2156 08/22/23 0756 08/22/23 1136  GLUCAP 113* 103* 108* 140* 101*   Lipid Profile: No results for input(s): "CHOL", "HDL", "LDLCALC", "TRIG", "CHOLHDL", "LDLDIRECT" in the last 72 hours. Thyroid Function Tests: No results for input(s): "TSH", "T4TOTAL", "FREET4", "T3FREE", "THYROIDAB" in the last 72 hours. Anemia Panel: No results for input(s): "VITAMINB12", "FOLATE", "FERRITIN", "TIBC", "IRON", "RETICCTPCT" in the last 72 hours. Sepsis Labs: No results for input(s): "PROCALCITON", "LATICACIDVEN" in the last 168 hours.  No results found for this or any previous visit (from the past 240 hours).       Radiology Studies: MR BRAIN WO CONTRAST Result Date: 08/21/2023 CLINICAL DATA:  Initial evaluation for suspected stroke, confusion. EXAM: MRI HEAD WITHOUT CONTRAST TECHNIQUE: Multiplanar, multiecho pulse sequences of the brain and surrounding structures were obtained without intravenous contrast. COMPARISON:  CT from 08/20/2023 FINDINGS: Brain: Examination degraded by motion artifact. Cerebral volume within normal limits.  Patchy T2/FLAIR hyperintensity involving the periventricular white matter, most characteristic of chronic microvascular ischemic disease, mild for age. No evidence for acute or subacute infarct. No areas of chronic cortical infarction. No acute or chronic intracranial blood products. No mass lesion, midline shift or mass effect. No hydrocephalus or extra-axial fluid collection. Pituitary gland mildly prominent with convex border superiorly but no discrete lesion. Vascular: Major intracranial vascular flow voids are grossly maintained. Skull and upper cervical spine: Craniocervical junction within normal limits. Bone marrow signal  intensity normal. No scalp soft tissue abnormality. Sinuses/Orbits: Prior bilateral ocular lens replacement. Paranasal sinuses are largely clear. No significant mastoid effusion. Other: Left parotid gland is hypoplastic and/or absent. IMPRESSION: 1. No acute intracranial abnormality. 2. Mild chronic microvascular ischemic disease for age. Electronically Signed   By: Rise Mu M.D.   On: 08/21/2023 19:57   ECHOCARDIOGRAM COMPLETE Result Date: 08/21/2023    ECHOCARDIOGRAM REPORT   Patient Name:   Melissa Mcdonald Date of Exam: 08/21/2023 Medical Rec #:  [id number]       Height:       60.0 in Accession #:    [id number]      Weight:       196.9 lb Date of Birth:  1940/12/26        BSA:          1.854 m Patient Age:    83 years        BP:           134/68 mmHg Patient Gender: F               HR:           93 bpm. Exam Location:  [facility] Procedure: 2D Echo, Cardiac Doppler and Color Doppler (Both Spectral and Color            Flow Doppler were utilized during procedure). Indications:     CHF  History:         Patient has no prior history of Echocardiogram examinations.                  CHF, COPD; Risk Factors:Hypertension and Diabetes. CKD.  Sonographer:     Mikki Harbor Referring Phys:  [id number] Francoise Schaumann [place] Diagnosing Phys: Yvonne Kendall MD  Sonographer Comments: Technically difficult study due to poor echo windows. Image acquisition challenging due to COPD. IMPRESSIONS  1. Left ventricular ejection fraction, by estimation, is 60 to 65%. The left ventricle has normal function. The left ventricle has no regional wall motion abnormalities. Left ventricular diastolic parameters are consistent with Grade I diastolic dysfunction (impaired relaxation).  2. Right ventricular systolic function is normal. The right ventricular size is normal. Mildly increased right ventricular wall thickness.  3. The mitral valve is normal in structure. No evidence of mitral valve regurgitation. No evidence of mitral stenosis.  4.  The aortic valve is tricuspid. There is mild calcification of the aortic valve. There is mild thickening of the aortic valve. Aortic valve regurgitation is not visualized. Aortic valve sclerosis is present, with no evidence of aortic valve stenosis. FINDINGS  Left Ventricle: Left ventricular ejection fraction, by estimation, is 60 to 65%. The left ventricle has normal function. The left ventricle has no regional wall motion abnormalities. The left ventricular internal cavity size was normal in size. There is  borderline left ventricular hypertrophy. Left ventricular diastolic parameters are consistent with Grade I diastolic dysfunction (impaired relaxation). Right Ventricle: The right ventricular size is normal. Mildly increased  right ventricular wall thickness. Right ventricular systolic function is normal. Left Atrium: Left atrial size was normal in size. Right Atrium: Right atrial size was normal in size. Pericardium: Trivial pericardial effusion is present. Mitral Valve: The mitral valve is normal in structure. No evidence of mitral valve regurgitation. No evidence of mitral valve stenosis. MV peak gradient, 3.4 mmHg. The mean mitral valve gradient is 2.0 mmHg. Tricuspid Valve: The tricuspid valve is grossly normal. Tricuspid valve regurgitation is trivial. No evidence of tricuspid stenosis. Aortic Valve: The aortic valve is tricuspid. There is mild calcification of the aortic valve. There is mild thickening of the aortic valve. Aortic valve regurgitation is not visualized. Aortic valve sclerosis is present, with no evidence of aortic valve stenosis. Aortic valve mean gradient measures 6.0 mmHg. Aortic valve peak gradient measures 12.1 mmHg. Aortic valve area, by VTI measures 2.23 cm. Pulmonic Valve: The pulmonic valve was not well visualized. Pulmonic valve regurgitation is trivial. Aorta: The aortic root is normal in size and structure. Venous: The inferior vena cava was not well visualized. IAS/Shunts: The  interatrial septum appears to be lipomatous. No atrial level shunt detected by color flow Doppler.  LEFT VENTRICLE PLAX 2D LVIDd:         4.20 cm     Diastology LVIDs:         2.10 cm     LV e' medial:    6.96 cm/s LV PW:         1.08 cm     LV E/e' medial:  7.9 LV IVS:        0.98 cm     LV e' lateral:   10.10 cm/s LVOT diam:     2.00 cm     LV E/e' lateral: 5.4 LV SV:         69 LV SV Index:   37 LVOT Area:     3.14 cm  LV Volumes (MOD) LV vol d, MOD A2C: 37.2 ml LV vol d, MOD A4C: 39.4 ml LV vol s, MOD A2C: 17.5 ml LV vol s, MOD A4C: 16.6 ml LV SV MOD A2C:     19.7 ml LV SV MOD A4C:     39.4 ml LV SV MOD BP:      20.8 ml RIGHT VENTRICLE RV Basal diam:  3.10 cm RV Mid diam:    2.90 cm RV S prime:     15.40 cm/s LEFT ATRIUM             Index        RIGHT ATRIUM           Index LA diam:        3.90 cm 2.10 cm/m   RA Area:     15.50 cm LA Vol (A2C):   39.1 ml 21.09 ml/m  RA Volume:   40.40 ml  21.79 ml/m LA Vol (A4C):   49.8 ml 26.86 ml/m LA Biplane Vol: 44.4 ml 23.94 ml/m  AORTIC VALVE                     PULMONIC VALVE AV Area (Vmax):    2.37 cm      PV Vmax:       1.24 m/s AV Area (Vmean):   2.25 cm      PV Peak grad:  6.2 mmHg AV Area (VTI):     2.23 cm AV Vmax:           174.00 cm/s AV Vmean:  116.000 cm/s AV VTI:            0.309 m AV Peak Grad:      12.1 mmHg AV Mean Grad:      6.0 mmHg LVOT Vmax:         131.00 cm/s LVOT Vmean:        83.200 cm/s LVOT VTI:          0.219 m LVOT/AV VTI ratio: 0.71  AORTA Ao Root diam: 3.10 cm MITRAL VALVE               TRICUSPID VALVE MV Area (PHT): 4.44 cm    TR Peak grad:   28.1 mmHg MV Area VTI:   2.63 cm    TR Vmax:        265.00 cm/s MV Peak grad:  3.4 mmHg MV Mean grad:  2.0 mmHg    SHUNTS MV Vmax:       0.93 m/s    Systemic VTI:  0.22 m MV Vmean:      60.1 cm/s   Systemic Diam: 2.00 cm MV Decel Time: 171 msec MV E velocity: 54.80 cm/s MV A velocity: 87.10 cm/s MV E/A ratio:  0.63 Cristal De[place] End MD Electronically signed by Yvonne Kendall MD  Signature Date/Time: 08/21/2023/2:52:49 PM    Final    CT CHEST WO CONTRAST Result Date: 08/21/2023 CLINICAL DATA:  Pleural effusion. EXAM: CT CHEST WITHOUT CONTRAST TECHNIQUE: Multidetector CT imaging of the chest was performed following the standard protocol without IV contrast. RADIATION DOSE REDUCTION: This exam was performed according to the departmental dose-optimization program which includes automated exposure control, adjustment of the mA and/or kV according to patient size and/or use of iterative reconstruction technique. COMPARISON:  Chest radiograph dated 08/20/2023. FINDINGS: Evaluation of this exam is limited in the absence of intravenous contrast. Cardiovascular: There is no cardiomegaly or pericardial effusion. Two vessel coronary vascular calcification. Mild atherosclerotic calcification of the thoracic aorta. No aneurysmal dilatation. The central pulmonary arteries are grossly unremarkable. Mediastinum/Nodes: No hilar or mediastinal adenopathy. The esophagus is grossly unremarkable. There is a 3 cm left thyroid nodule. This has been evaluated on previous imaging. (ref: J Am Coll Radiol. 2015 Feb;12(2): 143-50).No mediastinal fluid collection. Lungs/Pleura: Left apical subpleural scarring. No focal consolidation, pleural effusion, or pneumothorax. The central airways are patent. Upper Abdomen: Multiple gallstones. Musculoskeletal: Osteopenia with degenerative changes of the spine. No acute osseous pathology. IMPRESSION: 1. No acute intrathoracic pathology. No pleural effusion. 2. Cholelithiasis. Electronically Signed   By: Elgie Collard M.D.   On: 08/21/2023 11:23   CT HEAD WO CONTRAST ( ) Result Date: 08/20/2023 CLINICAL DATA:  Initial evaluation for mental status change, unknown cause. EXAM: CT HEAD WITHOUT CONTRAST TECHNIQUE: Contiguous axial images were obtained from the base of the skull through the vertex without intravenous contrast. RADIATION DOSE REDUCTION: This exam was performed  according to the departmental dose-optimization program which includes automated exposure control, adjustment of the mA and/or kV according to patient size and/or use of iterative reconstruction technique. COMPARISON:  Prior study from 06/12/2017 FINDINGS: Brain: Generalized age-related cerebral atrophy with mild chronic microvascular ischemic disease. No acute intracranial hemorrhage. No acute large vessel territory infarct. No mass lesion, midline shift or mass effect. No hydrocephalus or extra-axial fluid collection. Vascular: No abnormal hyperdense vessel. Scattered vascular calcifications noted within the carotid siphons. Skull: Scalp soft tissues demonstrate no acute finding. Calvarium intact. Sinuses/Orbits: Globes orbital soft tissues within normal limits. Paranasal sinuses are largely clear. No mastoid effusion. Other:  None. IMPRESSION: 1. No acute intracranial abnormality. 2. Generalized age-related cerebral atrophy with mild chronic small vessel ischemic disease. Electronically Signed   By: Rise Mu M.D.   On: 08/20/2023 22:02        Scheduled Meds:  allopurinol  200 mg Oral Daily   busPIRone  5 mg Oral TID   Chlorhexidine Gluconate Cloth  6 each Topical Daily   DULoxetine  60 mg Oral Daily   enoxaparin (LOVENOX) injection  30 mg Subcutaneous Q24H   insulin aspart  0-9 Units Subcutaneous TID WC   insulin glargine  6 Units Subcutaneous QHS   letrozole  2.5 mg Oral Daily   metoprolol tartrate  50 mg Oral Daily   midodrine  5 mg Oral TID WC   mometasone-formoterol  2 puff Inhalation BID   potassium chloride  20 mEq Oral Daily   sacubitril-valsartan  1 tablet Oral BID   simvastatin  10 mg Oral QHS   sodium chloride flush  3 mL Intravenous Q12H   torsemide  20 mg Oral Daily   umeclidinium bromide  1 puff Inhalation Daily   Continuous Infusions:  cefTRIAXone (ROCEPHIN)  IV 2 g (08/21/23 2229)     LOS: 2 days     Tresa Moore, MD [facility]   If  7PM-7AM, please contact night-coverage  08/22/2023, 1:31 PM

## 2023-08-23 DIAGNOSIS — J9601 Acute respiratory failure with hypoxia: Secondary | ICD-10-CM | POA: Diagnosis not present

## 2023-08-23 LAB — GLUCOSE, CAPILLARY
Glucose-Capillary: 180 mg/dL — ABNORMAL HIGH (ref 70–99)
Glucose-Capillary: 164 mg/dL — ABNORMAL HIGH (ref 70–99)
Glucose-Capillary: 93 mg/dL (ref 70–99)
Glucose-Capillary: 135 mg/dL — ABNORMAL HIGH (ref 70–99)

## 2023-08-23 LAB — CBC WITH DIFFERENTIAL/PLATELET
Abs Immature Granulocytes: 0.02 10*3/uL (ref 0.00–0.07)
Basophils Absolute: 0.1 10*3/uL (ref 0.0–0.1)
Basophils Relative: 1 %
Eosinophils Absolute: 0.4 10*3/uL (ref 0.0–0.5)
Eosinophils Relative: 4 %
HCT: 37.8 % (ref 36.0–46.0)
Hemoglobin: 12.6 g/dL (ref 12.0–15.0)
Immature Granulocytes: 0 %
Lymphocytes Relative: 15 %
Lymphs Abs: 1.5 10*3/uL (ref 0.7–4.0)
MCH: 32.1 pg (ref 26.0–34.0)
MCHC: 33.3 g/dL (ref 30.0–36.0)
MCV: 96.4 fL (ref 80.0–100.0)
Monocytes Absolute: 0.5 10*3/uL (ref 0.1–1.0)
Monocytes Relative: 5 %
Neutro Abs: 7.7 10*3/uL (ref 1.7–7.7)
Neutrophils Relative %: 75 %
Platelets: 245 10*3/uL (ref 150–400)
RBC: 3.92 MIL/uL (ref 3.87–5.11)
RDW: 14.7 % (ref 11.5–15.5)
WBC: 10.2 10*3/uL (ref 4.0–10.5)
nRBC: 0 % (ref 0.0–0.2)

## 2023-08-23 LAB — BASIC METABOLIC PANEL
Anion gap: 10 (ref 5–15)
BUN: 20 mg/dL (ref 8–23)
CO2: 22 mmol/L (ref 22–32)
Calcium: 8.8 mg/dL — ABNORMAL LOW (ref 8.9–10.3)
Chloride: 105 mmol/L (ref 98–111)
Creatinine, Ser: 1.2 mg/dL — ABNORMAL HIGH (ref 0.44–1.00)
GFR, Estimated: 45 mL/min — ABNORMAL LOW (ref >60)
Glucose, Bld: 188 mg/dL — ABNORMAL HIGH (ref 70–99)
Potassium: 4 mmol/L (ref 3.5–5.1)
Sodium: 137 mmol/L (ref 135–145)

## 2023-08-23 MED ORDER — ENOXAPARIN SODIUM 40 MG/0.4ML IJ SOSY
40.0000 mg | PREFILLED_SYRINGE | INTRAMUSCULAR | Status: DC
  Administered 2023-08-23: 40 mg via SUBCUTANEOUS
  Filled 2023-08-23: qty 0.4

## 2023-08-23 NOTE — Progress Notes (Signed)
 PROGRESS NOTE    Melissa Mcdonald  ZHY:[id number] DOB: 1941/02/18 DOA: 08/20/2023 PCP: [facility]    Brief Narrative:  83 y.o. 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  Limited history in setting of encephalopathy.  Per report, patient, from local nursing facility.  Per report, patient with shortness of breath.  Was placed on 7 L nasal cannula normally on 6 L.  No reports of falls, head trauma, fevers chills or loss conscious.  At present, patient denies any chest pain.  Noted baseline history of chronic HFpEF as well as chronic respiratory failure.  No reports of recent change in medication regimen.  No reports of high salt intake.  No reports of recent NSAID use.  Daughter does report recent ?MRSA infection status post course of antibiotics (?MRSA screen).  Daughter also reports prior episodes of confusion associated with infection in the past. Presented to the ER afebrile, hemodynamically stable.  Satting in mid 90s on 2 to 4 L at present.  White count 11.5, hemoglobin 11.7, platelets 256, creatinine 1.74.  BNP 47.  EKG sinus rhythm.  Chest x-ray with cardiomegaly.   Assessment & Plan:   Principal Problem:   Acute respiratory failure with hypoxia (HCC) Active Problems:   Encephalopathy   COPD (chronic obstructive pulmonary disease) (HCC)   Type 2 diabetes mellitus (HCC)   Hypertension   History of breast cancer   CKD (chronic kidney disease), stage III (HCC)   Lumbar foraminal stenosis  Acute metabolic encephalopathy Orientation slowly improving Noted to have significant pyuria on admission MRI negative for CVA Interestingly urine culture with no growth.  Likely affected by previous exposure  to antibiotic Plan: Continue IV Rocephin x 5 days Monitor vitals and fever curve Frequent reorienting measures Avoid nonessential sedatives    Acute  respiratory failure with hypoxia (HCC) Acute on chronic HFpEF (2D echo November 2019 with EF of 60 to 65%) Pulmonary hypertension Decompensated respiratory status, initially required 2 L nasal cannula in the setting of acute on chronic HFpEF and pulmonary hypertension Now on room air Chest x-ray showed cardiomegaly with pulmonary vascular congestion BNP within normal limits Patient received a dose of Lasix 20 mg IV and developed hypotension requiring fluid resuscitation 2D echocardiogram reassuring.  Normal EF with grade 1 diastolic dysfunction Plan: Continue home torsemide     Lumbar foraminal stenosis Noted baseline history of chronic back pain Minimize sedating medications where possible in the setting of encephalopathy Monitor     CKD (chronic kidney disease), stage IIIb (HCC) Creatinine 1.7 with GFR in the upper 20s to 30s Creatinine at or near baseline Improving daily No IV fluids.  Encourage p.o. 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      DVT prophylaxis: Lovenox Code Status: DNR Family Communication:Daughter Britta Mccreedy [phone] on 3/19, at bedside 3/20 Disposition Plan: Status is: Inpatient Remains inpatient appropriate because: UTI with  associated metabolic encephalopathy   Level of care: Telemetry Medical  Consultants:  None  Procedures:   None  Antimicrobials: Ceftriaxone   Subjective: Seen and examined.  Reports poor appetite otherwise stable.  Orientation improved today.  Objective: Vitals:   08/22/23 1642 08/22/23 1956 08/23/23 0605 08/23/23 1100  BP: (!) 102/50 (!) 145/72 128/62 124/64  Pulse: 72 79 80 (!) 110  Resp: 20 18  18   Temp: 98 F (36.7 C) 98 F (36.7 C) 97.9 F (36.6 C) (!) 97.5 F (36.4 C)  TempSrc:    Oral  SpO2: 96% 97% 99% 94%  Weight:      Height:        Intake/Output Summary (Last 24 hours) at 08/23/2023 1258 Last data filed at 08/22/2023 2201 Gross per 24 hour  Intake 300 ml  Output 700 ml  Net -400 ml   Filed Weights   08/20/23 1931  Weight: 89.3 kg    Examination:  General exam: Appears fatigued Respiratory system: Clear to auscultation. Respiratory effort normal. Cardiovascular system: S1-S2, RRR, no murmurs, no pedal edema Gastrointestinal system: Obese, soft, NT/ND, normal bowel sounds Central nervous system: Alert.  Oriented to person and place.  No focal deficits Extremities: Status post left AKA Skin: No rashes, lesions or ulcers Psychiatry: Judgement and insight appear impaired. Mood & affect confused.     Data Reviewed: I have personally reviewed following labs and imaging studies  CBC: Recent Labs  Lab 08/20/23 1308 08/21/23 0436 08/23/23 1104  WBC 11.5* 11.0* 10.2  NEUTROABS 8.8*  --  7.7  HGB 11.7* 11.1* 12.6  HCT 37.4 34.1* 37.8  MCV 105.6* 101.8* 96.4  PLT 256 246 245   Basic Metabolic Panel: Recent Labs  Lab 08/20/23 1308 08/21/23 0436 08/23/23 1104  NA 141 138 137  K 4.4 3.6 4.0  CL 101 102 105  CO2 31 27 22   GLUCOSE 119* 101* 188*  BUN 29* 28* 20  CREATININE 1.74* 1.61* 1.20*  CALCIUM 8.6* 8.6* 8.8*   GFR: Estimated Creatinine Clearance: 35.9 mL/min (A) (by C-G formula based on SCr of 1.2 mg/dL (H)). Liver Function Tests: Recent Labs  Lab 08/20/23 1710 08/21/23 0436  AST 15 15  ALT 15 15  ALKPHOS 66 56  BILITOT 0.5 0.8   PROT 7.0 6.4*  ALBUMIN 3.3* 3.1*   No results for input(s): "LIPASE", "AMYLASE" in the last 168 hours. Recent Labs  Lab 08/20/23 1713  AMMONIA 14   Coagulation Profile: No results for input(s): "INR", "PROTIME" in the last 168 hours. Cardiac Enzymes: No results for input(s): "CKTOTAL", "CKMB", "CKMBINDEX", "TROPONINI" in the last 168 hours. BNP (last 3 results) No results for input(s): "PROBNP" in the last 8760 hours. HbA1C: Recent Labs    08/20/23 1710  HGBA1C 6.5*   CBG: Recent Labs  Lab 08/22/23 0756 08/22/23 1136 08/22/23 2152 08/23/23 1108 08/23/23 1156  GLUCAP 140* 101* 109* 180* 164*   Lipid Profile: No results for input(s): "CHOL", "HDL", "LDLCALC", "TRIG", "CHOLHDL", "LDLDIRECT" in the last 72 hours. Thyroid Function Tests: No results for input(s): "TSH", "T4TOTAL", "FREET4", "T3FREE", "THYROIDAB" in the last 72 hours. Anemia Panel: No results for input(s): "VITAMINB12", "FOLATE", "FERRITIN", "TIBC", "IRON", "RETICCTPCT" in the last 72 hours. Sepsis Labs: No results for input(s): "PROCALCITON", "LATICACIDVEN" in the last 168 hours.  Recent Results (from the past 240 hours)  Urine Culture (for pregnant, neutropenic or urologic patients or patients with an indwelling urinary catheter)     Status: None   Collection Time: 08/21/23  1:09 PM   Specimen: Urine, Clean Catch  Result Value Ref Range Status   Specimen Description   Final    URINE, CLEAN CATCH Performed at [facility], [address], [place], [place] [address]    Special Requests   Final    NONE Performed at [facility], [address] [place], [place] [address]    Culture   Final    NO GROWTH Performed at [facility], [address] [place], [place] [address]    Report Status 08/22/2023 FINAL  Final         Radiology Studies: MR BRAIN WO CONTRAST Result Date: 08/21/2023 CLINICAL DATA:  Initial evaluation for suspected stroke, confusion. EXAM: MRI HEAD  WITHOUT CONTRAST TECHNIQUE: Multiplanar, multiecho pulse sequences of the brain and surrounding structures were obtained without intravenous contrast. COMPARISON:  CT from 08/20/2023 FINDINGS: Brain: Examination degraded by motion artifact. Cerebral volume within normal limits. Patchy T2/FLAIR hyperintensity involving the periventricular white matter, most characteristic of chronic microvascular ischemic disease, mild for age. No evidence for acute or subacute infarct. No areas of chronic cortical infarction. No acute or chronic intracranial blood products. No mass lesion, midline shift or mass effect. No hydrocephalus or extra-axial fluid collection. Pituitary gland mildly prominent with convex border superiorly but no discrete lesion. Vascular: Major intracranial vascular flow voids are grossly maintained. Skull and upper cervical spine: Craniocervical junction within normal limits. Bone marrow signal intensity normal. No scalp soft tissue abnormality. Sinuses/Orbits: Prior bilateral ocular lens replacement. Paranasal sinuses are largely clear. No significant mastoid effusion. Other: Left parotid gland is hypoplastic and/or absent. IMPRESSION: 1. No acute intracranial abnormality. 2. Mild chronic microvascular ischemic disease for age. Electronically Signed   By: Rise Mu M.D.   On: 08/21/2023 19:57   ECHOCARDIOGRAM COMPLETE Result Date: 08/21/2023    ECHOCARDIOGRAM REPORT   Patient Name:   Melissa Mcdonald Date of Exam: 08/21/2023 Medical Rec #:  [id number]       Height:       60.0 in Accession #:    [id number]      Weight:       196.9 lb Date of Birth:  1940/09/25        BSA:          1.854 m Patient Age:    83 years        BP:           134/68 mmHg Patient Gender: F               HR:           93 bpm. Exam Location:  [facility] Procedure: 2D Echo, Cardiac Doppler and Color Doppler (Both Spectral and Color            Flow Doppler were utilized during procedure). Indications:     CHF  History:          Patient has no prior history of Echocardiogram examinations.                  CHF, COPD; Risk Factors:Hypertension and Diabetes. CKD.  Sonographer:     Mikki Harbor Referring Phys:  [phone] Francoise Schaumann [place] Diagnosing Phys: Yvonne Kendall MD  Sonographer Comments: Technically difficult study due to poor echo windows. Image acquisition challenging due to COPD. IMPRESSIONS  1. Left ventricular ejection fraction, by estimation, is 60 to 65%. The left ventricle has normal function. The left ventricle has no regional wall motion abnormalities. Left ventricular  diastolic parameters are consistent with Grade I diastolic dysfunction (impaired relaxation).  2. Right ventricular systolic function is normal. The right ventricular size is normal. Mildly increased right ventricular wall thickness.  3. The mitral valve is normal in structure. No evidence of mitral valve regurgitation. No evidence of mitral stenosis.  4. The aortic valve is tricuspid. There is mild calcification of the aortic valve. There is mild thickening of the aortic valve. Aortic valve regurgitation is not visualized. Aortic valve sclerosis is present, with no evidence of aortic valve stenosis. FINDINGS  Left Ventricle: Left ventricular ejection fraction, by estimation, is 60 to 65%. The left ventricle has normal function. The left ventricle has no regional wall motion abnormalities. The left ventricular internal cavity size was normal in size. There is  borderline left ventricular hypertrophy. Left ventricular diastolic parameters are consistent with Grade I diastolic dysfunction (impaired relaxation). Right Ventricle: The right ventricular size is normal. Mildly increased right ventricular wall thickness. Right ventricular systolic function is normal. Left Atrium: Left atrial size was normal in size. Right Atrium: Right atrial size was normal in size. Pericardium: Trivial pericardial effusion is present. Mitral Valve: The mitral valve is normal in  structure. No evidence of mitral valve regurgitation. No evidence of mitral valve stenosis. MV peak gradient, 3.4 mmHg. The mean mitral valve gradient is 2.0 mmHg. Tricuspid Valve: The tricuspid valve is grossly normal. Tricuspid valve regurgitation is trivial. No evidence of tricuspid stenosis. Aortic Valve: The aortic valve is tricuspid. There is mild calcification of the aortic valve. There is mild thickening of the aortic valve. Aortic valve regurgitation is not visualized. Aortic valve sclerosis is present, with no evidence of aortic valve stenosis. Aortic valve mean gradient measures 6.0 mmHg. Aortic valve peak gradient measures 12.1 mmHg. Aortic valve area, by VTI measures 2.23 cm. Pulmonic Valve: The pulmonic valve was not well visualized. Pulmonic valve regurgitation is trivial. Aorta: The aortic root is normal in size and structure. Venous: The inferior vena cava was not well visualized. IAS/Shunts: The interatrial septum appears to be lipomatous. No atrial level shunt detected by color flow Doppler.  LEFT VENTRICLE PLAX 2D LVIDd:         4.20 cm     Diastology LVIDs:         2.10 cm     LV e' medial:    6.96 cm/s LV PW:         1.08 cm     LV E/e' medial:  7.9 LV IVS:        0.98 cm     LV e' lateral:   10.10 cm/s LVOT diam:     2.00 cm     LV E/e' lateral: 5.4 LV SV:         69 LV SV Index:   37 LVOT Area:     3.14 cm  LV Volumes (MOD) LV vol d, MOD A2C: 37.2 ml LV vol d, MOD A4C: 39.4 ml LV vol s, MOD A2C: 17.5 ml LV vol s, MOD A4C: 16.6 ml LV SV MOD A2C:     19.7 ml LV SV MOD A4C:     39.4 ml LV SV MOD BP:      20.8 ml RIGHT VENTRICLE RV Basal diam:  3.10 cm RV Mid diam:    2.90 cm RV S prime:     15.40 cm/s LEFT ATRIUM             Index        RIGHT  ATRIUM           Index LA diam:        3.90 cm 2.10 cm/m   RA Area:     15.50 cm LA Vol (A2C):   39.1 ml 21.09 ml/m  RA Volume:   40.40 ml  21.79 ml/m LA Vol (A4C):   49.8 ml 26.86 ml/m LA Biplane Vol: 44.4 ml 23.94 ml/m  AORTIC VALVE                      PULMONIC VALVE AV Area (Vmax):    2.37 cm      PV Vmax:       1.24 m/s AV Area (Vmean):   2.25 cm      PV Peak grad:  6.2 mmHg AV Area (VTI):     2.23 cm AV Vmax:           174.00 cm/s AV Vmean:          116.000 cm/s AV VTI:            0.309 m AV Peak Grad:      12.1 mmHg AV Mean Grad:      6.0 mmHg LVOT Vmax:         131.00 cm/s LVOT Vmean:        83.200 cm/s LVOT VTI:          0.219 m LVOT/AV VTI ratio: 0.71  AORTA Ao Root diam: 3.10 cm MITRAL VALVE               TRICUSPID VALVE MV Area (PHT): 4.44 cm    TR Peak grad:   28.1 mmHg MV Area VTI:   2.63 cm    TR Vmax:        265.00 cm/s MV Peak grad:  3.4 mmHg MV Mean grad:  2.0 mmHg    SHUNTS MV Vmax:       0.93 m/s    Systemic VTI:  0.22 m MV Vmean:      60.1 cm/s   Systemic Diam: 2.00 cm MV Decel Time: 171 msec MV E velocity: 54.80 cm/s MV A velocity: 87.10 cm/s MV E/A ratio:  0.63 Christopher End MD Electronically signed by Yvonne Kendall MD Signature Date/Time: 08/21/2023/2:52:49 PM    Final         Scheduled Meds:  allopurinol  200 mg Oral Daily   busPIRone  5 mg Oral TID   Chlorhexidine Gluconate Cloth  6 each Topical Daily   DULoxetine  60 mg Oral Daily   enoxaparin (LOVENOX) injection  30 mg Subcutaneous Q24H   Gerhardt's butt cream   Topical TID   insulin aspart  0-9 Units Subcutaneous TID WC   insulin glargine  6 Units Subcutaneous QHS   letrozole  2.5 mg Oral Daily   metoprolol tartrate  50 mg Oral Daily   mometasone-formoterol  2 puff Inhalation BID   nystatin cream   Topical BID   potassium chloride  20 mEq Oral Daily   sacubitril-valsartan  1 tablet Oral BID   simvastatin  10 mg Oral QHS   sodium chloride flush  3 mL Intravenous Q12H   torsemide  20 mg Oral Daily   umeclidinium bromide  1 puff Inhalation Daily   Continuous Infusions:  cefTRIAXone (ROCEPHIN)  IV 2 g (08/22/23 2107)     LOS: 3 days     Tresa Moore, MD [facility]   If 7PM-7AM, please contact night-coverage  08/23/2023,  12:58  PM

## 2023-08-23 NOTE — Care Management Important Message (Signed)
 Important Message  Patient Details  Name: Melissa Mcdonald MRN: [id number] Date of Birth: 1941-04-12   Important Message Given:  Yes - Medicare IM     Cristela Blue, CMA 08/23/2023, 12:02 PM

## 2023-08-24 DIAGNOSIS — J9601 Acute respiratory failure with hypoxia: Secondary | ICD-10-CM | POA: Diagnosis not present

## 2023-08-24 LAB — GLUCOSE, CAPILLARY
Glucose-Capillary: 128 mg/dL — ABNORMAL HIGH (ref 70–99)
Glucose-Capillary: 197 mg/dL — ABNORMAL HIGH (ref 70–99)
Glucose-Capillary: 71 mg/dL (ref 70–99)

## 2023-08-24 MED ORDER — ALPRAZOLAM 0.25 MG PO TABS: 0.2500 mg | ORAL_TABLET | Freq: Every evening | ORAL | 0 refills | Status: AC | PRN

## 2023-08-24 MED ORDER — NYSTATIN 100000 UNIT/GM EX CREA: TOPICAL_CREAM | Freq: Two times a day (BID) | CUTANEOUS | Status: AC

## 2023-08-24 MED ORDER — HYDROCODONE-ACETAMINOPHEN 7.5-325 MG PO TABS: 1.0000 | ORAL_TABLET | Freq: Four times a day (QID) | ORAL | 0 refills | Status: DC | PRN

## 2023-08-24 MED ORDER — GERHARDT'S BUTT CREAM: 1.0000 | TOPICAL_CREAM | Freq: Three times a day (TID) | CUTANEOUS | Status: DC

## 2023-08-24 NOTE — Plan of Care (Signed)
  Problem: Education: Goal: Knowledge of General Education information will improve Description: Including pain rating scale, medication(s)/side effects and non-pharmacologic comfort measures Outcome: Progressing   Problem: Health Behavior/Discharge Planning: Goal: Ability to manage health-related needs will improve Outcome: Progressing   Problem: Clinical Measurements: Goal: Ability to maintain clinical measurements within normal limits will improve Outcome: Progressing Goal: Will remain free from infection Outcome: Progressing Goal: Diagnostic test results will improve Outcome: Progressing Goal: Cardiovascular complication will be avoided Outcome: Progressing   Problem: Activity: Goal: Risk for activity intolerance will decrease Outcome: Progressing   Problem: Nutrition: Goal: Adequate nutrition will be maintained Outcome: Progressing   Problem: Coping: Goal: Level of anxiety will decrease Outcome: Progressing   Problem: Elimination: Goal: Will not experience complications related to bowel motility Outcome: Progressing Goal: Will not experience complications related to urinary retention Outcome: Progressing   Problem: Pain Managment: Goal: General experience of comfort will improve and/or be controlled Outcome: Progressing   Problem: Safety: Goal: Ability to remain free from injury will improve Outcome: Progressing   Problem: Skin Integrity: Goal: Risk for impaired skin integrity will decrease Outcome: Progressing   Problem: Education: Goal: Ability to demonstrate management of disease process will improve Outcome: Progressing Goal: Ability to verbalize understanding of medication therapies will improve Outcome: Progressing Goal: Individualized Educational Video(s) Outcome: Progressing   Problem: Activity: Goal: Capacity to carry out activities will improve Outcome: Progressing   Problem: Cardiac: Goal: Ability to achieve and maintain adequate  cardiopulmonary perfusion will improve Outcome: Progressing   Problem: Education: Goal: Ability to describe self-care measures that may prevent or decrease complications (Diabetes Survival Skills Education) will improve Outcome: Progressing Goal: Individualized Educational Video(s) Outcome: Progressing   Problem: Coping: Goal: Ability to adjust to condition or change in health will improve Outcome: Progressing   Problem: Fluid Volume: Goal: Ability to maintain a balanced intake and output will improve Outcome: Progressing   Problem: Health Behavior/Discharge Planning: Goal: Ability to identify and utilize available resources and services will improve Outcome: Progressing Goal: Ability to manage health-related needs will improve Outcome: Progressing   Problem: Metabolic: Goal: Ability to maintain appropriate glucose levels will improve Outcome: Progressing

## 2023-08-24 NOTE — Progress Notes (Signed)
 Physical Therapy Treatment Patient Details Name: Melissa Mcdonald MRN: [id number] DOB: 1940/08/10 Today's Date: 08/24/2023   History of Present Illness Pt is an 83 y.o. female presenting to hospital 08/20/23 with c/o SOB.  Pt admitted with acute on chronic respiratory failure with hypoxia, acute on chronic HFpEF, pulmonary htn, encephalopathy.  PMH includes chronic respiratory failure (reportedly on 6L  at facility), pulmonary htn, diastolic CHF, CKD stage 3-4, follicular lymphoma, DM, gout, L AKA, h/o breast CA.    PT Comments  Pt long sitting in bed upon PT arrival; pt agreeable to therapy.  During session pt modified independent with logrolling L/R in bed using bed rails (pt appearing stronger than on initial eval).  Pt appears to be at baseline level of functional mobility (uses hoyer lift at baseline and modified independent with logrolling in bed).  No acute PT needs identified; will sign off.    If plan is discharge home, recommend the following: Two people to help with walking and/or transfers;Assistance with cooking/housework;Assist for transportation;Help with stairs or ramp for entrance   Can travel by private vehicle        Equipment Recommendations  None recommended by PT;Other (comment) (Pt has needed DME at facility already)    Recommendations for Other Services       Precautions / Restrictions Precautions Precautions: Fall Recall of Precautions/Restrictions: Impaired Restrictions Weight Bearing Restrictions Per Provider Order: No Other Position/Activity Restrictions: L AKA     Mobility  Bed Mobility Overal bed mobility: Modified Independent Bed Mobility: Rolling Rolling: Modified independent (Device/Increase time)         General bed mobility comments: Modified independent logrolling L/R in bed using bed rails    Transfers                   General transfer comment: Deferred (pt hoyer lift at baseline)    Ambulation/Gait                    Stairs             Wheelchair Mobility     Tilt Bed    Modified Rankin (Stroke Patients Only)       Balance Overall balance assessment: Modified Independent                                          Communication Communication Communication: Impaired Factors Affecting Communication: Difficulty expressing self  Cognition Arousal: Alert Behavior During Therapy: WFL for tasks assessed/performed   PT - Cognitive impairments: History of cognitive impairments                         Following commands: Intact Following commands impaired: Follows multi-step commands with increased time    Cueing Cueing Techniques: Verbal cues  Exercises      General Comments General comments (skin integrity, edema, etc.): Steady long-sitting in bed.  Pt's daughter present during session.      Pertinent Vitals/Pain Pain Assessment Pain Assessment: No/denies pain    Home Living Family/patient expects to be discharged to:: Assisted living                        Prior Function            PT Goals (current goals can now be found in the care plan section) Acute Rehab  PT Goals Patient Stated Goal: to improve strength for bed mobility PT Goal Formulation: With patient Time For Goal Achievement: 09/04/23 Potential to Achieve Goals: Good Progress towards PT goals: Goals met/education completed, patient discharged from PT    Frequency    Other (Comment) (DC from PT in house)      PT Plan      Co-evaluation              AM-PAC PT "6 Clicks" Mobility   Outcome Measure  Help needed turning from your back to your side while in a flat bed without using bedrails?: None Help needed moving from lying on your back to sitting on the side of a flat bed without using bedrails?: A Little Help needed moving to and from a bed to a chair (including a wheelchair)?: Total Help needed standing up from a chair using your arms (e.g., wheelchair  or bedside chair)?: Total Help needed to walk in hospital room?: Total Help needed climbing 3-5 steps with a railing? : Total 6 Click Score: 11    End of Session   Activity Tolerance: Patient tolerated treatment well Patient left: in bed;with call bell/phone within reach;with bed alarm set;with family/visitor present Nurse Communication: Mobility status;Precautions (Dried blood noted on pt's gown on lower R side (unsure of origin--unable to find source--nurse notified)) PT Visit Diagnosis: Muscle weakness (generalized) (M62.81)     Time: 1610-9604 PT Time Calculation (min) (ACUTE ONLY): 13 min  Charges:    $Therapeutic Activity: 8-22 mins PT General Charges $$ ACUTE PT VISIT: 1 Visit                     Hendricks Limes, PT 08/24/23, 4:28 PM

## 2023-08-24 NOTE — Progress Notes (Signed)
 Called patients daughter, Britta Mccreedy [phone] to notify transportation team has arrived to transport patient to facility. No answer.   No acute distress noted. Patient stable for transport. No needs identified.

## 2023-08-24 NOTE — Plan of Care (Signed)
  Problem: Education: Goal: Knowledge of General Education information will improve Description: Including pain rating scale, medication(s)/side effects and non-pharmacologic comfort measures Outcome: Adequate for Discharge   Problem: Health Behavior/Discharge Planning: Goal: Ability to manage health-related needs will improve Outcome: Adequate for Discharge   Problem: Clinical Measurements: Goal: Ability to maintain clinical measurements within normal limits will improve Outcome: Adequate for Discharge Goal: Will remain free from infection Outcome: Adequate for Discharge Goal: Diagnostic test results will improve Outcome: Adequate for Discharge Goal: Cardiovascular complication will be avoided Outcome: Adequate for Discharge   Problem: Activity: Goal: Risk for activity intolerance will decrease Outcome: Adequate for Discharge   Problem: Nutrition: Goal: Adequate nutrition will be maintained Outcome: Adequate for Discharge   Problem: Coping: Goal: Level of anxiety will decrease Outcome: Adequate for Discharge   Problem: Elimination: Goal: Will not experience complications related to bowel motility Outcome: Adequate for Discharge Goal: Will not experience complications related to urinary retention Outcome: Adequate for Discharge

## 2023-08-24 NOTE — Discharge Summary (Signed)
 Physician Discharge Summary  Melissa Mcdonald ZOX:[id number] DOB: 28-May-1941 DOA: 08/20/2023  PCP: [facility]  Admit date: 08/20/2023 Discharge date: 08/24/2023  Admitted From: WOM Disposition:  WOM  Recommendations for Outpatient Follow-up:  Follow up with PCP in 1-2 weeks   Home Health:No Equipment/Devices:None   Discharge Condition:Stable  CODE STATUS:DNR  Diet recommendation: Carb mod  Brief/Interim Summary:  83 y.o. 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  Limited history in setting of encephalopathy.  Per report, patient, from local nursing facility.  Per report, patient with shortness of breath.  Was placed on 7 L nasal cannula normally on 6 L.  No reports of falls, head trauma, fevers chills or loss conscious.  At present, patient denies any chest pain.  Noted baseline history of chronic HFpEF as well as chronic respiratory failure.  No reports of recent change in medication regimen.  No reports of high salt intake.  No reports of recent NSAID use.  Daughter does report recent ?MRSA infection status post course of antibiotics (?MRSA screen).  Daughter also reports prior episodes of confusion associated with infection in the past. Presented to the ER afebrile, hemodynamically stable.  Satting in mid 90s on 2 to 4 L at present.  White count 11.5, hemoglobin 11.7, platelets 256, creatinine 1.74.  BNP 47.  EKG sinus rhythm.  Chest x-ray with cardiomegaly.      Discharge Diagnoses:  Principal Problem:   Acute respiratory failure with hypoxia (HCC) Active Problems:   Encephalopathy   COPD (chronic obstructive pulmonary disease) (HCC)   Type 2 diabetes mellitus (HCC)   Hypertension    History of breast cancer   CKD (chronic kidney disease), stage III (HCC)   Lumbar foraminal stenosis  Acute metabolic encephalopathy Orientation slowly improving Noted to have significant pyuria on admission MRI negative for CVA Interestingly urine culture with no growth.  Likely affected by previous exposure to antibiotic Plan: Completed course of IV Rocephin.  Urine culture with no growth.  Likely impacted by previous exposure antibiotics.  Stable for discharge.  No indication for antibiotics on DC   Acute  respiratory failure with hypoxia (HCC) Acute on chronic HFpEF (2D echo November 2019 with EF of 60 to 65%) Pulmonary hypertension Decompensated respiratory status, initially required 2 L nasal cannula in the setting of acute on chronic HFpEF and pulmonary hypertension Now on room air Chest x-ray showed cardiomegaly with pulmonary vascular congestion BNP within normal limits Patient received a dose of Lasix 20 mg IV and developed hypotension requiring fluid resuscitation 2D echocardiogram reassuring.  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  Discharge Instructions  Discharge Instructions     Diet - low sodium heart healthy   Complete by: As directed    Increase  activity slowly   Complete by: As directed       Allergies as of 08/24/2023   No Known Allergies      Medication List     STOP taking these medications    albuterol 108 (90 Base) MCG/ACT inhaler Commonly known as: VENTOLIN HFA   amoxicillin-clavulanate 500-125 MG tablet Commonly known as: AUGMENTIN   cefTRIAXone 1 g injection Commonly known as: ROCEPHIN   fluconazole 150 MG tablet Commonly known as: DIFLUCAN   sulfamethoxazole-trimethoprim 800-160 MG tablet Commonly known as: BACTRIM DS       TAKE these medications    allopurinol 300 MG tablet Commonly known as: ZYLOPRIM Take 300 mg by mouth daily. What changed: Another medication with the same name was removed. Continue taking this medication, and follow the directions you see here.   ALPRAZolam 0.25 MG tablet Commonly known as: XANAX Take 1 tablet (0.25 mg total) by mouth at bedtime as needed for up to 2 days for anxiety or sleep. For use at [facility] only.  Refills per facility MD What changed: additional instructions   busPIRone 5 MG tablet Commonly known as: BUSPAR Take 5 mg by mouth 3 (three) times daily.   desonide 0.05 % cream Commonly known as: DESOWEN Apply 1 Application topically 2 (two) times daily.   DULoxetine 60 MG capsule Commonly known as: CYMBALTA Take 60 mg by mouth daily.   Entresto 49-51 MG Generic drug: sacubitril-valsartan Take 1 tablet by mouth 2 (two) times daily.   fluticasone-salmeterol 250-50 MCG/ACT Aepb Commonly known as: ADVAIR Inhale 1 puff into the lungs 2 (two) times daily.   gabapentin 300 MG capsule Commonly known as: NEURONTIN Take 300 mg by mouth 3 (three) times daily.   Gerhardt's butt cream Crea Apply 1 Application topically 3 (three) times daily.   guaifenesin 100 MG/5ML syrup Commonly known as: ROBITUSSIN Take 200 mg by mouth 3 (three) times daily as needed for cough.   HYDROcodone-acetaminophen 7.5-325 MG tablet Commonly known as: NORCO Take  1 tablet by mouth every 6 (six) hours as needed for moderate pain (pain score 4-6) or severe pain (pain score 7-10). For use at [facility] only.  Refills per facility MD What changed: additional instructions   hydrOXYzine 10 MG tablet Commonly known as: ATARAX Take 10 mg by mouth every 4 (four) hours as needed.   Incruse Ellipta 62.5 MCG/ACT Aepb Generic drug: umeclidinium bromide Inhale 1 puff into the lungs daily.   insulin glargine-yfgn 100 UNIT/ML Pen Commonly known as: SEMGLEE Inject 12 Units into the skin at bedtime.   Jardiance 10 MG Tabs tablet Generic drug: empagliflozin Take 10 mg by mouth daily.   letrozole 2.5 MG tablet Commonly known as: FEMARA Take 2.5 mg by mouth daily.   lidocaine 1 % (with preservative) injection Commonly known as: XYLOCAINE Inject 10 mLs into the skin once.   lidocaine 5 % ointment Commonly known as: XYLOCAINE Apply 1 Application topically 3 (three) times daily as needed for mild pain (pain score 1-3).   metoprolol tartrate 50 MG tablet Commonly known as: LOPRESSOR Take 50 mg by mouth daily.   nystatin cream Commonly known as: MYCOSTATIN Apply topically 2 (two) times daily for 7 days.   Refresh Liquigel 1 % Gel Generic drug: Carboxymethylcellulose Sodium Apply 1 drop to eye at bedtime.  senna 8.6 MG Tabs tablet Commonly known as: SENOKOT Take 2 tablets by mouth daily.   simvastatin 10 MG tablet Commonly known as: ZOCOR Take 10 mg by mouth at bedtime.   torsemide 20 MG tablet Commonly known as: DEMADEX Take 20 mg by mouth daily.   venlafaxine XR 150 MG 24 hr capsule Commonly known as: EFFEXOR-XR Take 150 mg by mouth daily.   Vitamin D3 125 MCG (5000 UT) Caps Take 1 capsule by mouth daily.        Follow-up Information     [facility] Follow up.   Specialty: Geriatric Medicine Why: Hospital follow up Contact information: [address] [place] [address] [phone]                 No Known Allergies  Consultations: None   Procedures/Studies: MR BRAIN WO CONTRAST Result Date: 08/21/2023 CLINICAL DATA:  Initial evaluation for suspected stroke, confusion. EXAM: MRI HEAD WITHOUT CONTRAST TECHNIQUE: Multiplanar, multiecho pulse sequences of the brain and surrounding structures were obtained without intravenous contrast. COMPARISON:  CT from 08/20/2023 FINDINGS: Brain: Examination degraded by motion artifact. Cerebral volume within normal limits. Patchy T2/FLAIR hyperintensity involving the periventricular white matter, most characteristic of chronic microvascular ischemic disease, mild for age. No evidence for acute or subacute infarct. No areas of chronic cortical infarction. No acute or chronic intracranial blood products. No mass lesion, midline shift or mass effect. No hydrocephalus or extra-axial fluid collection. Pituitary gland mildly prominent with convex border superiorly but no discrete lesion. Vascular: Major intracranial vascular flow voids are grossly maintained. Skull and upper cervical spine: Craniocervical junction within normal limits. Bone marrow signal intensity normal. No scalp soft tissue abnormality. Sinuses/Orbits: Prior bilateral ocular lens replacement. Paranasal sinuses are largely clear. No significant mastoid effusion. Other: Left parotid gland is hypoplastic and/or absent. IMPRESSION: 1. No acute intracranial abnormality. 2. Mild chronic microvascular ischemic disease for age. Electronically Signed   By: Rise Mu M.D.   On: 08/21/2023 19:57   ECHOCARDIOGRAM COMPLETE Result Date: 08/21/2023    ECHOCARDIOGRAM REPORT   Patient Name:   Melissa Mcdonald Date of Exam: 08/21/2023 Medical Rec #:  [id number]       Height:       60.0 in Accession #:    [id number]      Weight:       196.9 lb Date of Birth:  07/02/1940        BSA:          1.854 m Patient Age:    83 years        BP:           134/68 mmHg Patient Gender: F               HR:            93 bpm. Exam Location:  [facility] Procedure: 2D Echo, Cardiac Doppler and Color Doppler (Both Spectral and Color            Flow Doppler were utilized during procedure). Indications:     CHF  History:         Patient has no prior history of Echocardiogram examinations.                  CHF, COPD; Risk Factors:Hypertension and Diabetes. CKD.  Sonographer:     Mikki Harbor Referring Phys:  [phone] Francoise Schaumann [place] Diagnosing Phys: Yvonne Kendall MD  Sonographer Comments: Technically difficult study due to poor  echo windows. Image acquisition challenging due to COPD. IMPRESSIONS  1. Left ventricular ejection fraction, by estimation, is 60 to 65%. The left ventricle has normal function. The left ventricle has no regional wall motion abnormalities. Left ventricular diastolic parameters are consistent with Grade I diastolic dysfunction (impaired relaxation).  2. Right ventricular systolic function is normal. The right ventricular size is normal. Mildly increased right ventricular wall thickness.  3. The mitral valve is normal in structure. No evidence of mitral valve regurgitation. No evidence of mitral stenosis.  4. The aortic valve is tricuspid. There is mild calcification of the aortic valve. There is mild thickening of the aortic valve. Aortic valve regurgitation is not visualized. Aortic valve sclerosis is present, with no evidence of aortic valve stenosis. FINDINGS  Left Ventricle: Left ventricular ejection fraction, by estimation, is 60 to 65%. The left ventricle has normal function. The left ventricle has no regional wall motion abnormalities. The left ventricular internal cavity size was normal in size. There is  borderline left ventricular hypertrophy. Left ventricular diastolic parameters are consistent with Grade I diastolic dysfunction (impaired relaxation). Right Ventricle: The right ventricular size is normal. Mildly increased right ventricular wall thickness. Right ventricular systolic function is normal.  Left Atrium: Left atrial size was normal in size. Right Atrium: Right atrial size was normal in size. Pericardium: Trivial pericardial effusion is present. Mitral Valve: The mitral valve is normal in structure. No evidence of mitral valve regurgitation. No evidence of mitral valve stenosis. MV peak gradient, 3.4 mmHg. The mean mitral valve gradient is 2.0 mmHg. Tricuspid Valve: The tricuspid valve is grossly normal. Tricuspid valve regurgitation is trivial. No evidence of tricuspid stenosis. Aortic Valve: The aortic valve is tricuspid. There is mild calcification of the aortic valve. There is mild thickening of the aortic valve. Aortic valve regurgitation is not visualized. Aortic valve sclerosis is present, with no evidence of aortic valve stenosis. Aortic valve mean gradient measures 6.0 mmHg. Aortic valve peak gradient measures 12.1 mmHg. Aortic valve area, by VTI measures 2.23 cm. Pulmonic Valve: The pulmonic valve was not well visualized. Pulmonic valve regurgitation is trivial. Aorta: The aortic root is normal in size and structure. Venous: The inferior vena cava was not well visualized. IAS/Shunts: The interatrial septum appears to be lipomatous. No atrial level shunt detected by color flow Doppler.  LEFT VENTRICLE PLAX 2D LVIDd:         4.20 cm     Diastology LVIDs:         2.10 cm     LV e' medial:    6.96 cm/s LV PW:         1.08 cm     LV E/e' medial:  7.9 LV IVS:        0.98 cm     LV e' lateral:   10.10 cm/s LVOT diam:     2.00 cm     LV E/e' lateral: 5.4 LV SV:         69 LV SV Index:   37 LVOT Area:     3.14 cm  LV Volumes (MOD) LV vol d, MOD A2C: 37.2 ml LV vol d, MOD A4C: 39.4 ml LV vol s, MOD A2C: 17.5 ml LV vol s, MOD A4C: 16.6 ml LV SV MOD A2C:     19.7 ml LV SV MOD A4C:     39.4 ml LV SV MOD BP:      20.8 ml RIGHT VENTRICLE RV Basal diam:  3.10 cm RV Mid  diam:    2.90 cm RV S prime:     15.40 cm/s LEFT ATRIUM             Index        RIGHT ATRIUM           Index LA diam:        3.90 cm 2.10  cm/m   RA Area:     15.50 cm LA Vol (A2C):   39.1 ml 21.09 ml/m  RA Volume:   40.40 ml  21.79 ml/m LA Vol (A4C):   49.8 ml 26.86 ml/m LA Biplane Vol: 44.4 ml 23.94 ml/m  AORTIC VALVE                     PULMONIC VALVE AV Area (Vmax):    2.37 cm      PV Vmax:       1.24 m/s AV Area (Vmean):   2.25 cm      PV Peak grad:  6.2 mmHg AV Area (VTI):     2.23 cm AV Vmax:           174.00 cm/s AV Vmean:          116.000 cm/s AV VTI:            0.309 m AV Peak Grad:      12.1 mmHg AV Mean Grad:      6.0 mmHg LVOT Vmax:         131.00 cm/s LVOT Vmean:        83.200 cm/s LVOT VTI:          0.219 m LVOT/AV VTI ratio: 0.71  AORTA Ao Root diam: 3.10 cm MITRAL VALVE               TRICUSPID VALVE MV Area (PHT): 4.44 cm    TR Peak grad:   28.1 mmHg MV Area VTI:   2.63 cm    TR Vmax:        265.00 cm/s MV Peak grad:  3.4 mmHg MV Mean grad:  2.0 mmHg    SHUNTS MV Vmax:       0.93 m/s    Systemic VTI:  0.22 m MV Vmean:      60.1 cm/s   Systemic Diam: 2.00 cm MV Decel Time: 171 msec MV E velocity: 54.80 cm/s MV A velocity: 87.10 cm/s MV E/A ratio:  0.63 Cristal Deer End MD Electronically signed by Yvonne Kendall MD Signature Date/Time: 08/21/2023/2:52:49 PM    Final    CT CHEST WO CONTRAST Result Date: 08/21/2023 CLINICAL DATA:  Pleural effusion. EXAM: CT CHEST WITHOUT CONTRAST TECHNIQUE: Multidetector CT imaging of the chest was performed following the standard protocol without IV contrast. RADIATION DOSE REDUCTION: This exam was performed according to the departmental dose-optimization program which includes automated exposure control, adjustment of the mA and/or kV according to patient size and/or use of iterative reconstruction technique. COMPARISON:  Chest radiograph dated 08/20/2023. FINDINGS: Evaluation of this exam is limited in the absence of intravenous contrast. Cardiovascular: There is no cardiomegaly or pericardial effusion. Two vessel coronary vascular calcification. Mild atherosclerotic calcification of the  thoracic aorta. No aneurysmal dilatation. The central pulmonary arteries are grossly unremarkable. Mediastinum/Nodes: No hilar or mediastinal adenopathy. The esophagus is grossly unremarkable. There is a 3 cm left thyroid nodule. This has been evaluated on previous imaging. (ref: J Am Coll Radiol. 2015 Feb;12(2): 143-50).No mediastinal fluid collection. Lungs/Pleura: Left apical subpleural scarring. No focal consolidation, pleural effusion,  or pneumothorax. The central airways are patent. Upper Abdomen: Multiple gallstones. Musculoskeletal: Osteopenia with degenerative changes of the spine. No acute osseous pathology. IMPRESSION: 1. No acute intrathoracic pathology. No pleural effusion. 2. Cholelithiasis. Electronically Signed   By: Elgie Collard M.D.   On: 08/21/2023 11:23   CT HEAD WO CONTRAST ( ) Result Date: 08/20/2023 CLINICAL DATA:  Initial evaluation for mental status change, unknown cause. EXAM: CT HEAD WITHOUT CONTRAST TECHNIQUE: Contiguous axial images were obtained from the base of the skull through the vertex without intravenous contrast. RADIATION DOSE REDUCTION: This exam was performed according to the departmental dose-optimization program which includes automated exposure control, adjustment of the mA and/or kV according to patient size and/or use of iterative reconstruction technique. COMPARISON:  Prior study from 06/12/2017 FINDINGS: Brain: Generalized age-related cerebral atrophy with mild chronic microvascular ischemic disease. No acute intracranial hemorrhage. No acute large vessel territory infarct. No mass lesion, midline shift or mass effect. No hydrocephalus or extra-axial fluid collection. Vascular: No abnormal hyperdense vessel. Scattered vascular calcifications noted within the carotid siphons. Skull: Scalp soft tissues demonstrate no acute finding. Calvarium intact. Sinuses/Orbits: Globes orbital soft tissues within normal limits. Paranasal sinuses are largely clear. No mastoid  effusion. Other: None. IMPRESSION: 1. No acute intracranial abnormality. 2. Generalized age-related cerebral atrophy with mild chronic small vessel ischemic disease. Electronically Signed   By: Rise Mu M.D.   On: 08/20/2023 22:02   DG Chest Portable 1 View Result Date: 08/20/2023 CLINICAL DATA:  Shortness of breath.  Edema. EXAM: PORTABLE CHEST 1 VIEW COMPARISON:  X-ray 07/30/2017. FINDINGS: Enlarged cardiopericardial silhouette with vascular congestion. No pneumothorax or effusion. No consolidation. Prominent central vasculature. No edema. Films are under penetrated underinflated. Overlapping cardiac leads. Degenerative changes of the spine. IMPRESSION: Enlarged cardiopericardial silhouette with some vascular congestion. Limited radiograph. Electronically Signed   By: Karen Kays M.D.   On: 08/20/2023 13:50      Subjective: Seen and examined on the day of discharge.  Stable no distress.  Appropriate discharge back to long-term care.  Discharge Exam: Vitals:   08/24/23 0425 08/24/23 0801  BP: (!) 102/53 (!) 101/58  Pulse: 92 73  Resp: 20 17  Temp: 98.6 F (37 C) 98.3 F (36.8 C)  SpO2: 91% 92%   Vitals:   08/23/23 2038 08/24/23 0425 08/24/23 0500 08/24/23 0801  BP: 125/64 (!) 102/53  (!) 101/58  Pulse: 88 92  73  Resp: 20 20  17   Temp: 97.9 F (36.6 C) 98.6 F (37 C)  98.3 F (36.8 C)  TempSrc:  Oral  Oral  SpO2: 93% 91%  92%  Weight:   88 kg   Height:        General: Pt is alert, awake, not in acute distress Cardiovascular: RRR, S1/S2 +, no rubs, no gallops Respiratory: CTA bilaterally, no wheezing, no rhonchi Abdominal: Soft, NT, ND, bowel sounds + Extremities: Left AKA    The results of significant diagnostics from this hospitalization (including imaging, microbiology, ancillary and laboratory) are listed below for reference.     Microbiology: Recent Results (from the past 240 hours)  Urine Culture (for pregnant, neutropenic or urologic patients or  patients with an indwelling urinary catheter)     Status: None   Collection Time: 08/21/23  1:09 PM   Specimen: Urine, Clean Catch  Result Value Ref Range Status   Specimen Description   Final    URINE, CLEAN CATCH Performed at [facility], [address], [place], [place] [address]  Special Requests   Final    NONE Performed at [facility], [address], [place], [place] [address]    Culture   Final    NO GROWTH Performed at [facility], [address], [place], [place] [address]    Report Status 08/22/2023 FINAL  Final     Labs: BNP (last 3 results) Recent Labs    08/20/23 1308  BNP 47.4   Basic Metabolic Panel: Recent Labs  Lab 08/20/23 1308 08/21/23 0436 08/23/23 1104  NA 141 138 137  K 4.4 3.6 4.0  CL 101 102 105  CO2 31 27 22   GLUCOSE 119* 101* 188*  BUN 29* 28* 20  CREATININE 1.74* 1.61* 1.20*  CALCIUM 8.6* 8.6* 8.8*   Liver Function Tests: Recent Labs  Lab 08/20/23 1710 08/21/23 0436  AST 15 15  ALT 15 15  ALKPHOS 66 56  BILITOT 0.5 0.8  PROT 7.0 6.4*  ALBUMIN 3.3* 3.1*   No results for input(s): "LIPASE", "AMYLASE" in the last 168 hours. Recent Labs  Lab 08/20/23 1713  AMMONIA 14   CBC: Recent Labs  Lab 08/20/23 1308 08/21/23 0436 08/23/23 1104  WBC 11.5* 11.0* 10.2  NEUTROABS 8.8*  --  7.7  HGB 11.7* 11.1* 12.6  HCT 37.4 34.1* 37.8  MCV 105.6* 101.8* 96.4  PLT 256 246 245   Cardiac Enzymes: No results for input(s): "CKTOTAL", "CKMB", "CKMBINDEX", "TROPONINI" in the last 168 hours. BNP: Invalid input(s): "POCBNP" CBG: Recent Labs  Lab 08/23/23 1156 08/23/23 1710 08/23/23 2042 08/24/23 0804 08/24/23 1237  GLUCAP 164* 93 135* 128* 197*   D-Dimer No results for input(s): "DDIMER" in the last 72 hours. Hgb A1c No results for input(s): "HGBA1C" in the last 72 hours. Lipid Profile No results for input(s): "CHOL", "HDL", "LDLCALC", "TRIG", "CHOLHDL", "LDLDIRECT" in the last 72 hours. Thyroid  function studies No results for input(s): "TSH", "T4TOTAL", "T3FREE", "THYROIDAB" in the last 72 hours.  Invalid input(s): "FREET3" Anemia work up No results for input(s): "VITAMINB12", "FOLATE", "FERRITIN", "TIBC", "IRON", "RETICCTPCT" in the last 72 hours. Urinalysis    Component Value Date/Time   COLORURINE YELLOW (A) 08/20/2023 1920   APPEARANCEUR HAZY (A) 08/20/2023 1920   LABSPEC 1.009 08/20/2023 1920   PHURINE 6.0 08/20/2023 1920   GLUCOSEU 150 (A) 08/20/2023 1920   HGBUR NEGATIVE 08/20/2023 1920   BILIRUBINUR NEGATIVE 08/20/2023 1920   KETONESUR NEGATIVE 08/20/2023 1920   PROTEINUR NEGATIVE 08/20/2023 1920   NITRITE NEGATIVE 08/20/2023 1920   LEUKOCYTESUR LARGE (A) 08/20/2023 1920   Sepsis Labs Recent Labs  Lab 08/20/23 1308 08/21/23 0436 08/23/23 1104  WBC 11.5* 11.0* 10.2   Microbiology Recent Results (from the past 240 hours)  Urine Culture (for pregnant, neutropenic or urologic patients or patients with an indwelling urinary catheter)     Status: None   Collection Time: 08/21/23  1:09 PM   Specimen: Urine, Clean Catch  Result Value Ref Range Status   Specimen Description   Final    URINE, CLEAN CATCH Performed at [facility], [address], [place], [place] [address]    Special Requests   Final    NONE Performed at [facility], [address], [place], [place] [address]    Culture   Final    NO GROWTH Performed at [facility], [address], [place], [place] [address]    Report Status 08/22/2023 FINAL  Final     Time coordinating discharge: Over 30 minutes  SIGNED:  Tresa Moore, MD  [facility] 08/24/2023, 3:03 PM Pager   If 7PM-7AM, please contact night-coverage

## 2023-08-24 NOTE — TOC Transition Note (Signed)
 Transition of Care [facility]) - Discharge Note   Patient Details  Name: Melissa Mcdonald MRN: [id number] Date of Birth: [birth year]  Transition of Care [facility]) CM/SW Contact:  Cherre Blanc, RN Phone Number: 08/24/2023, 4:06 PM   Clinical Narrative:    Patient is medically clear to dc back to [facility]. The patient's daughter is in agreement with the dc plan. Transportation back to [facility] arranged with [employer] No other dc needs identified.      Barriers to Discharge: Continued Medical Work up   Patient Goals and CMS Choice            Discharge Placement                       Discharge Plan and Services Additional resources added to the After Visit Summary for                                       Social Drivers of Health (SDOH) Interventions SDOH Screenings   Food Insecurity: No Food Insecurity (08/20/2023)  Housing: Low Risk  (08/20/2023)  Transportation Needs: No Transportation Needs (08/20/2023)  Utilities: Not At Risk (08/20/2023)  Social Connections: Socially Isolated (08/20/2023)  Tobacco Use: Low Risk  (08/20/2023)     Readmission Risk Interventions     No data to display

## 2023-08-27 ENCOUNTER — Encounter: Payer: Self-pay | Admitting: Hematology and Oncology

## 2023-09-28 ENCOUNTER — Encounter: Payer: Self-pay | Admitting: Hematology and Oncology

## 2023-10-19 ENCOUNTER — Emergency Department

## 2023-10-19 ENCOUNTER — Other Ambulatory Visit: Payer: Self-pay

## 2023-10-19 ENCOUNTER — Inpatient Hospital Stay
Admission: EM | Admit: 2023-10-19 | Discharge: 2023-10-22 | DRG: 689 | Disposition: A | Discharge status: Skilled Nursing Facility | Source: Skilled Nursing Facility | Attending: Internal Medicine | Admitting: Internal Medicine

## 2023-10-19 ENCOUNTER — Observation Stay

## 2023-10-19 DIAGNOSIS — Z7984 Long term (current) use of oral hypoglycemic drugs: Secondary | ICD-10-CM

## 2023-10-19 DIAGNOSIS — Z993 Dependence on wheelchair: Secondary | ICD-10-CM

## 2023-10-19 DIAGNOSIS — Z7951 Long term (current) use of inhaled steroids: Secondary | ICD-10-CM

## 2023-10-19 DIAGNOSIS — I130 Hypertensive heart and chronic kidney disease with heart failure and stage 1 through stage 4 chronic kidney disease, or unspecified chronic kidney disease: Secondary | ICD-10-CM | POA: Diagnosis present

## 2023-10-19 DIAGNOSIS — Z7401 Bed confinement status: Secondary | ICD-10-CM

## 2023-10-19 DIAGNOSIS — Z923 Personal history of irradiation: Secondary | ICD-10-CM

## 2023-10-19 DIAGNOSIS — I5032 Chronic diastolic (congestive) heart failure: Secondary | ICD-10-CM | POA: Diagnosis present

## 2023-10-19 DIAGNOSIS — E1169 Type 2 diabetes mellitus with other specified complication: Secondary | ICD-10-CM

## 2023-10-19 DIAGNOSIS — G4733 Obstructive sleep apnea (adult) (pediatric): Secondary | ICD-10-CM | POA: Diagnosis present

## 2023-10-19 DIAGNOSIS — S82491A Other fracture of shaft of right fibula, initial encounter for closed fracture: Secondary | ICD-10-CM | POA: Diagnosis present

## 2023-10-19 DIAGNOSIS — C50811 Malignant neoplasm of overlapping sites of right female breast: Secondary | ICD-10-CM | POA: Diagnosis present

## 2023-10-19 DIAGNOSIS — J449 Chronic obstructive pulmonary disease, unspecified: Secondary | ICD-10-CM | POA: Diagnosis present

## 2023-10-19 DIAGNOSIS — B9620 Unspecified Escherichia coli [E. coli] as the cause of diseases classified elsewhere: Secondary | ICD-10-CM | POA: Diagnosis present

## 2023-10-19 DIAGNOSIS — E785 Hyperlipidemia, unspecified: Secondary | ICD-10-CM | POA: Diagnosis present

## 2023-10-19 DIAGNOSIS — Z888 Allergy status to other drugs, medicaments and biological substances status: Secondary | ICD-10-CM

## 2023-10-19 DIAGNOSIS — Z604 Social exclusion and rejection: Secondary | ICD-10-CM | POA: Diagnosis present

## 2023-10-19 DIAGNOSIS — Z79811 Long term (current) use of aromatase inhibitors: Secondary | ICD-10-CM

## 2023-10-19 DIAGNOSIS — G9341 Metabolic encephalopathy: Principal | ICD-10-CM | POA: Diagnosis present

## 2023-10-19 DIAGNOSIS — E538 Deficiency of other specified B group vitamins: Secondary | ICD-10-CM | POA: Diagnosis present

## 2023-10-19 DIAGNOSIS — Z66 Do not resuscitate: Secondary | ICD-10-CM | POA: Diagnosis present

## 2023-10-19 DIAGNOSIS — E1151 Type 2 diabetes mellitus with diabetic peripheral angiopathy without gangrene: Secondary | ICD-10-CM | POA: Diagnosis present

## 2023-10-19 DIAGNOSIS — I2721 Secondary pulmonary arterial hypertension: Secondary | ICD-10-CM | POA: Diagnosis present

## 2023-10-19 DIAGNOSIS — N390 Urinary tract infection, site not specified: Principal | ICD-10-CM | POA: Diagnosis present

## 2023-10-19 DIAGNOSIS — Z89612 Acquired absence of left leg above knee: Secondary | ICD-10-CM

## 2023-10-19 DIAGNOSIS — F32A Depression, unspecified: Secondary | ICD-10-CM | POA: Diagnosis present

## 2023-10-19 DIAGNOSIS — E1142 Type 2 diabetes mellitus with diabetic polyneuropathy: Secondary | ICD-10-CM | POA: Diagnosis present

## 2023-10-19 DIAGNOSIS — Z886 Allergy status to analgesic agent status: Secondary | ICD-10-CM

## 2023-10-19 DIAGNOSIS — Z87440 Personal history of urinary (tract) infections: Secondary | ICD-10-CM

## 2023-10-19 DIAGNOSIS — S82201A Unspecified fracture of shaft of right tibia, initial encounter for closed fracture: Secondary | ICD-10-CM

## 2023-10-19 DIAGNOSIS — W19XXXA Unspecified fall, initial encounter: Secondary | ICD-10-CM | POA: Diagnosis present

## 2023-10-19 DIAGNOSIS — M109 Gout, unspecified: Secondary | ICD-10-CM | POA: Diagnosis present

## 2023-10-19 DIAGNOSIS — E041 Nontoxic single thyroid nodule: Secondary | ICD-10-CM | POA: Diagnosis present

## 2023-10-19 DIAGNOSIS — D631 Anemia in chronic kidney disease: Secondary | ICD-10-CM | POA: Diagnosis present

## 2023-10-19 DIAGNOSIS — E1122 Type 2 diabetes mellitus with diabetic chronic kidney disease: Secondary | ICD-10-CM | POA: Diagnosis present

## 2023-10-19 DIAGNOSIS — Z8249 Family history of ischemic heart disease and other diseases of the circulatory system: Secondary | ICD-10-CM

## 2023-10-19 DIAGNOSIS — S82191A Other fracture of upper end of right tibia, initial encounter for closed fracture: Secondary | ICD-10-CM | POA: Diagnosis present

## 2023-10-19 DIAGNOSIS — Z79899 Other long term (current) drug therapy: Secondary | ICD-10-CM

## 2023-10-19 DIAGNOSIS — N1832 Chronic kidney disease, stage 3b: Secondary | ICD-10-CM | POA: Diagnosis present

## 2023-10-19 DIAGNOSIS — E669 Obesity, unspecified: Secondary | ICD-10-CM | POA: Diagnosis present

## 2023-10-19 DIAGNOSIS — Z794 Long term (current) use of insulin: Secondary | ICD-10-CM

## 2023-10-19 DIAGNOSIS — Z6837 Body mass index (BMI) 37.0-37.9, adult: Secondary | ICD-10-CM

## 2023-10-19 DIAGNOSIS — Z96651 Presence of right artificial knee joint: Secondary | ICD-10-CM | POA: Diagnosis present

## 2023-10-19 DIAGNOSIS — Z85828 Personal history of other malignant neoplasm of skin: Secondary | ICD-10-CM

## 2023-10-19 LAB — CBC WITH DIFFERENTIAL/PLATELET
Abs Immature Granulocytes: 0.03 10*3/uL (ref 0.00–0.07)
Basophils Absolute: 0.1 10*3/uL (ref 0.0–0.1)
Basophils Relative: 1 %
Eosinophils Absolute: 0.5 10*3/uL (ref 0.0–0.5)
Eosinophils Relative: 5 %
HCT: 36.4 % (ref 36.0–46.0)
Hemoglobin: 11.8 g/dL — ABNORMAL LOW (ref 12.0–15.0)
Immature Granulocytes: 0 %
Lymphocytes Relative: 17 %
Lymphs Abs: 1.7 10*3/uL (ref 0.7–4.0)
MCH: 32.5 pg (ref 26.0–34.0)
MCHC: 32.4 g/dL (ref 30.0–36.0)
MCV: 100.3 fL — ABNORMAL HIGH (ref 80.0–100.0)
Monocytes Absolute: 0.7 10*3/uL (ref 0.1–1.0)
Monocytes Relative: 7 %
Neutro Abs: 6.8 10*3/uL (ref 1.7–7.7)
Neutrophils Relative %: 70 %
Platelets: 264 10*3/uL (ref 150–400)
RBC: 3.63 MIL/uL — ABNORMAL LOW (ref 3.87–5.11)
RDW: 13.9 % (ref 11.5–15.5)
WBC: 9.9 10*3/uL (ref 4.0–10.5)
nRBC: 0 % (ref 0.0–0.2)

## 2023-10-19 LAB — URINALYSIS, ROUTINE W REFLEX MICROSCOPIC
Bilirubin Urine: NEGATIVE
Glucose, UA: >=500 mg/dL — AB
Ketones, ur: NEGATIVE mg/dL
Nitrite: NEGATIVE
Protein, ur: 30 mg/dL — AB
Specific Gravity, Urine: 1.009 (ref 1.005–1.030)
Squamous Epithelial / HPF: 0 /HPF (ref 0–5)
WBC, UA: >50 WBC/hpf (ref 0–5)
pH: 5 (ref 5.0–8.0)

## 2023-10-19 LAB — COMPREHENSIVE METABOLIC PANEL WITH GFR
ALT: 14 U/L (ref 0–44)
AST: 13 U/L — ABNORMAL LOW (ref 15–41)
Albumin: 3.3 g/dL — ABNORMAL LOW (ref 3.5–5.0)
Alkaline Phosphatase: 70 U/L (ref 38–126)
Anion gap: 10 (ref 5–15)
BUN: 27 mg/dL — ABNORMAL HIGH (ref 8–23)
CO2: 29 mmol/L (ref 22–32)
Calcium: 8.8 mg/dL — ABNORMAL LOW (ref 8.9–10.3)
Chloride: 99 mmol/L (ref 98–111)
Creatinine, Ser: 1.5 mg/dL — ABNORMAL HIGH (ref 0.44–1.00)
GFR, Estimated: 35 mL/min — ABNORMAL LOW (ref >60)
Glucose, Bld: 118 mg/dL — ABNORMAL HIGH (ref 70–99)
Potassium: 3.9 mmol/L (ref 3.5–5.1)
Sodium: 138 mmol/L (ref 135–145)
Total Bilirubin: 0.7 mg/dL (ref 0.0–1.2)
Total Protein: 6.9 g/dL (ref 6.5–8.1)

## 2023-10-19 LAB — GLUCOSE, CAPILLARY
Glucose-Capillary: 130 mg/dL — ABNORMAL HIGH (ref 70–99)
Glucose-Capillary: 99 mg/dL (ref 70–99)

## 2023-10-19 LAB — PROTIME-INR
INR: 1.1 (ref 0.8–1.2)
Prothrombin Time: 14.4 s (ref 11.4–15.2)

## 2023-10-19 LAB — LACTIC ACID, PLASMA: Lactic Acid, Venous: 0.8 mmol/L (ref 0.5–1.9)

## 2023-10-19 MED ORDER — MORPHINE SULFATE (PF) 2 MG/ML IV SOLN
1.0000 mg | INTRAVENOUS | Status: DC | PRN
  Administered 2023-10-20: 1 mg via INTRAVENOUS
  Filled 2023-10-19: qty 1

## 2023-10-19 MED ORDER — IPRATROPIUM-ALBUTEROL 0.5-2.5 (3) MG/3ML IN SOLN: 3.0000 mL | Freq: Four times a day (QID) | RESPIRATORY_TRACT | Status: DC | PRN

## 2023-10-19 MED ORDER — ACETAMINOPHEN 650 MG RE SUPP: 650.0000 mg | Freq: Four times a day (QID) | RECTAL | Status: DC | PRN

## 2023-10-19 MED ORDER — VENLAFAXINE HCL ER 150 MG PO CP24
150.0000 mg | ORAL_CAPSULE | Freq: Every day | ORAL | Status: DC
  Administered 2023-10-20 – 2023-10-22 (×3): 150 mg via ORAL
  Filled 2023-10-19 (×3): qty 1

## 2023-10-19 MED ORDER — SODIUM CHLORIDE 0.9 % IV SOLN
1.0000 g | INTRAVENOUS | Status: DC
  Administered 2023-10-20 – 2023-10-21 (×2): 1 g via INTRAVENOUS
  Filled 2023-10-19 (×2): qty 10

## 2023-10-19 MED ORDER — BUSPIRONE HCL 10 MG PO TABS
5.0000 mg | ORAL_TABLET | Freq: Three times a day (TID) | ORAL | Status: DC
  Administered 2023-10-19 – 2023-10-22 (×9): 5 mg via ORAL
  Filled 2023-10-19 (×9): qty 1

## 2023-10-19 MED ORDER — LACTATED RINGERS IV BOLUS
1000.0000 mL | Freq: Once | INTRAVENOUS | Status: AC
  Administered 2023-10-19: 1000 mL via INTRAVENOUS

## 2023-10-19 MED ORDER — DOCUSATE SODIUM 100 MG PO CAPS
100.0000 mg | ORAL_CAPSULE | Freq: Two times a day (BID) | ORAL | Status: DC
  Administered 2023-10-19 – 2023-10-22 (×6): 100 mg via ORAL
  Filled 2023-10-19 (×6): qty 1

## 2023-10-19 MED ORDER — LETROZOLE 2.5 MG PO TABS
2.5000 mg | ORAL_TABLET | Freq: Every day | ORAL | Status: DC
  Administered 2023-10-20 – 2023-10-22 (×3): 2.5 mg via ORAL
  Filled 2023-10-19 (×3): qty 1

## 2023-10-19 MED ORDER — ALPRAZOLAM 0.25 MG PO TABS
0.2500 mg | ORAL_TABLET | Freq: Every evening | ORAL | Status: DC | PRN
  Administered 2023-10-19 – 2023-10-21 (×2): 0.25 mg via ORAL
  Filled 2023-10-19 (×2): qty 1

## 2023-10-19 MED ORDER — ONDANSETRON HCL 4 MG PO TABS: 4.0000 mg | ORAL_TABLET | Freq: Four times a day (QID) | ORAL | Status: DC | PRN

## 2023-10-19 MED ORDER — SODIUM CHLORIDE 0.9 % IV SOLN
1.0000 g | Freq: Once | INTRAVENOUS | Status: AC
  Administered 2023-10-19: 1 g via INTRAVENOUS
  Filled 2023-10-19: qty 10

## 2023-10-19 MED ORDER — DULOXETINE HCL 30 MG PO CPEP
60.0000 mg | ORAL_CAPSULE | Freq: Every day | ORAL | Status: DC
Start: 2023-10-19 — End: 2023-10-22
  Administered 2023-10-20 – 2023-10-22 (×3): 60 mg via ORAL
  Filled 2023-10-19 (×3): qty 2

## 2023-10-19 MED ORDER — INSULIN GLARGINE-YFGN 100 UNIT/ML ~~LOC~~ SOLN
5.0000 [IU] | Freq: Every day | SUBCUTANEOUS | Status: DC
  Administered 2023-10-19 – 2023-10-22 (×4): 5 [IU] via SUBCUTANEOUS
  Filled 2023-10-19 (×5): qty 0.05

## 2023-10-19 MED ORDER — SIMVASTATIN 20 MG PO TABS
10.0000 mg | ORAL_TABLET | Freq: Every day | ORAL | Status: DC
  Administered 2023-10-19 – 2023-10-22 (×4): 10 mg via ORAL
  Filled 2023-10-19 (×4): qty 1

## 2023-10-19 MED ORDER — INSULIN ASPART 100 UNIT/ML IJ SOLN: 0.0000 [IU] | Freq: Three times a day (TID) | INTRAMUSCULAR | Status: DC

## 2023-10-19 MED ORDER — UMECLIDINIUM BROMIDE 62.5 MCG/ACT IN AEPB
1.0000 | INHALATION_SPRAY | Freq: Every day | RESPIRATORY_TRACT | Status: DC
  Administered 2023-10-19: 1 via RESPIRATORY_TRACT
  Filled 2023-10-19: qty 7

## 2023-10-19 MED ORDER — HYDROCODONE-ACETAMINOPHEN 5-325 MG PO TABS
1.0000 | ORAL_TABLET | ORAL | Status: DC | PRN
  Administered 2023-10-19 – 2023-10-22 (×3): 1 via ORAL
  Filled 2023-10-19 (×3): qty 1

## 2023-10-19 MED ORDER — NYSTATIN 100000 UNIT/GM EX POWD
Freq: Two times a day (BID) | CUTANEOUS | Status: DC
  Filled 2023-10-19: qty 15

## 2023-10-19 MED ORDER — SODIUM CHLORIDE 0.9% FLUSH
3.0000 mL | Freq: Two times a day (BID) | INTRAVENOUS | Status: DC
  Administered 2023-10-19 – 2023-10-22 (×5): 3 mL via INTRAVENOUS

## 2023-10-19 MED ORDER — ENOXAPARIN SODIUM 40 MG/0.4ML IJ SOSY
40.0000 mg | PREFILLED_SYRINGE | INTRAMUSCULAR | Status: DC
  Administered 2023-10-19 – 2023-10-21 (×3): 40 mg via SUBCUTANEOUS
  Filled 2023-10-19 (×3): qty 0.4

## 2023-10-19 MED ORDER — GABAPENTIN 300 MG PO CAPS
300.0000 mg | ORAL_CAPSULE | Freq: Three times a day (TID) | ORAL | Status: DC
  Administered 2023-10-19 – 2023-10-22 (×9): 300 mg via ORAL
  Filled 2023-10-19 (×9): qty 1

## 2023-10-19 MED ORDER — ONDANSETRON HCL 4 MG/2ML IJ SOLN: 4.0000 mg | Freq: Four times a day (QID) | INTRAMUSCULAR | Status: DC | PRN

## 2023-10-19 MED ORDER — ACETAMINOPHEN 325 MG PO TABS
650.0000 mg | ORAL_TABLET | Freq: Four times a day (QID) | ORAL | Status: DC | PRN
  Administered 2023-10-21: 650 mg via ORAL
  Filled 2023-10-19: qty 2

## 2023-10-19 NOTE — ED Triage Notes (Signed)
 Pt here from [facility] for c/o of AMS, pt has HX of UTI's.   100/55 HR:62 98% RA  [id number]

## 2023-10-19 NOTE — ED Provider Notes (Signed)
 [facility] Provider Note    Event Date/Time   First MD Initiated Contact with Patient 10/19/23 1231     (approximate)   History   Altered Mental Status   HPI  Melissa Mcdonald is a 83 y.o. female who presents to the ED for evaluation of Altered Mental Status   Review medical DC summary from 2 months ago.  History of diastolic CHF, COPD and pulmonary hypertension on chronic 5-6 L.  Left AKA, DM and gout.  Admitted for encephalopathy  Patient presents from her facility due to altered mentation and concern for UTI.  Explicit concern for UTIs as she has acted this way in the past in the setting of these infections, she becomes altered, often just laughing unresponsive to questions.  She was doing this here in the ED and history is therefore limited.  She has no particular complaints and is uncertain why she is here.  No reported trauma, injuries, syncope, falls   Physical Exam   Triage Vital Signs: ED Triage Vitals  Encounter Vitals Group     BP 10/19/23 1236 (!) 101/58     Systolic BP Percentile --      Diastolic BP Percentile --      Pulse Rate 10/19/23 1236 62     Resp 10/19/23 1236 20     Temp 10/19/23 1236 98.1 F (36.7 C)     Temp Source 10/19/23 1236 Oral     SpO2 10/19/23 1236 98 %     Weight 10/19/23 1234 194 lb 0.1 oz (88 kg)     Height 10/19/23 1234 5' (1.524 m)     Head Circumference --      Peak Flow --      Pain Score --      Pain Loc --      Pain Education --      Exclude from Growth Chart --     Most recent vital signs: Vitals:   10/19/23 1236  BP: (!) 101/58  Pulse: 62  Resp: 20  Temp: 98.1 F (36.7 C)  SpO2: 98%    General: Awake, no distress.  Dry mucous membranes CV:  Good peripheral perfusion.  Resp:  Normal effort.  Abd:  No distention.  Soft MSK:  No deformity noted.  Neuro:  No focal deficits appreciated. Other:  Left-sided AKA, chronically bedbound, right knee deformity chronically   ED Results /  Procedures / Treatments   Labs (all labs ordered are listed, but only abnormal results are displayed) Labs Reviewed  COMPREHENSIVE METABOLIC PANEL WITH GFR - Abnormal; Notable for the following components:      Result Value   Glucose, Bld 118 (*)    BUN 27 (*)    Creatinine, Ser 1.50 (*)    Calcium  8.8 (*)    Albumin 3.3 (*)    AST 13 (*)    GFR, Estimated 35 (*)    All other components within normal limits  CBC WITH DIFFERENTIAL/PLATELET - Abnormal; Notable for the following components:   RBC 3.63 (*)    Hemoglobin 11.8 (*)    MCV 100.3 (*)    All other components within normal limits  URINALYSIS, ROUTINE W REFLEX MICROSCOPIC - Abnormal; Notable for the following components:   Color, Urine YELLOW (*)    APPearance TURBID (*)    Glucose, UA >=500 (*)    Hgb urine dipstick SMALL (*)    Protein, ur 30 (*)    Leukocytes,Ua LARGE (*)  Bacteria, UA MANY (*)    All other components within normal limits  CULTURE, BLOOD (ROUTINE X 2)  CULTURE, BLOOD (ROUTINE X 2)  URINE CULTURE  LACTIC ACID, PLASMA  PROTIME-INR    EKG Sinus rhythm with a rate of 60 bpm.  Prolonged PR interval without high-grade block.  No clear signs of acute ischemia.  RADIOLOGY CXR interpreted by me without evidence of acute cardiopulmonary pathology.  Official radiology report(s): DG Chest Port 1 View Result Date: 10/19/2023 CLINICAL DATA:  Questionable sepsis. EXAM: PORTABLE CHEST 1 VIEW COMPARISON:  Chest radiograph dated 08/20/2023. FINDINGS: Mild cardiomegaly with mild central vascular congestion. No focal consolidation, pleural effusion or pneumothorax. Atherosclerotic calcification of the aorta. No acute osseous pathology. IMPRESSION: Mild cardiomegaly with mild central vascular congestion. No focal consolidation. Electronically Signed   By: Angus Bark M.D.   On: 10/19/2023 13:31    PROCEDURES and INTERVENTIONS:  .1-3 Lead EKG Interpretation  Performed by: Arline Bennett, MD Authorized by:  Arline Bennett, MD     Interpretation: normal     ECG rate:  61   ECG rate assessment: normal     Rhythm: sinus rhythm     Ectopy: none     Conduction: normal     Medications  cefTRIAXone  (ROCEPHIN ) 1 g in sodium chloride  0.9 % 100 mL IVPB (1 g Intravenous New Bag/Given 10/19/23 1508)  enoxaparin  (LOVENOX ) injection 40 mg (has no administration in time range)  sodium chloride  flush (NS) 0.9 % injection 3 mL (has no administration in time range)  acetaminophen  (TYLENOL ) tablet 650 mg (has no administration in time range)    Or  acetaminophen  (TYLENOL ) suppository 650 mg (has no administration in time range)  HYDROcodone -acetaminophen  (NORCO/VICODIN) 5-325 MG per tablet 1-2 tablet (has no administration in time range)  morphine (PF) 2 MG/ML injection 1 mg (has no administration in time range)  ondansetron  (ZOFRAN ) tablet 4 mg (has no administration in time range)    Or  ondansetron  (ZOFRAN ) injection 4 mg (has no administration in time range)  docusate sodium  (COLACE) capsule 100 mg (has no administration in time range)  busPIRone  (BUSPAR ) tablet 5 mg (has no administration in time range)  DULoxetine  (CYMBALTA ) DR capsule 60 mg (has no administration in time range)  gabapentin  (NEURONTIN ) capsule 300 mg (has no administration in time range)  insulin  glargine-yfgn (SEMGLEE ) injection 5 Units (has no administration in time range)  insulin  aspart (novoLOG ) injection 0-6 Units (has no administration in time range)  letrozole  (FEMARA ) tablet 2.5 mg (has no administration in time range)  simvastatin  (ZOCOR ) tablet 10 mg (has no administration in time range)  ipratropium-albuterol  (DUONEB) 0.5-2.5 (3) MG/3ML nebulizer solution 3 mL (has no administration in time range)  lactated ringers  bolus 1,000 mL (0 mLs Intravenous Stopped 10/19/23 1510)     IMPRESSION / MDM / ASSESSMENT AND PLAN / ED COURSE  I reviewed the triage vital signs and the nursing notes.  Differential diagnosis includes, but  is not limited to,   {Patient presents with symptoms of an acute illness or injury that is potentially life-threatening.  Patient presents with evidence of an acute metabolic encephalopathy due to UTI without evidence of sepsis, require medical admission.  Normal CBC, renal dysfunction near baseline.  Urine with infectious features.  No source criteria for sepsis.  Urine sent for culture and she is started on antibiotics.  Clinical Course as of 10/19/23 1520  Fri Oct 19, 2023  1459 Consult hospitalist who agrees to admit. [DS]  Clinical Course User Index [DS] Arline Bennett, MD     FINAL CLINICAL IMPRESSION(S) / ED DIAGNOSES   Final diagnoses:  Metabolic encephalopathy     Rx / DC Orders   ED Discharge Orders     None        Note:  This document was prepared using Dragon voice recognition software and may include unintentional dictation errors.   Arline Bennett, MD 10/19/23 1520
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
## 2023-10-20 ENCOUNTER — Observation Stay

## 2023-10-20 DIAGNOSIS — I5032 Chronic diastolic (congestive) heart failure: Secondary | ICD-10-CM | POA: Diagnosis present

## 2023-10-20 DIAGNOSIS — S82191A Other fracture of upper end of right tibia, initial encounter for closed fracture: Secondary | ICD-10-CM | POA: Diagnosis present

## 2023-10-20 DIAGNOSIS — E041 Nontoxic single thyroid nodule: Secondary | ICD-10-CM | POA: Diagnosis present

## 2023-10-20 DIAGNOSIS — E785 Hyperlipidemia, unspecified: Secondary | ICD-10-CM | POA: Diagnosis present

## 2023-10-20 DIAGNOSIS — G4733 Obstructive sleep apnea (adult) (pediatric): Secondary | ICD-10-CM | POA: Diagnosis present

## 2023-10-20 DIAGNOSIS — M109 Gout, unspecified: Secondary | ICD-10-CM | POA: Diagnosis present

## 2023-10-20 DIAGNOSIS — S82491A Other fracture of shaft of right fibula, initial encounter for closed fracture: Secondary | ICD-10-CM | POA: Diagnosis present

## 2023-10-20 DIAGNOSIS — C50811 Malignant neoplasm of overlapping sites of right female breast: Secondary | ICD-10-CM | POA: Diagnosis present

## 2023-10-20 DIAGNOSIS — N1832 Chronic kidney disease, stage 3b: Secondary | ICD-10-CM | POA: Diagnosis present

## 2023-10-20 DIAGNOSIS — Z66 Do not resuscitate: Secondary | ICD-10-CM | POA: Diagnosis present

## 2023-10-20 DIAGNOSIS — D631 Anemia in chronic kidney disease: Secondary | ICD-10-CM | POA: Diagnosis present

## 2023-10-20 DIAGNOSIS — I2721 Secondary pulmonary arterial hypertension: Secondary | ICD-10-CM | POA: Diagnosis present

## 2023-10-20 DIAGNOSIS — E1151 Type 2 diabetes mellitus with diabetic peripheral angiopathy without gangrene: Secondary | ICD-10-CM | POA: Diagnosis present

## 2023-10-20 DIAGNOSIS — S82201A Unspecified fracture of shaft of right tibia, initial encounter for closed fracture: Secondary | ICD-10-CM

## 2023-10-20 DIAGNOSIS — I130 Hypertensive heart and chronic kidney disease with heart failure and stage 1 through stage 4 chronic kidney disease, or unspecified chronic kidney disease: Secondary | ICD-10-CM | POA: Diagnosis present

## 2023-10-20 DIAGNOSIS — G9341 Metabolic encephalopathy: Secondary | ICD-10-CM | POA: Diagnosis present

## 2023-10-20 DIAGNOSIS — F32A Depression, unspecified: Secondary | ICD-10-CM | POA: Diagnosis present

## 2023-10-20 DIAGNOSIS — W19XXXA Unspecified fall, initial encounter: Secondary | ICD-10-CM | POA: Diagnosis present

## 2023-10-20 DIAGNOSIS — E669 Obesity, unspecified: Secondary | ICD-10-CM | POA: Diagnosis present

## 2023-10-20 DIAGNOSIS — E1122 Type 2 diabetes mellitus with diabetic chronic kidney disease: Secondary | ICD-10-CM | POA: Diagnosis present

## 2023-10-20 DIAGNOSIS — Z85828 Personal history of other malignant neoplasm of skin: Secondary | ICD-10-CM | POA: Diagnosis not present

## 2023-10-20 DIAGNOSIS — J449 Chronic obstructive pulmonary disease, unspecified: Secondary | ICD-10-CM | POA: Diagnosis present

## 2023-10-20 DIAGNOSIS — E538 Deficiency of other specified B group vitamins: Secondary | ICD-10-CM | POA: Diagnosis present

## 2023-10-20 DIAGNOSIS — E1142 Type 2 diabetes mellitus with diabetic polyneuropathy: Secondary | ICD-10-CM | POA: Diagnosis present

## 2023-10-20 DIAGNOSIS — N390 Urinary tract infection, site not specified: Secondary | ICD-10-CM | POA: Diagnosis not present

## 2023-10-20 DIAGNOSIS — B9620 Unspecified Escherichia coli [E. coli] as the cause of diseases classified elsewhere: Secondary | ICD-10-CM | POA: Diagnosis present

## 2023-10-20 LAB — CBC
HCT: 34.1 % — ABNORMAL LOW (ref 36.0–46.0)
Hemoglobin: 11.1 g/dL — ABNORMAL LOW (ref 12.0–15.0)
MCH: 32.3 pg (ref 26.0–34.0)
MCHC: 32.6 g/dL (ref 30.0–36.0)
MCV: 99.1 fL (ref 80.0–100.0)
Platelets: 253 10*3/uL (ref 150–400)
RBC: 3.44 MIL/uL — ABNORMAL LOW (ref 3.87–5.11)
RDW: 13.6 % (ref 11.5–15.5)
WBC: 10 10*3/uL (ref 4.0–10.5)
nRBC: 0 % (ref 0.0–0.2)

## 2023-10-20 LAB — GLUCOSE, CAPILLARY
Glucose-Capillary: 91 mg/dL (ref 70–99)
Glucose-Capillary: 109 mg/dL — ABNORMAL HIGH (ref 70–99)
Glucose-Capillary: 109 mg/dL — ABNORMAL HIGH (ref 70–99)
Glucose-Capillary: 114 mg/dL — ABNORMAL HIGH (ref 70–99)

## 2023-10-20 LAB — BASIC METABOLIC PANEL WITH GFR
Anion gap: 9 (ref 5–15)
BUN: 26 mg/dL — ABNORMAL HIGH (ref 8–23)
CO2: 26 mmol/L (ref 22–32)
Calcium: 8.8 mg/dL — ABNORMAL LOW (ref 8.9–10.3)
Chloride: 105 mmol/L (ref 98–111)
Creatinine, Ser: 1.38 mg/dL — ABNORMAL HIGH (ref 0.44–1.00)
GFR, Estimated: 38 mL/min — ABNORMAL LOW (ref >60)
Glucose, Bld: 92 mg/dL (ref 70–99)
Potassium: 3.5 mmol/L (ref 3.5–5.1)
Sodium: 140 mmol/L (ref 135–145)

## 2023-10-20 LAB — VITAMIN B12: Vitamin B-12: 271 pg/mL (ref 180–914)

## 2023-10-20 LAB — FOLATE: Folate: 7.7 ng/mL (ref >5.9)

## 2023-10-20 MED ORDER — VITAMIN B-12 1000 MCG PO TABS
1000.0000 ug | ORAL_TABLET | Freq: Every day | ORAL | Status: DC
  Administered 2023-10-21 – 2023-10-22 (×2): 1000 ug via ORAL
  Filled 2023-10-20 (×2): qty 1

## 2023-10-20 MED ORDER — CYANOCOBALAMIN 1000 MCG/ML IJ SOLN
1000.0000 ug | Freq: Once | INTRAMUSCULAR | Status: AC
  Administered 2023-10-20: 1000 ug via INTRAMUSCULAR
  Filled 2023-10-20: qty 1

## 2023-10-20 NOTE — Plan of Care (Signed)

## 2023-10-20 NOTE — Plan of Care (Signed)
   Problem: Nutritional: Goal: Maintenance of adequate nutrition will improve Outcome: Progressing   Problem: Skin Integrity: Goal: Risk for impaired skin integrity will decrease Outcome: Progressing

## 2023-10-20 NOTE — Progress Notes (Addendum)
 PROGRESS NOTE    Melissa Mcdonald  YNW:[id number] DOB: 11-26-1940 DOA: 10/19/2023 PCP: [facility]    Assessment & Plan:   Principal Problem:   UTI (urinary tract infection)  Assessment and Plan:  UTI: UA was positive. Urine cx is pending. Continue on IV rocephin . Hx of UTIs as per pt's daughter   Acute encephalopathy: possibly secondary to UTI vs delirium vs dementia. No hx of dementia as per pt's daughter. Continue on IV abxs.   Right leg fractures: secondary to fall at [facility]. Ortho surg consulted    HTN: continue to hold home dose of torsemide , entresto , jardiance as BP is on the low end of normal     HLD: continue on statin    Depression: severity unknown. Continue on home dose of venlafaxine , buspar  & duloxetine     Chronic diastolic CHF: appears compensated. Continue to hold home torsemide , entresto  & jardiance. Monitor I/Os    DM2: likely poorly controlled. 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 Cr is trending down from day prior. Avoid nephrotoxic meds    Peripheral neuropathy: continue on home dose of gabapentin     Hx of breast cancer: continue on home dose of letrozole     Hx of COPD: w/o acute exacerbation. Continue on bronchodilators    PVD: w/ hx of left AKA. Continue on statin. Continue w/ supportive care. Uses a wheelchair but mostly bedbound    Macrocytic anemia: likely secondary to B12 deficiency. B12 IM x 1 and will start po B12 supplement tomorrow   OSA: CPAP qhs    DVT prophylaxis: lovenox  Code Status: DNR Family Communication:  discussed pt's care w/ pt's daughter and answered her questions  Disposition Plan: likely d/c to SNF  Status is: Observation The patient remains OBS appropriate and will d/c before 2 midnights.     Level of care: Med-Surg Consultants:  Ortho surg   Procedures:  Antimicrobials:   Subjective: Pt is confused  Objective: Vitals:    10/20/23 0124 10/20/23 0136 10/20/23 0427 10/20/23 0740  BP: (!) 98/53  (!) 107/48 99/61  Pulse: 69 66 68 71  Resp:   15 15  Temp:   98.2 F (36.8 C) 97.9 F (36.6 C)  TempSrc:      SpO2:  94% 92% 100%  Weight:      Height:       No intake or output data in the 24 hours ending 10/20/23 0814 Filed Weights   10/19/23 1234  Weight: 88 kg    Examination:  General exam: appears calm but confused  Respiratory system: Clear to auscultation. Respiratory effort normal. Cardiovascular system: S1 & S2+. No rubs, gallops or clicks. Gastrointestinal system: Abdomen is nondistended, soft and nontender. Normal bowel sounds heard. Central nervous system: confused. Left AKA.  Psychiatry: Judgement and insight appears poor. Flat mood and affect     Data Reviewed: I have personally reviewed following labs and imaging studies  CBC: Recent Labs  Lab 10/19/23 1238 10/20/23 0417  WBC 9.9 10.0  NEUTROABS 6.8  --   HGB 11.8* 11.1*  HCT 36.4 34.1*  MCV 100.3* 99.1  PLT 264 253   Basic Metabolic Panel: Recent Labs  Lab 10/19/23 1238 10/20/23 0417  NA 138 140  K 3.9 3.5  CL 99 105  CO2 29 26  GLUCOSE 118* 92  BUN 27* 26*  CREATININE 1.50* 1.38*  CALCIUM  8.8* 8.8*   GFR: Estimated Creatinine Clearance: 31  mL/min (A) (by C-G formula based on SCr of 1.38 mg/dL (H)). Liver Function Tests: Recent Labs  Lab 10/19/23 1238  AST 13*  ALT 14  ALKPHOS 70  BILITOT 0.7  PROT 6.9  ALBUMIN 3.3*   No results for input(s): "LIPASE", "AMYLASE" in the last 168 hours. No results for input(s): "AMMONIA" in the last 168 hours. Coagulation Profile: Recent Labs  Lab 10/19/23 1831  INR 1.1   Cardiac Enzymes: No results for input(s): "CKTOTAL", "CKMB", "CKMBINDEX", "TROPONINI" in the last 168 hours. BNP (last 3 results) No results for input(s): "PROBNP" in the last 8760 hours. HbA1C: No results for input(s): "HGBA1C" in the last 72 hours. CBG: Recent Labs  Lab 10/19/23 1745  10/19/23 2319 10/20/23 0737  GLUCAP 130* 99 91   Lipid Profile: No results for input(s): "CHOL", "HDL", "LDLCALC", "TRIG", "CHOLHDL", "LDLDIRECT" in the last 72 hours. Thyroid  Function Tests: No results for input(s): "TSH", "T4TOTAL", "FREET4", "T3FREE", "THYROIDAB" in the last 72 hours. Anemia Panel: Recent Labs    10/20/23 0417  FOLATE 7.7   Sepsis Labs: Recent Labs  Lab 10/19/23 1238  LATICACIDVEN 0.8    Recent Results (from the past 240 hours)  Blood Culture (routine x 2)     Status: None (Preliminary result)   Collection Time: 10/19/23 12:38 PM   Specimen: BLOOD  Result Value Ref Range Status   Specimen Description BLOOD RIGHT ANTECUBITAL  Final   Special Requests   Final    BOTTLES DRAWN AEROBIC AND ANAEROBIC Blood Culture adequate volume   Culture   Final    NO GROWTH < 24 HOURS Performed at [facility], [address] [place], [place] [address]    Report Status PENDING  Incomplete  Blood Culture (routine x 2)     Status: None (Preliminary result)   Collection Time: 10/19/23  6:31 PM   Specimen: Right Antecubital; Blood  Result Value Ref Range Status   Specimen Description RIGHT ANTECUBITAL  Final   Special Requests   Final    BOTTLES DRAWN AEROBIC AND ANAEROBIC Blood Culture adequate volume   Culture   Final    NO GROWTH < 12 HOURS Performed at [facility], [address] [place], [place] [address]    Report Status PENDING  Incomplete         Radiology Studies: DG Tibia/Fibula Right Result Date: 10/19/2023 CLINICAL DATA:  Right leg pain after fall at living facility. Fall 5 days ago. EXAM: RIGHT TIBIA AND FIBULA - 2 VIEW COMPARISON:  Right femur, tibia and fibula radiographs 12/02/2013 FINDINGS: There is diffuse decreased bone mineralization. There is a new oblique fracture of the proximal tibial diaphysis with up to approximately 8 mm lateral displacement of the distal fracture component with respect to the proximal fracture  component. Within the mid fibular diaphysis closer to the mid height, there is a transverse fracture with approximately 5 mm lateral displacement of the distal fracture component. There is an additional lucent spiral fracture line within the distal tibial metadiaphysis, an acute nondisplaced fracture. There is some similar lucency within the distal fibular diaphysis and metaphysis that is favored to represent low bone mineralization, however it is difficult to entirely exclude additional acute fracture. Moderate tibiotalar joint space narrowing and subchondral sclerosis. Interval removal of prior total knee arthroplasty hardware, noting previously there was dislocation of this hardware and osteolysis of the adjacent distal femur on 12/02/2013 remote radiographs. 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  There is also osteolysis of the proximal tibia. There are likely postsurgical densities seen throughout the distal femoral-proximal tibial pseudoarthrosis. IMPRESSION: 1. Markedly decreased bone mineralization. 2. Acute oblique fracture of the proximal tibial diaphysis with up to 8 mm lateral displacement of the distal fracture component with respect to the proximal fracture component. 3. Acute transverse fracture of the mid fibular diaphysis with approximately 5 mm lateral displacement of the distal fracture component. 4. Acute nondisplaced spiral fracture of the distal tibial metadiaphysis. 5. Similar lucency within the distal fibular diaphysis and metaphysis that is favored to represent low bone mineralization, however it is difficult to entirely exclude additional acute fracture. 6. Interval removal of prior total knee arthroplasty hardware compared to 2015 comparison radiographs. 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 Electronically Signed   By: Bertina Broccoli M.D.   On: 10/19/2023 19:54   DG Chest Port 1 View Result  Date: 10/19/2023 CLINICAL DATA:  Questionable sepsis. EXAM: PORTABLE CHEST 1 VIEW COMPARISON:  Chest radiograph dated 08/20/2023. FINDINGS: Mild cardiomegaly with mild central vascular congestion. No focal consolidation, pleural effusion or pneumothorax. Atherosclerotic calcification of the aorta. No acute osseous pathology. IMPRESSION: Mild cardiomegaly with mild central vascular congestion. No focal consolidation. Electronically Signed   By: Angus Bark M.D.   On: 10/19/2023 13:31        Scheduled Meds:  busPIRone   5 mg Oral TID   docusate sodium   100 mg Oral BID   DULoxetine   60 mg Oral Daily   enoxaparin  (LOVENOX ) injection  40 mg Subcutaneous Q24H   gabapentin   300 mg Oral TID   insulin  aspart  0-6 Units Subcutaneous TID WC   insulin  glargine-yfgn  5 Units Subcutaneous Daily   letrozole   2.5 mg Oral Daily   nystatin    Topical BID   simvastatin   10 mg Oral q1800   sodium chloride  flush  3 mL Intravenous Q12H   umeclidinium bromide   1 puff Inhalation Daily   venlafaxine  XR  150 mg Oral Q breakfast   Continuous Infusions:  cefTRIAXone  (ROCEPHIN )  IV       LOS: 0 days      Alphonsus Jeans, MD [facility] Pager [phone]  If 7PM-7AM, please contact night-coverage [email or web address]  10/20/2023, 8:14 AM
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
## 2023-10-20 NOTE — Care Management Obs Status (Signed)
 MEDICARE OBSERVATION STATUS NOTIFICATION   Patient Details  Name: Melissa Mcdonald MRN: [id number] Date of Birth: 1940-12-12   Medicare Observation Status Notification Given:  Yes    [place] L Jame Morrell, LCSW 10/20/2023, 10:11 AM

## 2023-10-21 DIAGNOSIS — N390 Urinary tract infection, site not specified: Secondary | ICD-10-CM | POA: Diagnosis not present

## 2023-10-21 LAB — GLUCOSE, CAPILLARY
Glucose-Capillary: 109 mg/dL — ABNORMAL HIGH (ref 70–99)
Glucose-Capillary: 144 mg/dL — ABNORMAL HIGH (ref 70–99)
Glucose-Capillary: 131 mg/dL — ABNORMAL HIGH (ref 70–99)
Glucose-Capillary: 125 mg/dL — ABNORMAL HIGH (ref 70–99)

## 2023-10-21 LAB — BASIC METABOLIC PANEL WITH GFR
Anion gap: 9 (ref 5–15)
BUN: 19 mg/dL (ref 8–23)
CO2: 26 mmol/L (ref 22–32)
Calcium: 8.7 mg/dL — ABNORMAL LOW (ref 8.9–10.3)
Chloride: 101 mmol/L (ref 98–111)
Creatinine, Ser: 1.18 mg/dL — ABNORMAL HIGH (ref 0.44–1.00)
GFR, Estimated: 46 mL/min — ABNORMAL LOW (ref >60)
Glucose, Bld: 110 mg/dL — ABNORMAL HIGH (ref 70–99)
Potassium: 3.6 mmol/L (ref 3.5–5.1)
Sodium: 136 mmol/L (ref 135–145)

## 2023-10-21 LAB — URINE CULTURE: Culture: >=100000 — AB

## 2023-10-21 LAB — CBC
HCT: 35.1 % — ABNORMAL LOW (ref 36.0–46.0)
Hemoglobin: 11.5 g/dL — ABNORMAL LOW (ref 12.0–15.0)
MCH: 32.8 pg (ref 26.0–34.0)
MCHC: 32.8 g/dL (ref 30.0–36.0)
MCV: 100 fL (ref 80.0–100.0)
Platelets: 258 10*3/uL (ref 150–400)
RBC: 3.51 MIL/uL — ABNORMAL LOW (ref 3.87–5.11)
RDW: 13.8 % (ref 11.5–15.5)
WBC: 9.2 10*3/uL (ref 4.0–10.5)
nRBC: 0 % (ref 0.0–0.2)

## 2023-10-21 MED ORDER — SODIUM CHLORIDE 0.9 % IV BOLUS
1000.0000 mL | Freq: Once | INTRAVENOUS | Status: AC
  Administered 2023-10-21: 1000 mL via INTRAVENOUS

## 2023-10-21 MED ORDER — CIPROFLOXACIN HCL 500 MG PO TABS
500.0000 mg | ORAL_TABLET | Freq: Two times a day (BID) | ORAL | Status: DC
  Administered 2023-10-21 – 2023-10-22 (×3): 500 mg via ORAL
  Filled 2023-10-21 (×4): qty 1

## 2023-10-21 NOTE — Plan of Care (Signed)

## 2023-10-21 NOTE — Plan of Care (Signed)

## 2023-10-21 NOTE — Progress Notes (Addendum)
 PROGRESS NOTE    [name]  WUX:[id number] DOB: 12/19/1940 DOA: 10/19/2023 PCP: [facility]    Assessment & Plan:   Principal Problem:   UTI (urinary tract infection) Active Problems:   Closed fracture of shaft of right tibia  Assessment and Plan:  UTI: UA was positive. Urine cx growing e. coli. D/c IV rocephin  & start cipro as per cx results. Hx of UTIs as per pt's daughter   Acute encephalopathy: possibly secondary to UTI vs delirium vs dementia. No hx of dementia as per pt's daughter. Continue on abxs   Right leg fractures: secondary to fall at [facility]. No surg as per ortho surg. Continue w/ splint & ortho surg will order custom fracture brace    HTN: continue to hold home dose of torsemide , entresto , jardiance as BP is on the low end of normal     HLD: continue on statin     Depression: severity unknown. Continue on duloxetine , venlafaxine  & buspar     Chronic diastolic CHF: appears compensated. Continue to home dose of entresto , torsemide , & jardiance. Monitor I/Os   DM2: likely poorly controlled. 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 Cr is trending down again today. Avoid nephrotoxic meds    Peripheral neuropathy: continue on home dose of gabapentin     Hx of breast cancer: continue on home dose of letrozole     Hx of COPD: w/o acute exacerbation. Continue on bronchodilators    PVD: w/ hx of left AKA. Continue on statin. Continue w/ supportive care. Uses a wheelchair but mostly bedbound    Macrocytic anemia: likely secondary to B12 deficiency. S/p B12 IM x 1. Continue on B12 supplement   OSA: CPAP qhs    DVT prophylaxis: lovenox  Code Status: DNR Family Communication:  discussed pt's care w/ pt's daughter, Felipa Horsfall, and answered her questions  Disposition Plan: likely d/c to SNF  Status is: Inpatient Remains inpatient appropriate because: needs SNF placement        Level  of care: Med-Surg Consultants:  Ortho surg   Procedures:  Antimicrobials: cipro    Subjective: Pt is pleasantly confused   Objective: Vitals:   10/20/23 0740 10/20/23 1515 10/20/23 2059 10/21/23 0742  BP: 99/61 106/66 108/64 (!) 83/44  Pulse: 71 87 90 76  Resp: 15 16 18 17   Temp: 97.9 F (36.6 C) 97.6 F (36.4 C) 98.6 F (37 C) 98.2 F (36.8 C)  TempSrc:  Oral Oral   SpO2: 100% 97% 97% 95%  Weight:      Height:        Intake/Output Summary (Last 24 hours) at 10/21/2023 0802 Last data filed at 10/20/2023 2156 Gross per 24 hour  Intake 540 ml  Output --  Net 540 ml   Filed Weights   10/19/23 1234  Weight: 88 kg    Examination:  General exam: appears comfortable Respiratory system: clear breath sounds b/l  Cardiovascular system: S1/S2+. No rubs or clicks  Gastrointestinal system: Abd is soft, NT, obese & hypoactive bowel sounds  Central nervous system: confused. Left AKA. RLE is in a splint  Psychiatry: Judgement and insight appears poor.     Data Reviewed: I have personally reviewed following labs and imaging studies  CBC: Recent Labs  Lab 10/19/23 1238 10/20/23 0417 10/21/23 0451  WBC 9.9 10.0 9.2  NEUTROABS 6.8  --   --   HGB 11.8* 11.1* 11.5*  HCT 36.4 34.1* 35.1*  MCV  100.3* 99.1 100.0  PLT 264 253 258   Basic Metabolic Panel: Recent Labs  Lab 10/19/23 1238 10/20/23 0417 10/21/23 0451  NA 138 140 136  K 3.9 3.5 3.6  CL 99 105 101  CO2 29 26 26   GLUCOSE 118* 92 110*  BUN 27* 26* 19  CREATININE 1.50* 1.38* 1.18*  CALCIUM  8.8* 8.8* 8.7*   GFR: Estimated Creatinine Clearance: 36.3 mL/min (A) (by C-G formula based on SCr of 1.18 mg/dL (H)). Liver Function Tests: Recent Labs  Lab 10/19/23 1238  AST 13*  ALT 14  ALKPHOS 70  BILITOT 0.7  PROT 6.9  ALBUMIN 3.3*   No results for input(s): "LIPASE", "AMYLASE" in the last 168 hours. No results for input(s): "AMMONIA" in the last 168 hours. Coagulation Profile: Recent Labs  Lab  10/19/23 1831  INR 1.1   Cardiac Enzymes: No results for input(s): "CKTOTAL", "CKMB", "CKMBINDEX", "TROPONINI" in the last 168 hours. BNP (last 3 results) No results for input(s): "PROBNP" in the last 8760 hours. HbA1C: No results for input(s): "HGBA1C" in the last 72 hours. CBG: Recent Labs  Lab 10/20/23 0737 10/20/23 1147 10/20/23 1706 10/20/23 2144 10/21/23 0740  GLUCAP 91 109* 109* 114* 109*   Lipid Profile: No results for input(s): "CHOL", "HDL", "LDLCALC", "TRIG", "CHOLHDL", "LDLDIRECT" in the last 72 hours. Thyroid  Function Tests: No results for input(s): "TSH", "T4TOTAL", "FREET4", "T3FREE", "THYROIDAB" in the last 72 hours. Anemia Panel: Recent Labs    10/20/23 0417  VITAMINB12 271  FOLATE 7.7   Sepsis Labs: Recent Labs  Lab 10/19/23 1238  LATICACIDVEN 0.8    Recent Results (from the past 240 hours)  Blood Culture (routine x 2)     Status: None (Preliminary result)   Collection Time: 10/19/23 12:38 PM   Specimen: BLOOD  Result Value Ref Range Status   Specimen Description BLOOD RIGHT ANTECUBITAL  Final   Special Requests   Final    BOTTLES DRAWN AEROBIC AND ANAEROBIC Blood Culture adequate volume   Culture   Final    NO GROWTH 2 DAYS Performed at [facility], [address], [place], [place] [address]    Report Status PENDING  Incomplete  Urine Culture     Status: Abnormal (Preliminary result)   Collection Time: 10/19/23 12:38 PM   Specimen: Urine, Clean Catch  Result Value Ref Range Status   Specimen Description   Final    URINE, CLEAN CATCH Performed at [facility], [address], [place], [place] [address]    Special Requests   Final    NONE Performed at [facility], [address] [place], [place] [address]    Culture (A)  Final    >=100,000 COLONIES/mL ESCHERICHIA COLI SUSCEPTIBILITIES TO FOLLOW Performed at [facility], [address] [place], [place] [address]    Report Status PENDING   Incomplete  Blood Culture (routine x 2)     Status: None (Preliminary result)   Collection Time: 10/19/23  6:31 PM   Specimen: Right Antecubital; Blood  Result Value Ref Range Status   Specimen Description RIGHT ANTECUBITAL  Final   Special Requests   Final    BOTTLES DRAWN AEROBIC AND ANAEROBIC Blood Culture adequate volume   Culture   Final    NO GROWTH 2 DAYS Performed at [facility], [address], [place], [place] [address]    Report Status PENDING  Incomplete         Radiology Studies: DG Tibia/Fibula Right Port  Result Date: 10/20/2023 CLINICAL DATA:  [id number] Closed fracture of shaft of fibula with tibia, right, initial encounter [id number] Status post reduction. EXAM: PORTABLE RIGHT TIBIA AND FIBULA - 2 VIEW COMPARISON:  Tibia/fibular radiographs yesterday FINDINGS: Segmental tibial fracture, grossly stable alignment allowing for differences in technique. Fibular shaft fracture without significant interval change in alignment. The bones are diffusely under mineralized. Unchanged knee deformity. Generalized soft tissue edema. Splint material posteriorly. IMPRESSION: 1. Segmental tibial and fibular fractures, grossly stable alignment allowing for differences in technique. 2. Osteoporosis. 3. Unchanged knee deformity. Electronically Signed   By: Chadwick Colonel M.D.   On: 10/20/2023 13:03   DG Tibia/Fibula Right Result Date: 10/19/2023 CLINICAL DATA:  Right leg pain after fall at living facility. Fall 5 days ago. EXAM: RIGHT TIBIA AND FIBULA - 2 VIEW COMPARISON:  Right femur, tibia and fibula radiographs 12/02/2013 FINDINGS: There is diffuse decreased bone mineralization. There is a new oblique fracture of the proximal tibial diaphysis with up to approximately 8 mm lateral displacement of the distal fracture component with respect to the proximal fracture component. Within the mid fibular diaphysis closer to the mid height, there is a transverse fracture with approximately 5 mm  lateral displacement of the distal fracture component. There is an additional lucent spiral fracture line within the distal tibial metadiaphysis, an acute nondisplaced fracture. There is some similar lucency within the distal fibular diaphysis and metaphysis that is favored to represent low bone mineralization, however it is difficult to entirely exclude additional acute fracture. Moderate tibiotalar joint space narrowing and subchondral sclerosis. Interval removal of prior total knee arthroplasty hardware, noting previously there was dislocation of this hardware and osteolysis of the adjacent distal femur on 12/02/2013 remote radiographs. 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 There is also osteolysis of the proximal tibia. There are likely postsurgical densities seen throughout the distal femoral-proximal tibial pseudoarthrosis. IMPRESSION: 1. Markedly decreased bone mineralization. 2. Acute oblique fracture of the proximal tibial diaphysis with up to 8 mm lateral displacement of the distal fracture component with respect to the proximal fracture component. 3. Acute transverse fracture of the mid fibular diaphysis with approximately 5 mm lateral displacement of the distal fracture component. 4. Acute nondisplaced spiral fracture of the distal tibial metadiaphysis. 5. Similar lucency within the distal fibular diaphysis and metaphysis that is favored to represent low bone mineralization, however it is difficult to entirely exclude additional acute fracture. 6. Interval removal of prior total knee arthroplasty hardware compared to 2015 comparison radiographs. 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 Electronically Signed   By: Bertina Broccoli M.D.   On: 10/19/2023 19:54   DG Chest Port 1 View Result Date: 10/19/2023 CLINICAL DATA:  Questionable sepsis. EXAM: PORTABLE CHEST 1 VIEW COMPARISON:  Chest radiograph dated  08/20/2023. FINDINGS: Mild cardiomegaly with mild central vascular congestion. No focal consolidation, pleural effusion or pneumothorax. Atherosclerotic calcification of the aorta. No acute osseous pathology. IMPRESSION: Mild cardiomegaly with mild central vascular congestion. No focal consolidation. Electronically Signed   By: Angus Bark M.D.   On: 10/19/2023 13:31        Scheduled Meds:  busPIRone   5 mg Oral TID   vitamin B-12  1,000 mcg Oral Daily   docusate sodium   100 mg Oral BID   DULoxetine   60 mg Oral Daily   enoxaparin  (LOVENOX ) injection  40 mg Subcutaneous Q24H   gabapentin   300 mg Oral TID   insulin   aspart  0-6 Units Subcutaneous TID WC   insulin  glargine-yfgn  5 Units Subcutaneous Daily   letrozole   2.5 mg Oral Daily   nystatin    Topical BID   simvastatin   10 mg Oral q1800   sodium chloride  flush  3 mL Intravenous Q12H   umeclidinium bromide   1 puff Inhalation Daily   venlafaxine  XR  150 mg Oral Q breakfast   Continuous Infusions:  cefTRIAXone  (ROCEPHIN )  IV 1 g (10/20/23 1227)   sodium chloride        LOS: 1 day      Alphonsus Jeans, MD [facility] Pager [phone]  If 7PM-7AM, please contact night-coverage [email or web address]  10/21/2023, 8:02 AM

## 2023-10-21 NOTE — Progress Notes (Signed)
 Respiratory was called to help patient with CPAP.

## 2023-10-22 DIAGNOSIS — N390 Urinary tract infection, site not specified: Secondary | ICD-10-CM | POA: Diagnosis not present

## 2023-10-22 LAB — BASIC METABOLIC PANEL WITH GFR
Anion gap: 6 (ref 5–15)
BUN: 18 mg/dL (ref 8–23)
CO2: 25 mmol/L (ref 22–32)
Calcium: 8.5 mg/dL — ABNORMAL LOW (ref 8.9–10.3)
Chloride: 107 mmol/L (ref 98–111)
Creatinine, Ser: 0.93 mg/dL (ref 0.44–1.00)
GFR, Estimated: >60 mL/min (ref >60)
Glucose, Bld: 106 mg/dL — ABNORMAL HIGH (ref 70–99)
Potassium: 3.7 mmol/L (ref 3.5–5.1)
Sodium: 138 mmol/L (ref 135–145)

## 2023-10-22 LAB — CBC
HCT: 32.5 % — ABNORMAL LOW (ref 36.0–46.0)
Hemoglobin: 10.6 g/dL — ABNORMAL LOW (ref 12.0–15.0)
MCH: 32.7 pg (ref 26.0–34.0)
MCHC: 32.6 g/dL (ref 30.0–36.0)
MCV: 100.3 fL — ABNORMAL HIGH (ref 80.0–100.0)
Platelets: 247 10*3/uL (ref 150–400)
RBC: 3.24 MIL/uL — ABNORMAL LOW (ref 3.87–5.11)
RDW: 13.5 % (ref 11.5–15.5)
WBC: 9.3 10*3/uL (ref 4.0–10.5)
nRBC: 0 % (ref 0.0–0.2)

## 2023-10-22 LAB — GLUCOSE, CAPILLARY
Glucose-Capillary: 87 mg/dL (ref 70–99)
Glucose-Capillary: 101 mg/dL — ABNORMAL HIGH (ref 70–99)

## 2023-10-22 MED ORDER — HYDROCODONE-ACETAMINOPHEN 7.5-325 MG PO TABS: 1.0000 | ORAL_TABLET | ORAL | 0 refills | Status: AC | PRN

## 2023-10-22 MED ORDER — CYANOCOBALAMIN 1000 MCG PO TABS: 1000.0000 ug | ORAL_TABLET | Freq: Every day | ORAL | 0 refills | Status: AC

## 2023-10-22 MED ORDER — ALPRAZOLAM 0.25 MG PO TABS: 0.2500 mg | ORAL_TABLET | Freq: Every evening | ORAL | 0 refills | Status: DC | PRN

## 2023-10-22 MED ORDER — ENOXAPARIN SODIUM 60 MG/0.6ML IJ SOSY: 0.5000 mg/kg | PREFILLED_SYRINGE | INTRAMUSCULAR | Status: DC

## 2023-10-22 MED ORDER — CIPROFLOXACIN HCL 500 MG PO TABS: 500.0000 mg | ORAL_TABLET | Freq: Two times a day (BID) | ORAL | 0 refills | Status: AC

## 2023-10-22 NOTE — Plan of Care (Signed)
  Problem: Coping: Goal: Ability to adjust to condition or change in health will improve Outcome: Progressing   Problem: Clinical Measurements: Goal: Ability to maintain clinical measurements within normal limits will improve Outcome: Progressing Goal: Will remain free from infection Outcome: Progressing Goal: Diagnostic test results will improve Outcome: Progressing

## 2023-10-22 NOTE — Progress Notes (Signed)
 PHARMACIST - PHYSICIAN COMMUNICATION  CONCERNING:  Enoxaparin  (Lovenox ) for DVT Prophylaxis   ASSESSMENT: Patient was prescribed enoxaparin  40 mg subcutaneously every 24 hours for VTE prophylaxis.   Body mass index is 37.89 kg/m.  Estimated Creatinine Clearance: 46 mL/min (by C-G formula based on SCr of 0.93 mg/dL).  Based on [facility] policy, patient qualifies for enoxaparin  dosing of 0.5 mg per kilogram of total body weight every 24 hours because their body mass index is >30 kg/m2.  PLAN: Pharmacy has adjusted enoxaparin  dose per [facility] policy.  Description: Patient is now receiving enoxaparin  0.5 mg/kg subcutaneously every 24 hours.  Will M. Alva Jewels, PharmD Clinical Pharmacist 10/22/2023 8:48 AM

## 2023-10-22 NOTE — NC FL2 (Signed)
 [place]  MEDICAID FL2 LEVEL OF CARE FORM     IDENTIFICATION  Patient Name: Melissa Mcdonald Birthdate: [birth year] Sex: female Admission Date (Current Location): 10/19/2023  [facility] and [place] Number:  Chiropodist and Address:  [facility], [address] [place], [place] [address]      Provider Number: [id number]  Attending Physician Name and Address:  Alphonsus Jeans, MD  Relative Name and Phone Number:  Daughter, Hildred Lowenstein, [phone]    Current Level of Care: Hospital Recommended Level of Care: Nursing Facility Prior Approval Number:    Date Approved/Denied:   PASRR Number: [id number]  Discharge Plan: SNF    Current Diagnoses: Patient Active Problem List   Diagnosis Date Noted   Closed fracture of shaft of right tibia 10/20/2023   UTI (urinary tract infection) 10/19/2023   Acute respiratory failure with hypoxia (HCC) 08/20/2023   COPD (chronic obstructive pulmonary disease) (HCC) 08/20/2023   Encephalopathy 08/20/2023   Type 2 diabetes mellitus (HCC) 08/20/2023   Hypertension 08/20/2023   History of breast cancer 08/20/2023   CKD (chronic kidney disease), stage III (HCC) 08/20/2023   Lumbar foraminal stenosis 08/20/2023   AKI (acute kidney injury) (HCC) 05/12/2023   Lumbar foraminal stenosis 05/11/2023   Chronic obstructive pulmonary disease (COPD) (HCC) 05/11/2023   Chronic respiratory failure with hypoxia (HCC) 05/11/2023   Diabetes mellitus without complication (HCC) 05/11/2023   Acute on chronic combined systolic and diastolic CHF (congestive heart failure) (HCC) 05/11/2023   History of breast cancer 05/11/2023   S/P AKA (above knee amputation) (HCC) 05/11/2023   Intractable pain 05/11/2023   Diverticulosis 07/16/2019   Neuropathy 07/16/2019   Shortness of breath 07/26/2018   Localized edema 07/26/2018   Palliative care encounter 07/26/2018   Goals of care, counseling/discussion 08/21/2017   Acute  respiratory failure with hypoxia (HCC) 07/30/2017   Sepsis (HCC) 07/30/2017   Influenza A 07/30/2017   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   Elevated uric acid in blood 07/28/2017   Hyperuricemia 07/17/2017   Abnormal CT of the head 06/08/2017   RUQ fullness 12/15/2016   Atypical chest pain 01/17/2016   Thyroid  nodule 11/12/2015   Abnormal PET scan of mediastinum 11/03/2015   Parotid nodule 11/03/2015   Hypocalcemia 09/19/2015   Post-operative state 12/19/2013   Iron deficiency anemia 12/05/2013   Diverticulosis of colon 11/25/2013   History of total knee replacement 11/25/2013   Hypertension 11/25/2013   Migraine, unspecified, not intractable, without status migrainosus 11/25/2013   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/25/2013   Mononeuritis 11/25/2013   Other chronic pulmonary heart diseases 11/25/2013   Osteoarthritis 10/06/2013   Breast cancer (HCC) 01/08/2013   Above knee amputation status 09/23/2012   Sleep apnea 07/29/2012   Anemia 04/30/2012   Infection or inflammatory reaction due to internal joint prosthesis (HCC) 03/13/2012   Infection following a procedure, unspecified, initial encounter 12/14/2011   Lymphedema 11/01/2011   Pulmonary arterial hypertension (HCC) 2009    Orientation RESPIRATION BLADDER Height & Weight     Self, Time  Normal Incontinent Weight: 88 kg Height:  5' (152.4 cm)  BEHAVIORAL SYMPTOMS/MOOD NEUROLOGICAL BOWEL NUTRITION STATUS      Incontinent Diet (Carb-Modified, thin liquid)  AMBULATORY STATUS COMMUNICATION OF NEEDS Skin   Limited Assist Verbally Normal                       Personal Care Assistance Level of Assistance  Bathing, Feeding, Dressing Bathing Assistance:  Limited assistance Feeding assistance: Limited assistance Dressing Assistance: Limited assistance     Functional Limitations Info             SPECIAL CARE FACTORS FREQUENCY                       Contractures Contractures Info: Not  present    Additional Factors Info  Code Status Code Status Info: DNR-Limited             Current Medications (10/22/2023):  This is the current hospital active medication list Current Facility-Administered Medications  Medication Dose Route Frequency Provider Last Rate Last Admin   acetaminophen  (TYLENOL ) tablet 650 mg  650 mg Oral Q6H PRN Alphonsus Jeans, MD   650 mg at 10/21/23 2250   Or   acetaminophen  (TYLENOL ) suppository 650 mg  650 mg Rectal Q6H PRN Alphonsus Jeans, MD       ALPRAZolam  (XANAX ) tablet 0.25 mg  0.25 mg Oral QHS PRN Alphonsus Jeans, MD   0.25 mg at 10/21/23 2251   busPIRone  (BUSPAR ) tablet 5 mg  5 mg Oral TID Alphonsus Jeans, MD   5 mg at 10/22/23 0836   ciprofloxacin  (CIPRO ) tablet 500 mg  500 mg Oral BID Alphonsus Jeans, MD   500 mg at 10/22/23 7829   cyanocobalamin  (VITAMIN B12) tablet 1,000 mcg  1,000 mcg Oral Daily Alphonsus Jeans, MD   1,000 mcg at 10/22/23 0836   docusate sodium  (COLACE) capsule 100 mg  100 mg Oral BID Alphonsus Jeans, MD   100 mg at 10/22/23 5621   DULoxetine  (CYMBALTA ) DR capsule 60 mg  60 mg Oral Daily Alphonsus Jeans, MD   60 mg at 10/22/23 3086   enoxaparin  (LOVENOX ) injection 45 mg  0.5 mg/kg Subcutaneous Q24H Madelynn Schilder, [facility]       gabapentin  (NEURONTIN ) capsule 300 mg  300 mg Oral TID Alphonsus Jeans, MD   300 mg at 10/22/23 5784   HYDROcodone -acetaminophen  (NORCO/VICODIN) 5-325 MG per tablet 1-2 tablet  1-2 tablet Oral Q4H PRN Alphonsus Jeans, MD   1 tablet at 10/22/23 6962   insulin  aspart (novoLOG ) injection 0-6 Units  0-6 Units Subcutaneous TID WC Alphonsus Jeans, MD       insulin  glargine-yfgn (SEMGLEE ) injection 5 Units  5 Units Subcutaneous Daily Alphonsus Jeans, MD   5 Units at 10/22/23 0836   ipratropium-albuterol  (DUONEB) 0.5-2.5 (3) MG/3ML nebulizer solution 3 mL  3 mL Nebulization Q6H PRN Alphonsus Jeans, MD       letrozole  (FEMARA ) tablet 2.5 mg  2.5 mg Oral Daily  Alphonsus Jeans, MD   2.5 mg at 10/22/23 0836   morphine  (PF) 2 MG/ML injection 1 mg  1 mg Intravenous Q4H PRN Williams, Jamiese M, MD   1 mg at 10/20/23 1803   nystatin  (MYCOSTATIN /NYSTOP ) topical powder   Topical BID Alphonsus Jeans, MD   Given at 10/22/23 9528   ondansetron  (ZOFRAN ) tablet 4 mg  4 mg Oral Q6H PRN Alphonsus Jeans, MD       Or   ondansetron  (ZOFRAN ) injection 4 mg  4 mg Intravenous Q6H PRN Alphonsus Jeans, MD       simvastatin  (ZOCOR ) tablet 10 mg  10 mg Oral q1800 Alphonsus Jeans, MD   10 mg at 10/21/23 1721   sodium chloride  flush (NS) 0.9 % injection 3 mL  3 mL Intravenous Q12H Alphonsus Jeans, MD  3 mL at 10/22/23 0836   umeclidinium bromide  (INCRUSE ELLIPTA ) 62.5 MCG/ACT 1 puff  1 puff Inhalation Daily Alphonsus Jeans, MD   1 puff at 10/19/23 2228   venlafaxine  XR (EFFEXOR -XR) 24 hr capsule 150 mg  150 mg Oral Q breakfast Williams, Jamiese M, MD   150 mg at 10/22/23 2130     Discharge Medications: Please see discharge summary for a list of discharge medications.  Relevant Imaging Results:  Relevant Lab Results:   Additional Information SSN [id number]  Alexandra Ice, RN

## 2023-10-22 NOTE — TOC Transition Note (Signed)
 Transition of Care [facility]) - Discharge Note   Patient Details  Name: Melissa Mcdonald MRN: [id number] Date of Birth: 08-15-1940  [facility] CM/SW Contact:  Alexandra Ice, RN Phone Number: 10/22/2023, 3:38 PM   Clinical Narrative:    Patient to discharge today, return to [facility]. Sent FL2, discharge summary and orders sent to facility via HUB. Sent message to facility notifying them that patient has ESBL in urine, awaiting response. Patient returning to her room, [address]B, call report to [phone]. Provided information bedside nurse. EMS packet printed to nurse station.    Final next level of care: Skilled Nursing Facility Barriers to Discharge: Barriers Resolved   Patient Goals and CMS Choice            Discharge Placement              Patient chooses bed at: [facility] Patient to be transferred to facility by: [facility] Name of family member notified: Hildred Lowenstein Patient and family notified of of transfer: 10/22/23  Discharge Plan and Services Additional resources added to the After Visit Summary for                    DME Agency: NA       HH Arranged: NA          Social Drivers of Health (SDOH) Interventions SDOH Screenings   Food Insecurity: Patient Unable To Answer (10/20/2023)  Housing: Patient Unable To Answer (10/20/2023)  Transportation Needs: Patient Unable To Answer (10/20/2023)  Utilities: Patient Unable To Answer (10/20/2023)  Financial Resource Strain: Low Risk  (07/30/2017)  Physical Activity: Unknown (07/30/2017)  Social Connections: Unknown (10/20/2023)  Recent Concern: Social Connections - Socially Isolated (08/20/2023)  Stress: No Stress Concern Present (07/30/2017)  Tobacco Use: Low Risk  (10/19/2023)     Readmission Risk Interventions     No data to display

## 2023-10-22 NOTE — Progress Notes (Signed)
 PROGRESS NOTE    Melissa Mcdonald  GNF:[id number] DOB: [birth year] DOA: 10/19/2023 PCP: [facility]    Assessment & Plan:   Principal Problem:   UTI (urinary tract infection) Active Problems:   Closed fracture of shaft of right tibia  Assessment and Plan:  UTI: UA was positive. Urine cx growing e. coli. Continue on cipro . Hx of UTIs as per pt's daughter   Acute encephalopathy: possibly secondary to UTI vs delirium vs dementia. No hx of dementia as per pt's daughter. Continue on abxs   Right leg fractures: secondary to fall at [facility]. No surg as per ortho surg. Continue w/ splint as per ortho surg. Will need to f/u outpatient w/ ortho surg in 1 week to get measured for custom brace as per ortho surg    HTN: continue to hold entresto , jardiance, torsemide  as BP is on the low end of normal    HLD: continue on statin    Depression: severity unknown. Continue on buspar , duloxetine , venlafaxine     Chronic diastolic CHF: appears compensated. Monitor I/Os. Continue to hold home dose of entresto , torsemide , & jardiance.    DM2: likely poorly controlled. 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 Cr is trending down daily    Peripheral neuropathy: continue on home dose of gabapentin     Hx of breast cancer: continue on home dose of letrozole     Hx of COPD: w/o acute exacerbation. Continue on bronchodilators  PVD: w/ hx of left AKA. Continue on statin. Continue w/ supportive care. Uses a wheelchair but mostly bedbound    Macrocytic anemia: likely secondary to B12 deficiency. S/p B12 IM x 1. Continue w/ B12 supplements   OSA: CPAP qhs    DVT prophylaxis: lovenox  Code Status: DNR Family Communication:  discussed pt's care w/ pt's daughter, Felipa Horsfall, and answered her questions  Disposition Plan: likely d/c to SNF  Status is: Inpatient Remains inpatient appropriate because: needs SNF placement         Level of care: Med-Surg Consultants:  Ortho surg   Procedures:  Antimicrobials: cipro     Subjective: Pt c/o leg pain   Objective: Vitals:   10/21/23 2023 10/22/23 0429 10/22/23 0738 10/22/23 0738  BP: (!) 106/56 (!) 104/54 (!) 118/53 (!) 118/53  Pulse: 84 73 73 74  Resp: 18 18 16 16   Temp: 98.1 F (36.7 C) (!) 97.4 F (36.3 C) 98.8 F (37.1 C) 98.8 F (37.1 C)  TempSrc:  Oral    SpO2: 97% 98% 96% 98%  Weight:      Height:        Intake/Output Summary (Last 24 hours) at 10/22/2023 8469 Last data filed at 10/22/2023 0500 Gross per 24 hour  Intake 400 ml  Output 500 ml  Net -100 ml   Filed Weights   10/19/23 1234  Weight: 88 kg    Examination:  General exam: appears calm & comfortable  Respiratory system: clear breath sounds b/l  Cardiovascular system: S1 & S2+. No rubs or clicks  Gastrointestinal system: Abd is soft, NT, obese & hypoactive bowel sounds  Central nervous system: alert & awake.  Psychiatry: judgement and insight appears poor. Flat mood and affect     Data Reviewed: I have personally reviewed following labs and imaging studies  CBC: Recent Labs  Lab 10/19/23 1238 10/20/23 0417 10/21/23 0451 10/22/23 0240  WBC 9.9 10.0 9.2 9.3  NEUTROABS 6.8  --   --   --  HGB 11.8* 11.1* 11.5* 10.6*  HCT 36.4 34.1* 35.1* 32.5*  MCV 100.3* 99.1 100.0 100.3*  PLT 264 253 258 247   Basic Metabolic Panel: Recent Labs  Lab 10/19/23 1238 10/20/23 0417 10/21/23 0451 10/22/23 0240  NA 138 140 136 138  K 3.9 3.5 3.6 3.7  CL 99 105 101 107  CO2 29 26 26 25   GLUCOSE 118* 92 110* 106*  BUN 27* 26* 19 18  CREATININE 1.50* 1.38* 1.18* 0.93  CALCIUM  8.8* 8.8* 8.7* 8.5*   GFR: Estimated Creatinine Clearance: 46 mL/min (by C-G formula based on SCr of 0.93 mg/dL). Liver Function Tests: Recent Labs  Lab 10/19/23 1238  AST 13*  ALT 14  ALKPHOS 70  BILITOT 0.7  PROT 6.9  ALBUMIN 3.3*   No results for input(s): "LIPASE", "AMYLASE" in  the last 168 hours. No results for input(s): "AMMONIA" in the last 168 hours. Coagulation Profile: Recent Labs  Lab 10/19/23 1831  INR 1.1   Cardiac Enzymes: No results for input(s): "CKTOTAL", "CKMB", "CKMBINDEX", "TROPONINI" in the last 168 hours. BNP (last 3 results) No results for input(s): "PROBNP" in the last 8760 hours. HbA1C: No results for input(s): "HGBA1C" in the last 72 hours. CBG: Recent Labs  Lab 10/21/23 0740 10/21/23 1153 10/21/23 1652 10/21/23 2255 10/22/23 0740  GLUCAP 109* 144* 131* 125* 87   Lipid Profile: No results for input(s): "CHOL", "HDL", "LDLCALC", "TRIG", "CHOLHDL", "LDLDIRECT" in the last 72 hours. Thyroid  Function Tests: No results for input(s): "TSH", "T4TOTAL", "FREET4", "T3FREE", "THYROIDAB" in the last 72 hours. Anemia Panel: Recent Labs    10/20/23 0417  VITAMINB12 271  FOLATE 7.7   Sepsis Labs: Recent Labs  Lab 10/19/23 1238  LATICACIDVEN 0.8    Recent Results (from the past 240 hours)  Blood Culture (routine x 2)     Status: None (Preliminary result)   Collection Time: 10/19/23 12:38 PM   Specimen: BLOOD  Result Value Ref Range Status   Specimen Description BLOOD RIGHT ANTECUBITAL  Final   Special Requests   Final    BOTTLES DRAWN AEROBIC AND ANAEROBIC Blood Culture adequate volume   Culture   Final    NO GROWTH 3 DAYS Performed at [facility], [address], [place], [place] [address]    Report Status PENDING  Incomplete  Urine Culture     Status: Abnormal   Collection Time: 10/19/23 12:38 PM   Specimen: Urine, Clean Catch  Result Value Ref Range Status   Specimen Description   Final    URINE, CLEAN CATCH Performed at [facility], [address] [place], [place] [address]    Special Requests   Final    NONE Performed at [facility], [address], [place], [place] [address]    Culture (A)  Final    >=100,000 COLONIES/mL ESCHERICHIA COLI Confirmed Extended Spectrum  Beta-Lactamase Producer (ESBL).  In bloodstream infections from ESBL organisms, carbapenems are preferred over piperacillin/tazobactam. They are shown to have a lower risk of mortality.    Report Status 10/21/2023 FINAL  Final   Organism ID, Bacteria ESCHERICHIA COLI (A)  Final      Susceptibility   Escherichia coli - MIC*    AMPICILLIN >=32 RESISTANT Resistant     CEFAZOLIN >=64 RESISTANT Resistant     CEFEPIME 16 RESISTANT Resistant     CEFTRIAXONE  >=64 RESISTANT Resistant     CIPROFLOXACIN  <=0.25 SENSITIVE Sensitive     GENTAMICIN <=1 SENSITIVE Sensitive     IMIPENEM <=  0.25 SENSITIVE Sensitive     NITROFURANTOIN <=16 SENSITIVE Sensitive     TRIMETH/SULFA >=320 RESISTANT Resistant     AMPICILLIN/SULBACTAM 8 SENSITIVE Sensitive     PIP/TAZO <=4 SENSITIVE Sensitive ug/mL    * >=100,000 COLONIES/mL ESCHERICHIA COLI  Blood Culture (routine x 2)     Status: None (Preliminary result)   Collection Time: 10/19/23  6:31 PM   Specimen: Right Antecubital; Blood  Result Value Ref Range Status   Specimen Description RIGHT ANTECUBITAL  Final   Special Requests   Final    BOTTLES DRAWN AEROBIC AND ANAEROBIC Blood Culture adequate volume   Culture   Final    NO GROWTH 3 DAYS Performed at [facility], [address], [place], [place] [address]    Report Status PENDING  Incomplete         Radiology Studies: DG Tibia/Fibula Right Port Result Date: 10/20/2023 CLINICAL DATA:  [id number] Closed fracture of shaft of fibula with tibia, right, initial encounter [id number] Status post reduction. EXAM: PORTABLE RIGHT TIBIA AND FIBULA - 2 VIEW COMPARISON:  Tibia/fibular radiographs yesterday FINDINGS: Segmental tibial fracture, grossly stable alignment allowing for differences in technique. Fibular shaft fracture without significant interval change in alignment. The bones are diffusely under mineralized. Unchanged knee deformity. Generalized soft tissue edema. Splint material posteriorly.  IMPRESSION: 1. Segmental tibial and fibular fractures, grossly stable alignment allowing for differences in technique. 2. Osteoporosis. 3. Unchanged knee deformity. Electronically Signed   By: Chadwick Colonel M.D.   On: 10/20/2023 13:03        Scheduled Meds:  busPIRone   5 mg Oral TID   ciprofloxacin   500 mg Oral BID   vitamin B-12  1,000 mcg Oral Daily   docusate sodium   100 mg Oral BID   DULoxetine   60 mg Oral Daily   enoxaparin  (LOVENOX ) injection  40 mg Subcutaneous Q24H   gabapentin   300 mg Oral TID   insulin  aspart  0-6 Units Subcutaneous TID WC   insulin  glargine-yfgn  5 Units Subcutaneous Daily   letrozole   2.5 mg Oral Daily   nystatin    Topical BID   simvastatin   10 mg Oral q1800   sodium chloride  flush  3 mL Intravenous Q12H   umeclidinium bromide   1 puff Inhalation Daily   venlafaxine  XR  150 mg Oral Q breakfast   Continuous Infusions:     LOS: 2 days      Alphonsus Jeans, MD [facility] Pager [phone]  If 7PM-7AM, please contact night-coverage [email or web address]  10/22/2023, 8:12 AM

## 2023-10-22 NOTE — TOC Progression Note (Signed)
 Transition of Care [facility]) - Progression Note    Patient Details  Name: Melissa Mcdonald MRN: [id number] Date of Birth: [birth year]  [facility] CM/SW Contact  Alexandra Ice, RN Phone Number: 10/22/2023, 11:00 AM  Clinical Narrative:      Patient resides at [facility]. Patient has PASRR, FL2 pending MD signature.       Expected Discharge Plan and Services                                               Social Determinants of Health (SDOH) Interventions SDOH Screenings   Food Insecurity: Patient Unable To Answer (10/20/2023)  Housing: Patient Unable To Answer (10/20/2023)  Transportation Needs: Patient Unable To Answer (10/20/2023)  Utilities: Patient Unable To Answer (10/20/2023)  Financial Resource Strain: Low Risk  (07/30/2017)  Physical Activity: Unknown (07/30/2017)  Social Connections: Unknown (10/20/2023)  Recent Concern: Social Connections - Socially Isolated (08/20/2023)  Stress: No Stress Concern Present (07/30/2017)  Tobacco Use: Low Risk  (10/19/2023)    Readmission Risk Interventions     No data to display

## 2023-10-22 NOTE — Discharge Summary (Signed)
 Physician Discharge Summary  Melissa Mcdonald:[id number] DOB: 22-Jun-1940 DOA: 10/19/2023  PCP: Cole Daubs, [facility]  Admit date: 10/19/2023 Discharge date: 10/22/2023  Admitted From: [facility]  Disposition:  [facility]   Recommendations for Outpatient Follow-up:  Follow up with PCP in 1-2 weeks F/u w/ ortho surg, Dr. Clyda Dark, in 1 week Non weighting of right lower extremity   Home Health: no  Equipment/Devices:  Discharge Condition: stable  CODE STATUS: DNR Diet recommendation:Carb Modified   Brief/Interim Summary: 83 y/o F w/ PMH of CHF, HTN, HLD, depression, PVD, left leg amputation, breast cancer, CKD who presented w/ altered mental status x 5 days. Hx was obtained from pt and pt's family at bedside. Pt is a poor historian. Pt lives at [facility] for approx [facility] years. Pt's AMS has progressively become worse over the past 5 days. Pt was evidently tested for UTI at [facility] but was neg as per pt's daughter. Pt denies any fever, chills, sweating, chest pain, shortness of breath, nausea, vomiting, abd pain, diarrhea, or constipation.   Discharge Diagnoses:  Principal Problem:   UTI (urinary tract infection) Active Problems:   Closed fracture of shaft of right tibia   UTI: UA was positive. Urine cx growing e. coli. Continue on cipro  x 3 days more. Hx of UTIs as per pt's daughter   Acute encephalopathy: possibly secondary to UTI vs delirium vs dementia. No hx of dementia as per pt's daughter. Continue on abxs    Right leg fractures: secondary to fall at [facility]. No surg as per ortho surg. Continue w/ splint as per ortho surg. Will need to f/u outpatient w/ ortho surg in 1 week to get measured for custom brace as per ortho surg    HTN: restart home dose of entresto , jardiance, torsemide      HLD: continue on statin    Depression: severity unknown. Continue on buspar , duloxetine , venlafaxine      Chronic diastolic CHF: appears compensated. Monitor  I/Os. Restart home dose of entresto , torsemide , & jardiance.    DM2: likely poorly controlled. 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 Cr is trending down daily    Peripheral neuropathy: continue on home dose of gabapentin     Hx of breast cancer: continue on home dose of letrozole     Hx of COPD: w/o acute exacerbation. Continue on bronchodilators   PVD: w/ hx of left AKA. Continue on statin. Continue w/ supportive care. Uses a wheelchair but mostly bedbound    Macrocytic anemia: likely secondary to B12 deficiency. S/p B12 IM x 1. Continue w/ B12 supplements    OSA: CPAP qhs    Discharge Instructions  Discharge Instructions     Diet Carb Modified   Complete by: As directed    Discharge instructions   Complete by: As directed    F/u w/ ortho surg, Dr. Clyda Dark, in 1 week. Non-weight bearing to right lower extremity. F/u w/ PCP in 1-2 weeks   Increase activity slowly   Complete by: As directed       Allergies as of 10/22/2023       Reactions   Pantoprazole Sodium Diarrhea   Aleve [naproxen Sodium] Swelling   Iron Nausea And Vomiting   "Oral Iron" per patient        Medication List     STOP taking these medications    albuterol  108 (90 Base) MCG/ACT inhaler Commonly known as: VENTOLIN  HFA  Biofreeze Cool The Pain 4 % Gel Generic drug: Menthol (Topical Analgesic)   budesonide-formoterol  160-4.5 MCG/ACT inhaler Commonly known as: SYMBICORT   Dextromethorphan-Benzocaine 5-7.5 MG Lozg   Gerhardt's butt cream Crea   Hypromellose 0.4 % Soln   insulin  glargine-yfgn 100 UNIT/ML Pen Commonly known as: SEMGLEE    insulin  lispro 100 UNIT/ML KwikPen Commonly known as: HUMALOG   ipratropium-albuterol  0.5-2.5 (3) MG/3ML Soln Commonly known as: DUONEB   lidocaine  1 % (with preservative) injection Commonly known as: XYLOCAINE    Refresh Liquigel 1 % Gel Generic drug: Carboxymethylcellulose Sodium   tiotropium  18 MCG inhalation capsule Commonly known as: SPIRIVA        TAKE these medications    acetaminophen  325 MG tablet Commonly known as: TYLENOL  Take 650 mg by mouth at bedtime as needed.   allopurinol  300 MG tablet Commonly known as: ZYLOPRIM  Take 300 mg by mouth daily.   ALPRAZolam  0.25 MG tablet Commonly known as: XANAX  Take 1 tablet (0.25 mg total) by mouth at bedtime as needed for up to 1 dose for anxiety or sleep.   Artificial Tears 0.5-0.6 % Soln Generic drug: Polyvinyl Alcohol-Povidone Place 1 drop into both eyes at bedtime.   busPIRone  5 MG tablet Commonly known as: BUSPAR  Take 5 mg by mouth 3 (three) times daily.   ciprofloxacin  500 MG tablet Commonly known as: CIPRO  Take 1 tablet (500 mg total) by mouth 2 (two) times daily for 3 days.   cyanocobalamin  1000 MCG tablet Take 1 tablet (1,000 mcg total) by mouth daily. Start taking on: Oct 23, 2023   desonide 0.05 % cream Commonly known as: DESOWEN Apply 1 Application topically 2 (two) times daily.   DULoxetine  60 MG capsule Commonly known as: CYMBALTA  Take 60 mg by mouth daily.   Entresto  49-51 MG Generic drug: sacubitril -valsartan  Take 1 tablet by mouth 2 (two) times daily.   fluconazole 150 MG tablet Commonly known as: DIFLUCAN Take 150 mg by mouth once a week. On Mondays   fluticasone-salmeterol 250-50 MCG/ACT Aepb Commonly known as: ADVAIR Inhale 1 puff into the lungs 2 (two) times daily.   gabapentin  300 MG capsule Commonly known as: NEURONTIN  Take 300 mg by mouth 3 (three) times daily.   guaifenesin  100 MG/5ML syrup Commonly known as: ROBITUSSIN Take 10 mLs by mouth every 8 (eight) hours as needed for cough or congestion.   HYDROcodone -acetaminophen  7.5-325 MG tablet Commonly known as: NORCO Take 1 tablet by mouth every 4 (four) hours as needed for up to 2 days for moderate pain (pain score 4-6) or severe pain (pain score 7-10). For use at white [employer] only.  Refills per facility MD What  changed: when to take this   hydrocortisone  cream 1 % Apply 1 Application topically 2 (two) times daily as needed for itching.   hydrOXYzine 10 MG tablet Commonly known as: ATARAX Take 10 mg by mouth every 4 (four) hours as needed.   Incruse Ellipta  62.5 MCG/ACT Aepb Generic drug: umeclidinium bromide  Inhale 1 puff into the lungs daily.   insulin  glargine 100 UNIT/ML injection Commonly known as: LANTUS  Inject 0.12 mLs (12 Units total) into the skin at bedtime.   Jardiance 10 MG Tabs tablet Generic drug: empagliflozin Take 10 mg by mouth daily.   letrozole  2.5 MG tablet Commonly known as: Femara  Take 1 tablet (2.5 mg total) by mouth daily.   lidocaine  5 % ointment Commonly known as: XYLOCAINE  Apply 1 Application topically 3 (three) times daily as needed for mild pain (pain score 1-3).  metoprolol  tartrate 50 MG tablet Commonly known as: LOPRESSOR  Take 50 mg by mouth daily.   nystatin  powder Apply 1 Application topically 2 (two) times daily.   senna 8.6 MG Tabs tablet Commonly known as: SENOKOT Take 2 tablets by mouth daily.   simvastatin  10 MG tablet Commonly known as: ZOCOR  Take 10 mg by mouth at bedtime.   torsemide  20 MG tablet Commonly known as: DEMADEX  Take 20 mg by mouth daily.   venlafaxine  XR 150 MG 24 hr capsule Commonly known as: EFFEXOR -XR Take 150 mg by mouth daily.   Vitamin D3 125 MCG (5000 UT) Caps Take 1 capsule by mouth daily. What changed: Another medication with the same name was removed. Continue taking this medication, and follow the directions you see here.        Follow-up Information     Venus Ginsberg, MD Follow up.   Specialty: Orthopedic Surgery Why: F/u in 1 week Contact information: [address] [place] [address] [phone]         [facility] Follow up.   Specialty: Geriatric Medicine Why: F/u in 1-2 weeks Contact information: [address] [place]  [address] [phone]                Allergies  Allergen Reactions   Pantoprazole Sodium Diarrhea   Aleve [Naproxen Sodium] Swelling   Iron Nausea And Vomiting    "Oral Iron" per patient    Consultations: Ortho surg    Procedures/Studies: DG Tibia/Fibula Right Port Result Date: 10/20/2023 CLINICAL DATA:  [id number] Closed fracture of shaft of fibula with tibia, right, initial encounter [id number] Status post reduction. EXAM: PORTABLE RIGHT TIBIA AND FIBULA - 2 VIEW COMPARISON:  Tibia/fibular radiographs yesterday FINDINGS: Segmental tibial fracture, grossly stable alignment allowing for differences in technique. Fibular shaft fracture without significant interval change in alignment. The bones are diffusely under mineralized. Unchanged knee deformity. Generalized soft tissue edema. Splint material posteriorly. IMPRESSION: 1. Segmental tibial and fibular fractures, grossly stable alignment allowing for differences in technique. 2. Osteoporosis. 3. Unchanged knee deformity. Electronically Signed   By: Chadwick Colonel M.D.   On: 10/20/2023 13:03   DG Tibia/Fibula Right Result Date: 10/19/2023 CLINICAL DATA:  Right leg pain after fall at living facility. Fall 5 days ago. EXAM: RIGHT TIBIA AND FIBULA - 2 VIEW COMPARISON:  Right femur, tibia and fibula radiographs 12/02/2013 FINDINGS: There is diffuse decreased bone mineralization. There is a new oblique fracture of the proximal tibial diaphysis with up to approximately 8 mm lateral displacement of the distal fracture component with respect to the proximal fracture component. Within the mid fibular diaphysis closer to the mid height, there is a transverse fracture with approximately 5 mm lateral displacement of the distal fracture component. There is an additional lucent spiral fracture line within the distal tibial metadiaphysis, an acute nondisplaced fracture. There is some similar lucency within the distal fibular diaphysis and metaphysis that is  favored to represent low bone mineralization, however it is difficult to entirely exclude additional acute fracture. Moderate tibiotalar joint space narrowing and subchondral sclerosis. Interval removal of prior total knee arthroplasty hardware, noting previously there was dislocation of this hardware and osteolysis of the adjacent distal femur on 12/02/2013 remote radiographs. 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 There is also osteolysis of the proximal tibia. There are likely postsurgical densities seen throughout the distal femoral-proximal tibial pseudoarthrosis. IMPRESSION: 1. Markedly decreased bone mineralization. 2. Acute  oblique fracture of the proximal tibial diaphysis with up to 8 mm lateral displacement of the distal fracture component with respect to the proximal fracture component. 3. Acute transverse fracture of the mid fibular diaphysis with approximately 5 mm lateral displacement of the distal fracture component. 4. Acute nondisplaced spiral fracture of the distal tibial metadiaphysis. 5. Similar lucency within the distal fibular diaphysis and metaphysis that is favored to represent low bone mineralization, however it is difficult to entirely exclude additional acute fracture. 6. Interval removal of prior total knee arthroplasty hardware compared to 2015 comparison radiographs. 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 Electronically Signed   By: Bertina Broccoli M.D.   On: 10/19/2023 19:54   DG Chest Port 1 View Result Date: 10/19/2023 CLINICAL DATA:  Questionable sepsis. EXAM: PORTABLE CHEST 1 VIEW COMPARISON:  Chest radiograph dated 08/20/2023. FINDINGS: Mild cardiomegaly with mild central vascular congestion. No focal consolidation, pleural effusion or pneumothorax. Atherosclerotic calcification of the aorta. No acute osseous pathology. IMPRESSION: Mild cardiomegaly with mild central vascular  congestion. No focal consolidation. Electronically Signed   By: Angus Bark M.D.   On: 10/19/2023 13:31   (Echo, Carotid, EGD, Colonoscopy, ERCP)    Subjective: Pt c/o leg pain    Discharge Exam: Vitals:   10/22/23 0738 10/22/23 0738  BP: (!) 118/53 (!) 118/53  Pulse: 73 74  Resp: 16 16  Temp: 98.8 F (37.1 C) 98.8 F (37.1 C)  SpO2: 96% 98%   Vitals:   10/21/23 2023 10/22/23 0429 10/22/23 0738 10/22/23 0738  BP: (!) 106/56 (!) 104/54 (!) 118/53 (!) 118/53  Pulse: 84 73 73 74  Resp: 18 18 16 16   Temp: 98.1 F (36.7 C) (!) 97.4 F (36.3 C) 98.8 F (37.1 C) 98.8 F (37.1 C)  TempSrc:  Oral    SpO2: 97% 98% 96% 98%  Weight:      Height:        General: Pt is alert, awake, not in acute distress Cardiovascular: S1/S2 +, no rubs, no gallops Respiratory: decreased breath sounds b/l  Abdominal: Soft, NT, obese, bowel sounds + Extremities: no cyanosis. RLE is in a splint     The results of significant diagnostics from this hospitalization (including imaging, microbiology, ancillary and laboratory) are listed below for reference.     Microbiology: Recent Results (from the past 240 hours)  Blood Culture (routine x 2)     Status: None (Preliminary result)   Collection Time: 10/19/23 12:38 PM   Specimen: BLOOD  Result Value Ref Range Status   Specimen Description BLOOD RIGHT ANTECUBITAL  Final   Special Requests   Final    BOTTLES DRAWN AEROBIC AND ANAEROBIC Blood Culture adequate volume   Culture   Final    NO GROWTH 3 DAYS Performed at [facility], [address], [place], [place] [address]    Report Status PENDING  Incomplete  Urine Culture     Status: Abnormal   Collection Time: 10/19/23 12:38 PM   Specimen: Urine, Clean Catch  Result Value Ref Range Status   Specimen Description   Final    URINE, CLEAN CATCH Performed at [facility], [address] [place], [place] [address]    Special Requests   Final    NONE Performed at  [facility], [address], [place], [place] [address]    Culture (A)  Final    >=100,000 COLONIES/mL ESCHERICHIA COLI Confirmed Extended Spectrum Beta-Lactamase Producer (ESBL).  In  bloodstream infections from ESBL organisms, carbapenems are preferred over piperacillin/tazobactam. They are shown to have a lower risk of mortality.    Report Status 10/21/2023 FINAL  Final   Organism ID, Bacteria ESCHERICHIA COLI (A)  Final      Susceptibility   Escherichia coli - MIC*    AMPICILLIN >=32 RESISTANT Resistant     CEFAZOLIN >=64 RESISTANT Resistant     CEFEPIME 16 RESISTANT Resistant     CEFTRIAXONE  >=64 RESISTANT Resistant     CIPROFLOXACIN  <=0.25 SENSITIVE Sensitive     GENTAMICIN <=1 SENSITIVE Sensitive     IMIPENEM <=0.25 SENSITIVE Sensitive     NITROFURANTOIN <=16 SENSITIVE Sensitive     TRIMETH/SULFA >=320 RESISTANT Resistant     AMPICILLIN/SULBACTAM 8 SENSITIVE Sensitive     PIP/TAZO <=4 SENSITIVE Sensitive ug/mL    * >=100,000 COLONIES/mL ESCHERICHIA COLI  Blood Culture (routine x 2)     Status: None (Preliminary result)   Collection Time: 10/19/23  6:31 PM   Specimen: Right Antecubital; Blood  Result Value Ref Range Status   Specimen Description RIGHT ANTECUBITAL  Final   Special Requests   Final    BOTTLES DRAWN AEROBIC AND ANAEROBIC Blood Culture adequate volume   Culture   Final    NO GROWTH 3 DAYS Performed at [facility], [address] [place], [place] [address]    Report Status PENDING  Incomplete     Labs: BNP (last 3 results) Recent Labs    08/20/23 1308  BNP 47.4   Basic Metabolic Panel: Recent Labs  Lab 10/19/23 1238 10/20/23 0417 10/21/23 0451 10/22/23 0240  NA 138 140 136 138  K 3.9 3.5 3.6 3.7  CL 99 105 101 107  CO2 29 26 26 25   GLUCOSE 118* 92 110* 106*  BUN 27* 26* 19 18  CREATININE 1.50* 1.38* 1.18* 0.93  CALCIUM  8.8* 8.8* 8.7* 8.5*   Liver Function Tests: Recent Labs  Lab 10/19/23 1238  AST 13*  ALT 14   ALKPHOS 70  BILITOT 0.7  PROT 6.9  ALBUMIN 3.3*   No results for input(s): "LIPASE", "AMYLASE" in the last 168 hours. No results for input(s): "AMMONIA" in the last 168 hours. CBC: Recent Labs  Lab 10/19/23 1238 10/20/23 0417 10/21/23 0451 10/22/23 0240  WBC 9.9 10.0 9.2 9.3  NEUTROABS 6.8  --   --   --   HGB 11.8* 11.1* 11.5* 10.6*  HCT 36.4 34.1* 35.1* 32.5*  MCV 100.3* 99.1 100.0 100.3*  PLT 264 253 258 247   Cardiac Enzymes: No results for input(s): "CKTOTAL", "CKMB", "CKMBINDEX", "TROPONINI" in the last 168 hours. BNP: Invalid input(s): "POCBNP" CBG: Recent Labs  Lab 10/21/23 1153 10/21/23 1652 10/21/23 2255 10/22/23 0740 10/22/23 1135  GLUCAP 144* 131* 125* 87 101*   D-Dimer No results for input(s): "DDIMER" in the last 72 hours. Hgb A1c No results for input(s): "HGBA1C" in the last 72 hours. Lipid Profile No results for input(s): "CHOL", "HDL", "LDLCALC", "TRIG", "CHOLHDL", "LDLDIRECT" in the last 72 hours. Thyroid  function studies No results for input(s): "TSH", "T4TOTAL", "T3FREE", "THYROIDAB" in the last 72 hours.  Invalid input(s): "FREET3" Anemia work up Recent Labs    10/20/23 0417  VITAMINB12 271  FOLATE 7.7   Urinalysis    Component Value Date/Time   COLORURINE YELLOW (A) 10/19/2023 1238   APPEARANCEUR TURBID (A) 10/19/2023 1238   APPEARANCEUR Clear 11/01/2012 0935   LABSPEC 1.009 10/19/2023 1238   LABSPEC 1.021 11/01/2012 0935   PHURINE 5.0 10/19/2023  1238   GLUCOSEU >=500 (A) 10/19/2023 1238   GLUCOSEU Negative 11/01/2012 0935   HGBUR SMALL (A) 10/19/2023 1238   BILIRUBINUR NEGATIVE 10/19/2023 1238   BILIRUBINUR Negative 11/01/2012 0935   KETONESUR NEGATIVE 10/19/2023 1238   PROTEINUR 30 (A) 10/19/2023 1238   NITRITE NEGATIVE 10/19/2023 1238   LEUKOCYTESUR LARGE (A) 10/19/2023 1238   LEUKOCYTESUR Negative 11/01/2012 0935   Sepsis Labs Recent Labs  Lab 10/19/23 1238 10/20/23 0417 10/21/23 0451 10/22/23 0240  WBC 9.9 10.0  9.2 9.3   Microbiology Recent Results (from the past 240 hours)  Blood Culture (routine x 2)     Status: None (Preliminary result)   Collection Time: 10/19/23 12:38 PM   Specimen: BLOOD  Result Value Ref Range Status   Specimen Description BLOOD RIGHT ANTECUBITAL  Final   Special Requests   Final    BOTTLES DRAWN AEROBIC AND ANAEROBIC Blood Culture adequate volume   Culture   Final    NO GROWTH 3 DAYS Performed at [facility], [address], [place], [place] [address]    Report Status PENDING  Incomplete  Urine Culture     Status: Abnormal   Collection Time: 10/19/23 12:38 PM   Specimen: Urine, Clean Catch  Result Value Ref Range Status   Specimen Description   Final    URINE, CLEAN CATCH Performed at [facility], [address], [place], [place] [address]    Special Requests   Final    NONE Performed at [facility], [address] [place], [place] [address]    Culture (A)  Final    >=100,000 COLONIES/mL ESCHERICHIA COLI Confirmed Extended Spectrum Beta-Lactamase Producer (ESBL).  In bloodstream infections from ESBL organisms, carbapenems are preferred over piperacillin/tazobactam. They are shown to have a lower risk of mortality.    Report Status 10/21/2023 FINAL  Final   Organism ID, Bacteria ESCHERICHIA COLI (A)  Final      Susceptibility   Escherichia coli - MIC*    AMPICILLIN >=32 RESISTANT Resistant     CEFAZOLIN >=64 RESISTANT Resistant     CEFEPIME 16 RESISTANT Resistant     CEFTRIAXONE  >=64 RESISTANT Resistant     CIPROFLOXACIN  <=0.25 SENSITIVE Sensitive     GENTAMICIN <=1 SENSITIVE Sensitive     IMIPENEM <=0.25 SENSITIVE Sensitive     NITROFURANTOIN <=16 SENSITIVE Sensitive     TRIMETH/SULFA >=320 RESISTANT Resistant     AMPICILLIN/SULBACTAM 8 SENSITIVE Sensitive     PIP/TAZO <=4 SENSITIVE Sensitive ug/mL    * >=100,000 COLONIES/mL ESCHERICHIA COLI  Blood Culture (routine x 2)     Status: None (Preliminary result)    Collection Time: 10/19/23  6:31 PM   Specimen: Right Antecubital; Blood  Result Value Ref Range Status   Specimen Description RIGHT ANTECUBITAL  Final   Special Requests   Final    BOTTLES DRAWN AEROBIC AND ANAEROBIC Blood Culture adequate volume   Culture   Final    NO GROWTH 3 DAYS Performed at [facility], [address] [place], [place] [address]    Report Status PENDING  Incomplete     Time coordinating discharge: Over 30 minutes  SIGNED:   Alphonsus Jeans, MD  [facility] 10/22/2023, 2:53 PM Pager   If 7PM-7AM, please contact night-coverage [email or web address]

## 2023-10-24 LAB — CULTURE, BLOOD (ROUTINE X 2)
Culture: NO GROWTH
Culture: NO GROWTH
Special Requests: ADEQUATE
Special Requests: ADEQUATE

## 2024-02-04 DEATH — deceased
